# Patient Record
Sex: Female | Born: 1965
Health system: Southern US, Community
[De-identification: ages and names within clinical notes are randomized; demographics above are authoritative.]

## PROBLEM LIST (undated history)

## (undated) DIAGNOSIS — E042 Nontoxic multinodular goiter: Secondary | ICD-10-CM

## (undated) DIAGNOSIS — E049 Nontoxic goiter, unspecified: Secondary | ICD-10-CM

## (undated) DIAGNOSIS — G8929 Other chronic pain: Secondary | ICD-10-CM

## (undated) DIAGNOSIS — K625 Hemorrhage of anus and rectum: Secondary | ICD-10-CM

## (undated) DIAGNOSIS — T7840XA Allergy, unspecified, initial encounter: Secondary | ICD-10-CM

## (undated) DIAGNOSIS — T8859XA Other complications of anesthesia, initial encounter: Secondary | ICD-10-CM

## (undated) DIAGNOSIS — Z87898 Personal history of other specified conditions: Secondary | ICD-10-CM

## (undated) DIAGNOSIS — T4145XA Adverse effect of unspecified anesthetic, initial encounter: Secondary | ICD-10-CM

## (undated) DIAGNOSIS — K602 Anal fissure, unspecified: Secondary | ICD-10-CM

## (undated) DIAGNOSIS — M545 Low back pain: Secondary | ICD-10-CM

## (undated) DIAGNOSIS — Z973 Presence of spectacles and contact lenses: Secondary | ICD-10-CM

## (undated) HISTORY — PX: OTHER SURGICAL HISTORY: SHX169

## (undated) HISTORY — DX: Nontoxic multinodular goiter: E04.2

## (undated) HISTORY — DX: Nontoxic goiter, unspecified: E04.9

## (undated) HISTORY — DX: Low back pain: M54.5

## (undated) HISTORY — DX: Anal fissure, unspecified: K60.2

## (undated) HISTORY — DX: Allergy, unspecified, initial encounter: T78.40XA

---
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
## 2000-09-24 DIAGNOSIS — E049 Nontoxic goiter, unspecified: Secondary | ICD-10-CM

## 2000-09-24 HISTORY — DX: Nontoxic goiter, unspecified: E04.9

## 2003-05-07 ENCOUNTER — Encounter: Payer: Self-pay | Admitting: Family Medicine

## 2003-05-07 ENCOUNTER — Encounter: Admission: RE | Admit: 2003-05-07 | Discharge: 2003-05-07 | Payer: Self-pay | Admitting: Family Medicine

## 2004-06-01 ENCOUNTER — Other Ambulatory Visit: Admission: RE | Admit: 2004-06-01 | Discharge: 2004-06-01 | Payer: Self-pay | Admitting: Family Medicine

## 2004-06-07 ENCOUNTER — Encounter: Admission: RE | Admit: 2004-06-07 | Discharge: 2004-06-07 | Payer: Self-pay | Admitting: Family Medicine

## 2004-09-24 HISTORY — PX: TEAR DUCT PROBING: SHX793

## 2005-03-29 ENCOUNTER — Other Ambulatory Visit: Admission: RE | Admit: 2005-03-29 | Discharge: 2005-03-29 | Payer: Self-pay | Admitting: Family Medicine

## 2006-02-27 ENCOUNTER — Other Ambulatory Visit: Admission: RE | Admit: 2006-02-27 | Discharge: 2006-02-27 | Payer: Self-pay | Admitting: Family Medicine

## 2006-02-27 ENCOUNTER — Encounter: Admission: RE | Admit: 2006-02-27 | Discharge: 2006-02-27 | Payer: Self-pay | Admitting: Family Medicine

## 2006-02-27 IMAGING — CR DG SHOULDER 2+V*R*
3 series · 3 of 3 positions shown · non-contrast
Comparison: none

CLINICAL DATA: Right shoulder pain.
 THREE VIEWS, RIGHT SHOULDER:
 Mild AC joint degenerative change.  Glenohumeral alignment anatomic.  The glenohumeral articulation is unremarkable.

[view not recorded (1 of 3)]
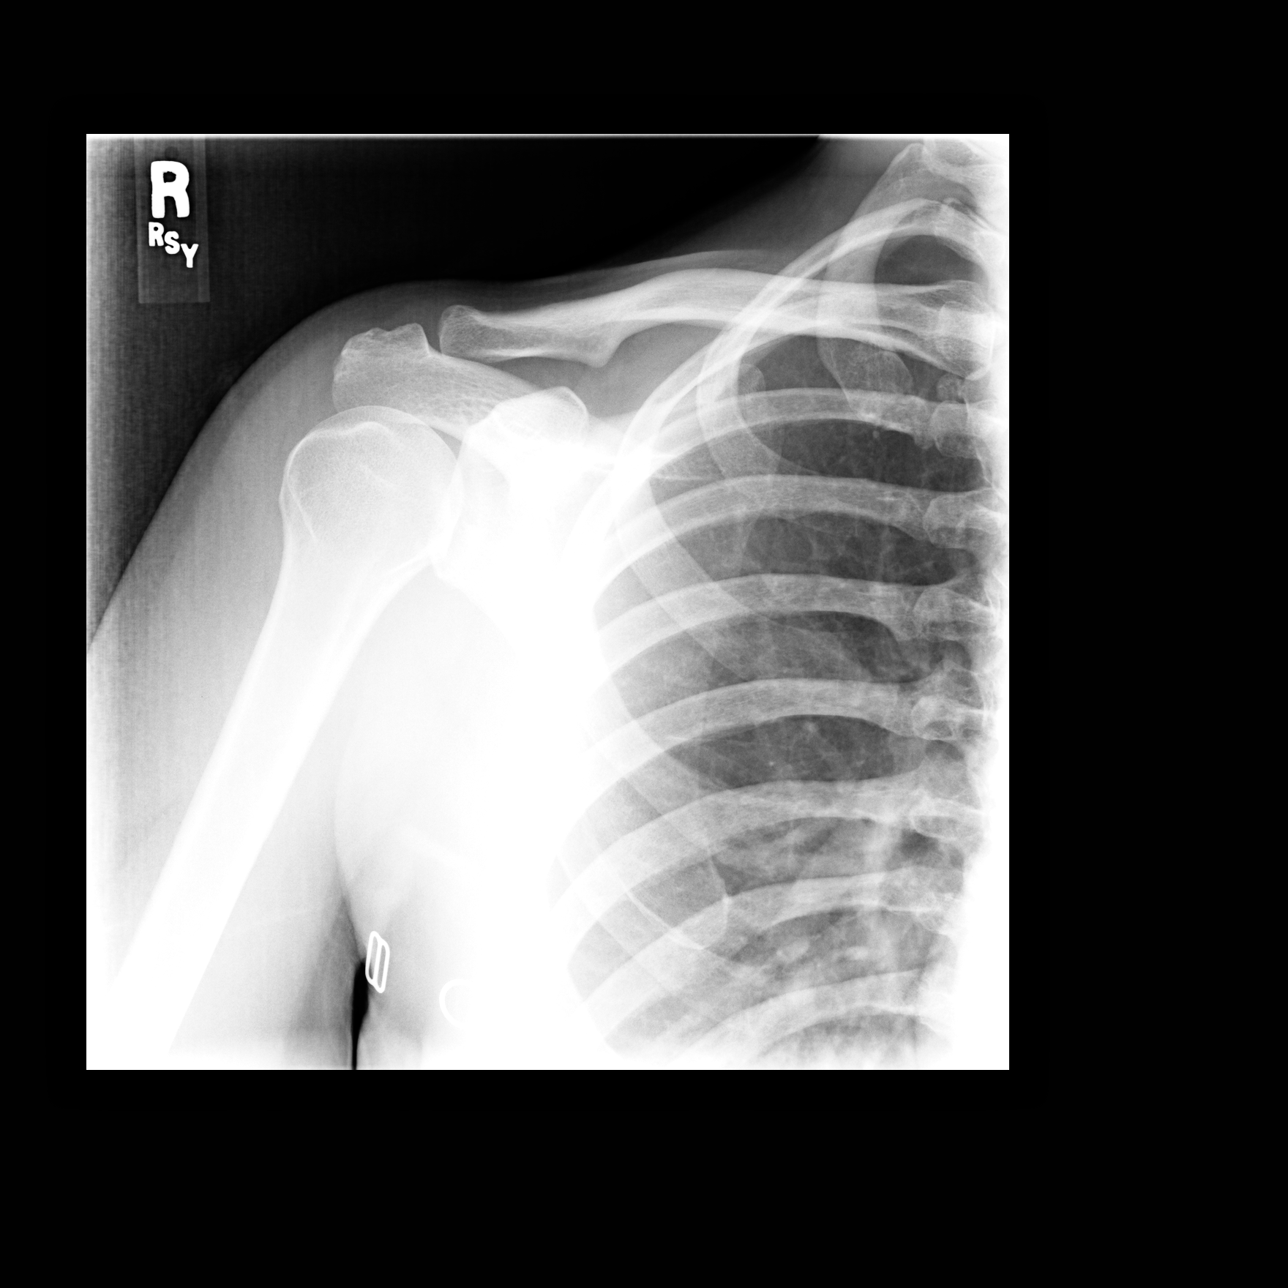

[view not recorded (2 of 3)]
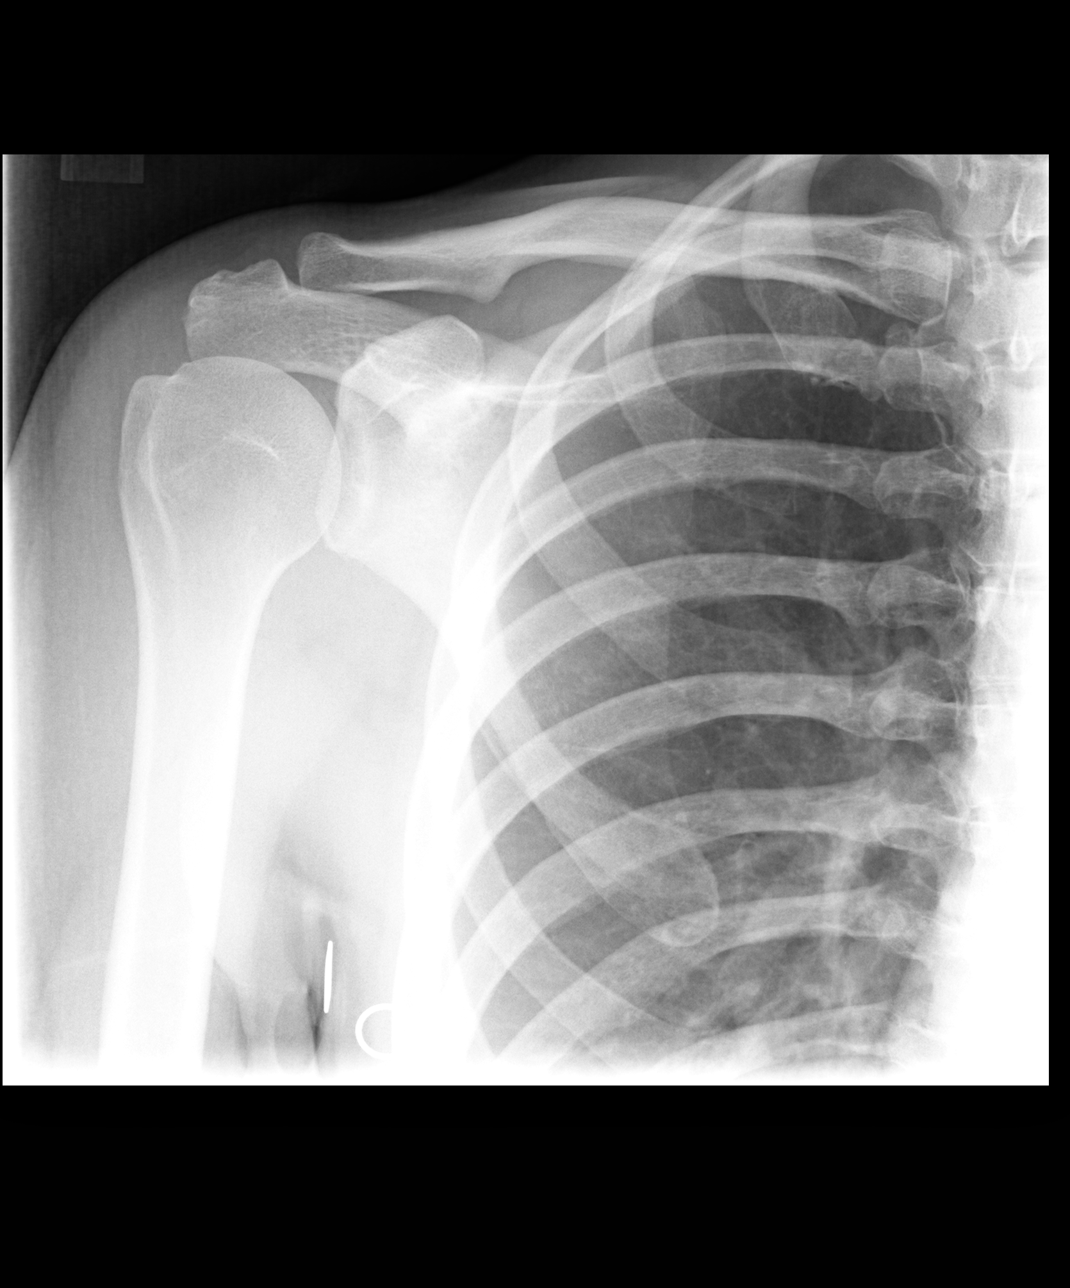

[view not recorded (3 of 3)]
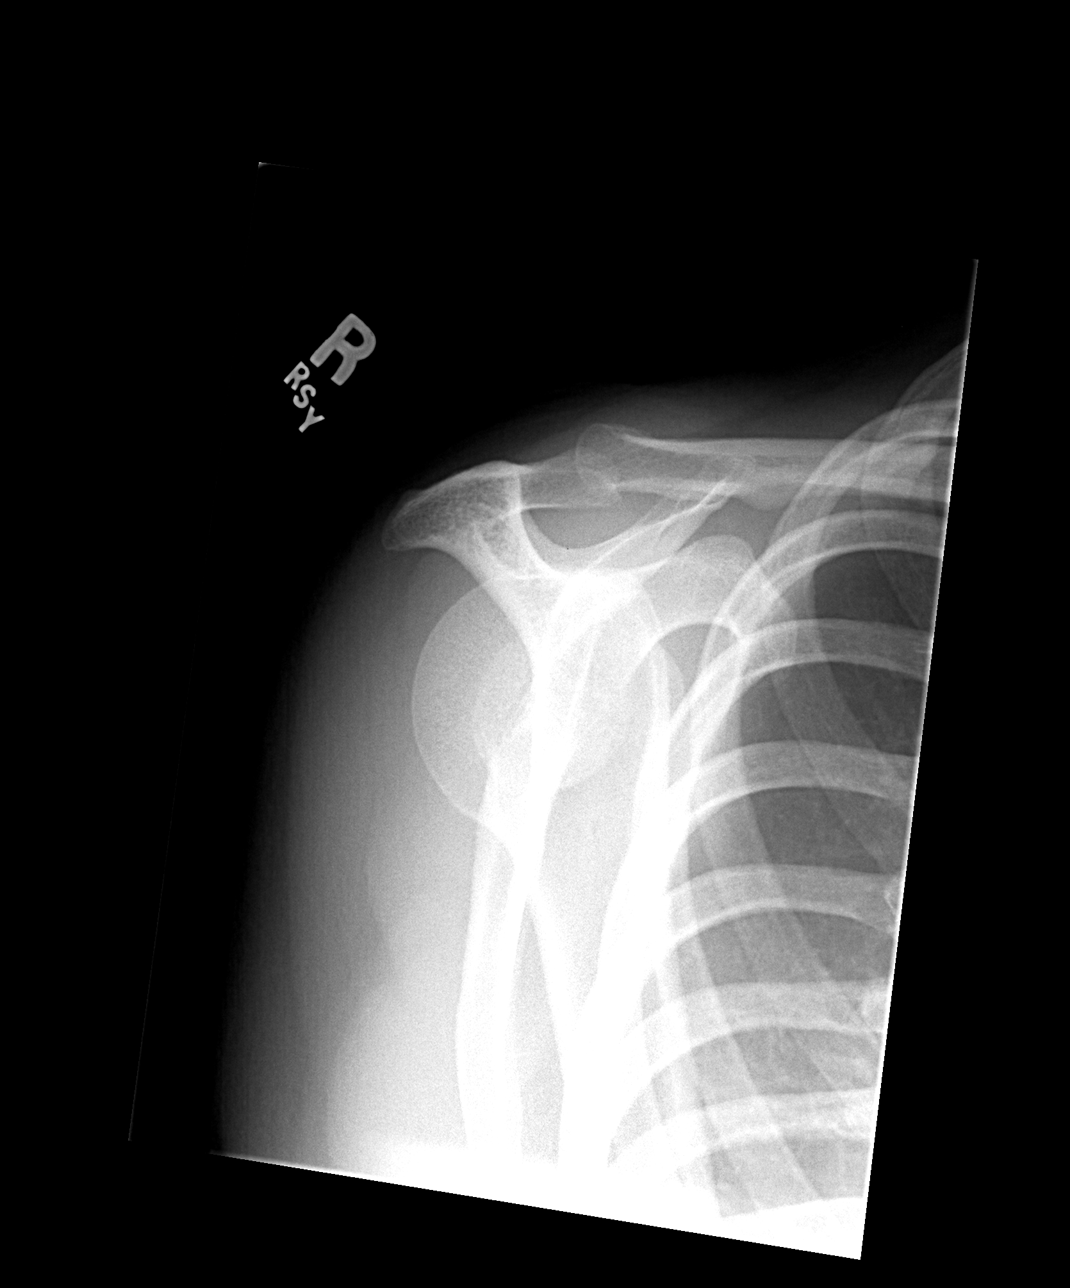

[3 of 3 positions shown; findings below may reference images not displayed]

IMPRESSION: AC joint degenerative change.

## 2006-03-06 ENCOUNTER — Encounter: Admission: RE | Admit: 2006-03-06 | Discharge: 2006-06-04 | Payer: Self-pay | Admitting: Family Medicine

## 2006-03-15 ENCOUNTER — Encounter: Admission: RE | Admit: 2006-03-15 | Discharge: 2006-03-15 | Payer: Self-pay | Admitting: Family Medicine

## 2007-02-25 ENCOUNTER — Other Ambulatory Visit: Admission: RE | Admit: 2007-02-25 | Discharge: 2007-02-25 | Payer: Self-pay | Admitting: Family Medicine

## 2007-03-17 ENCOUNTER — Encounter: Admission: RE | Admit: 2007-03-17 | Discharge: 2007-03-17 | Payer: Self-pay | Admitting: Family Medicine

## 2008-03-17 ENCOUNTER — Encounter: Admission: RE | Admit: 2008-03-17 | Discharge: 2008-03-17 | Payer: Self-pay | Admitting: Family Medicine

## 2008-03-17 IMAGING — US US SOFT TISSUE HEAD/NECK
1 series · 14 of 25 positions shown · non-contrast
Comparison: None

CLINICAL DATA: Evaluate for multinodular goiter

THYROID ULTRASOUND
TECHNIQUE: Ultrasound examination of the thyroid gland and all
adjacent soft tissues was performed.

[Series 1: us soft tissue head/neck · 0.08mm/px · 14 of 47 slices shown]
[im 1/47]
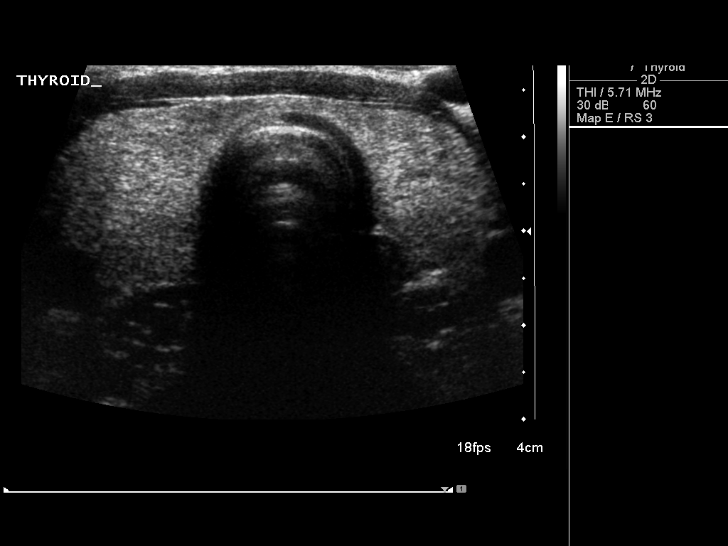
[im 4/47]
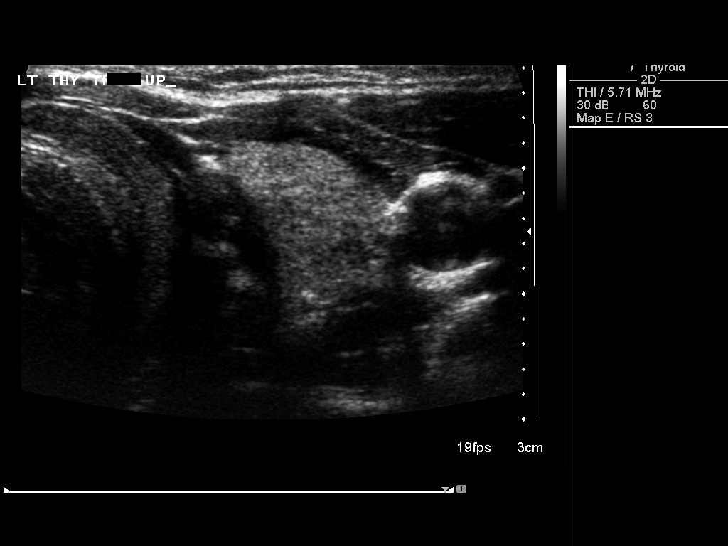
[im 8/47]
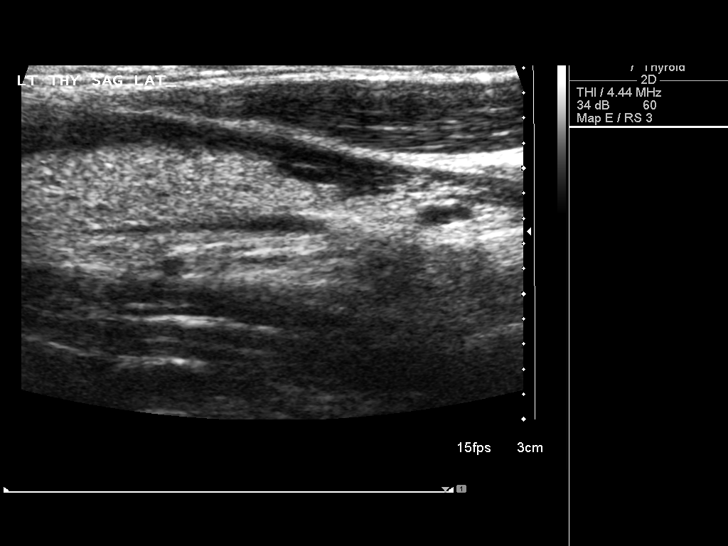
[im 12/47]
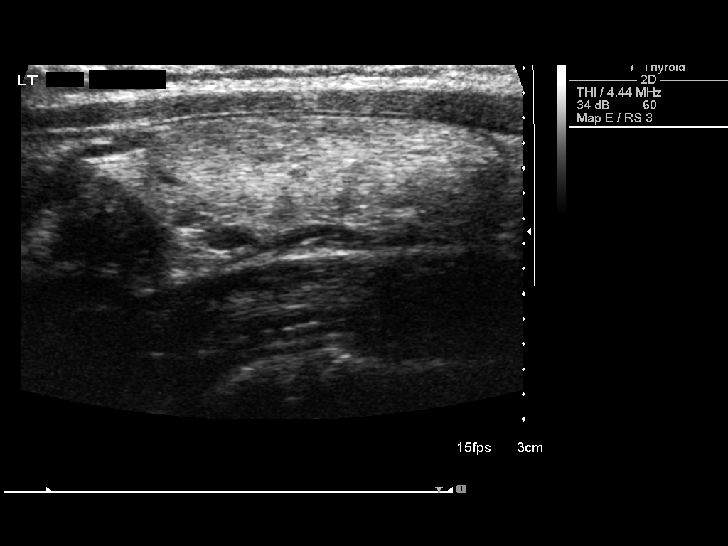
[im 16/47]
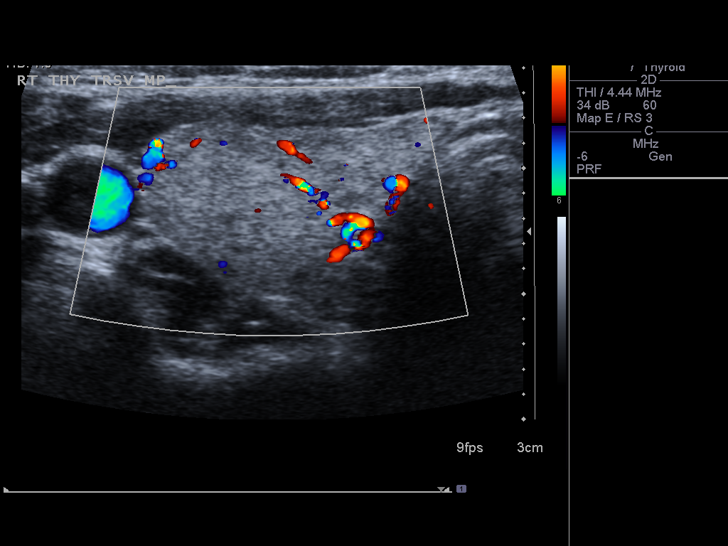
[im 18/47]
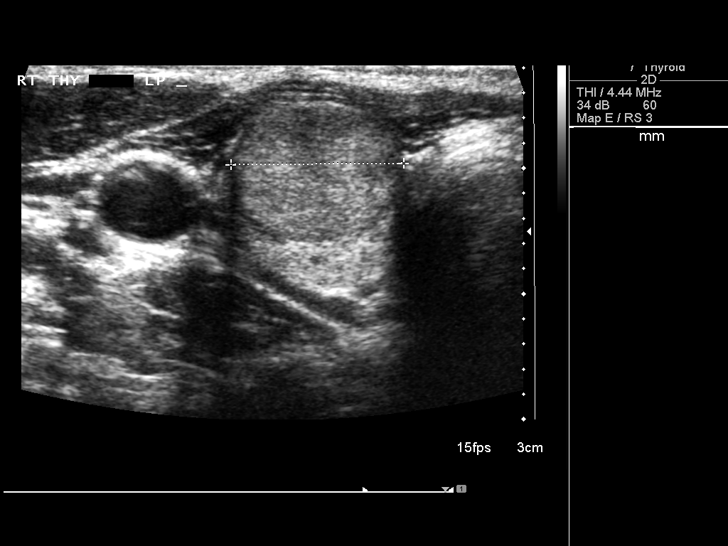
[im 22/47]
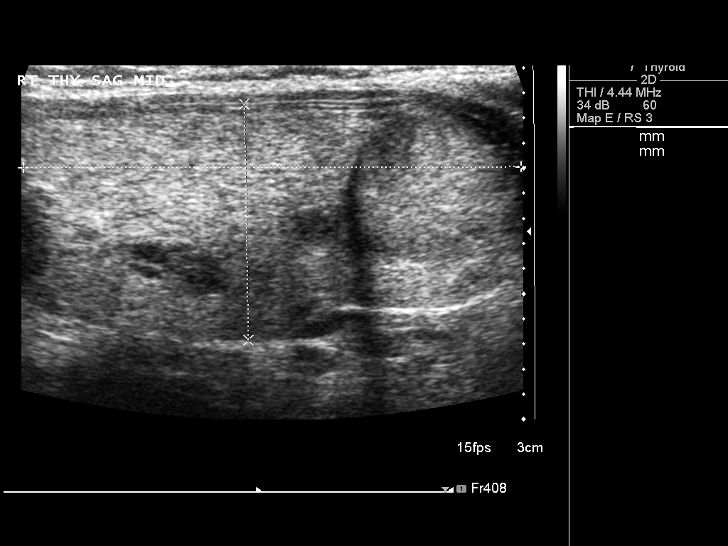
[im 25/47]
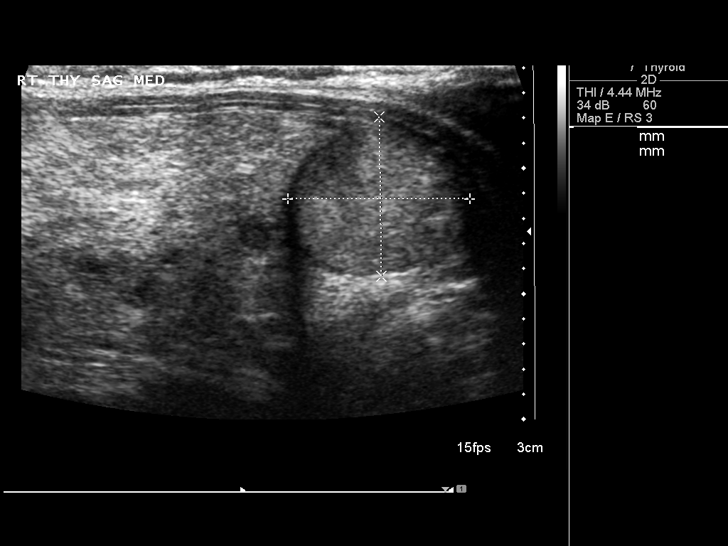
[im 29/47]
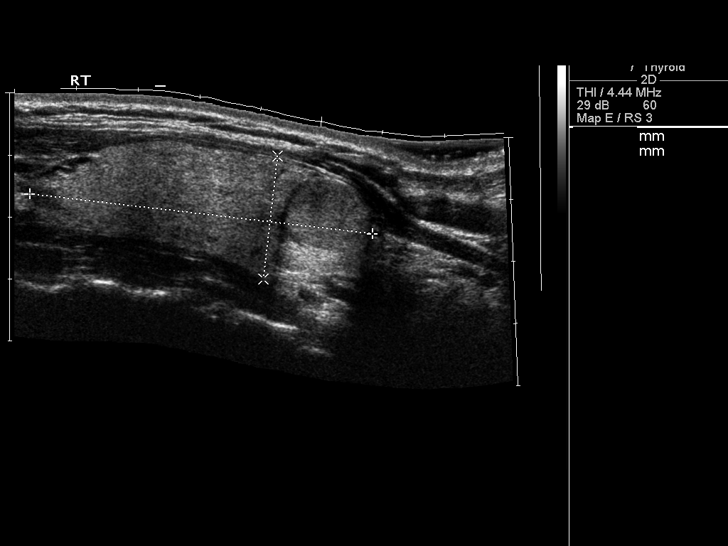
[im 31/47]
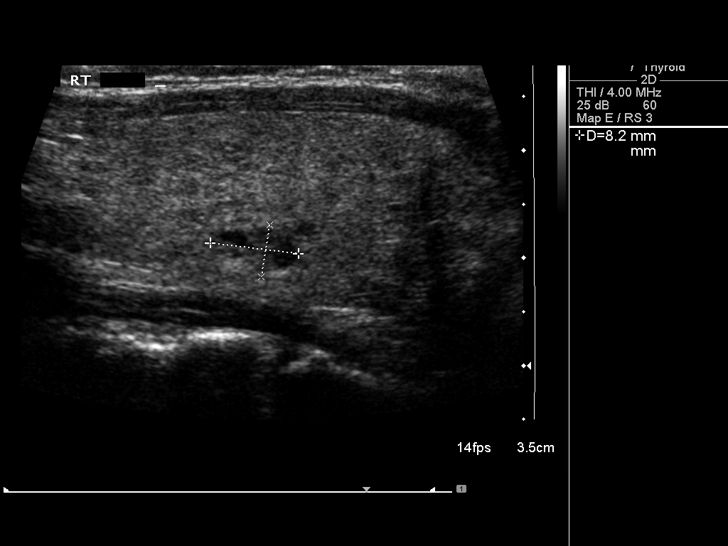
[im 35/47]
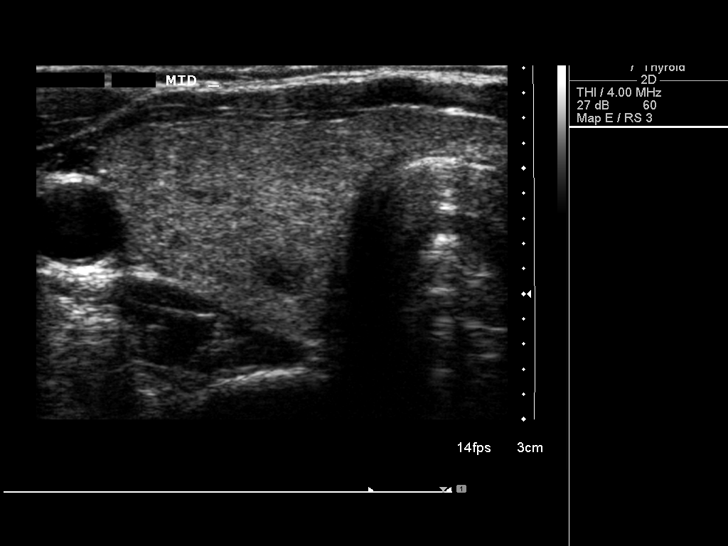
[im 39/47]
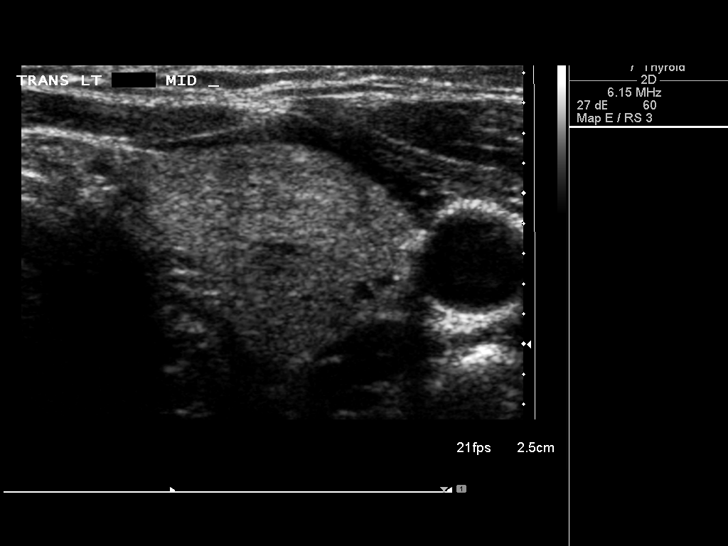
[im 43/47]
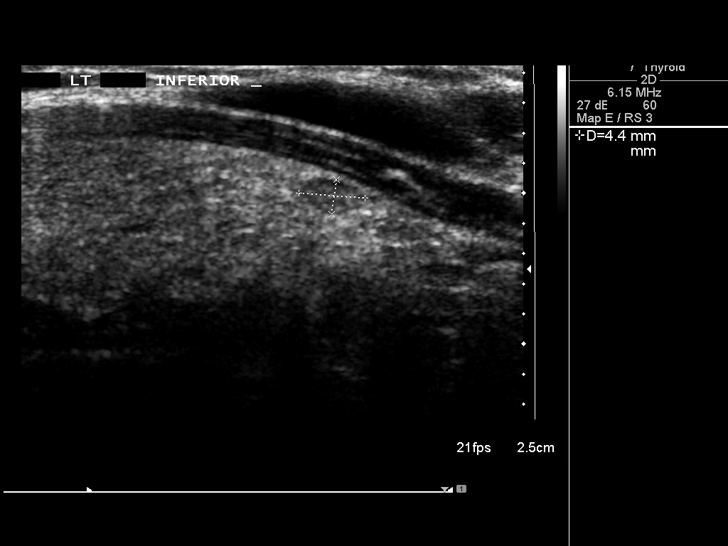
[im 47/47]
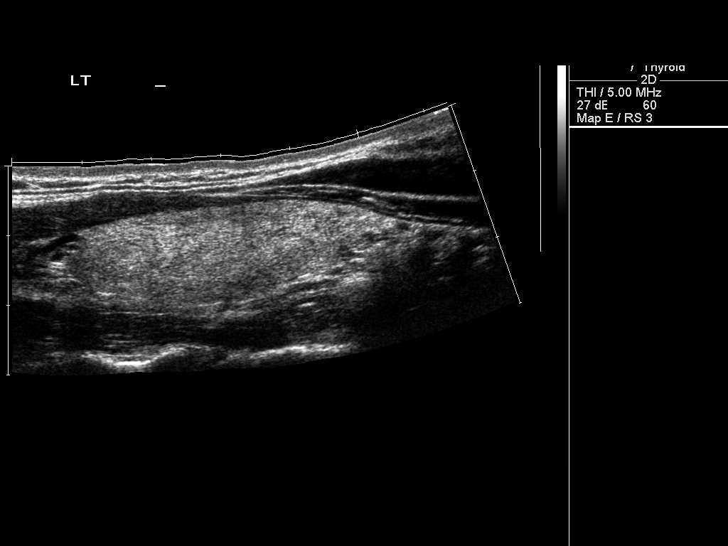

[14 of 25 positions shown; findings below may reference images not displayed]

FINDINGS: The right lobe of the thyroid gland measures 5.6 x 2.0 x
2.1 cm.

The left lobe measures 4.5 x 1.4 x 1.7 cm.

The isthmus measures 2.2 cm.

The thyroid echotexture is inhomogeneous with multifocal lesions.

Two small nodules in the left lobe measure up to 4 mm.

There are multiple small nodules within the interpolar region of
the right lobe.  Within the lower pole of the right lobe there is a
dominant, solid nodule which measures 1.5 x 1.3 x 1.4 cm.
IMPRESSION: 1.  Multinodular thyroid gland.
2.  Dominant solid nodule within the lower pole of the right lobe.
A percutaneous biopsy of  this nodule advised.

## 2008-04-14 ENCOUNTER — Other Ambulatory Visit: Admission: RE | Admit: 2008-04-14 | Discharge: 2008-04-14 | Payer: Self-pay | Admitting: Obstetrics and Gynecology

## 2008-09-24 HISTORY — PX: OTHER SURGICAL HISTORY: SHX169

## 2009-03-18 ENCOUNTER — Encounter: Admission: RE | Admit: 2009-03-18 | Discharge: 2009-03-18 | Payer: Self-pay | Admitting: Family Medicine

## 2009-06-15 ENCOUNTER — Encounter: Admission: RE | Admit: 2009-06-15 | Discharge: 2009-06-15 | Payer: Self-pay | Admitting: Internal Medicine

## 2009-06-15 IMAGING — US US SOFT TISSUE HEAD/NECK
1 series · 14 of 23 positions shown · non-contrast
Comparison: [DATE]

CLINICAL DATA: Follow-up thyroid goiter.

THYROID ULTRASOUND
TECHNIQUE: Ultrasound examination of the thyroid gland and
adjacent soft tissues was performed.

[Series 1: us soft tissue head/neck · 0.07mm/px · 14 of 23 slices shown]
[im 1/23]
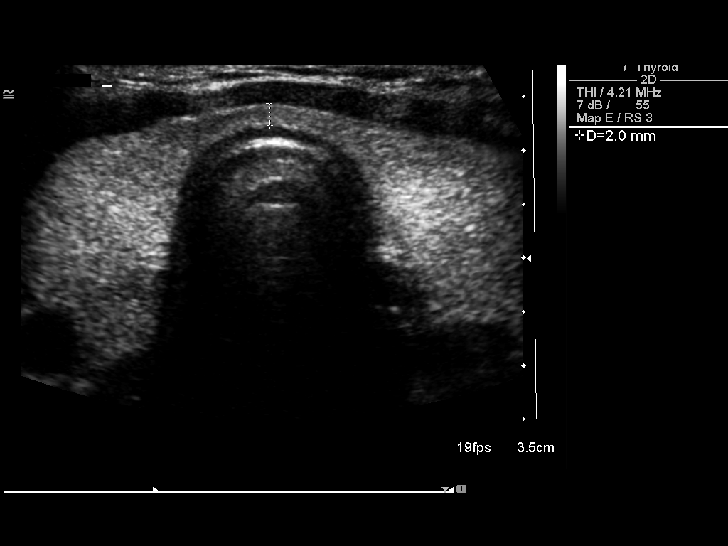
[im 3/23]
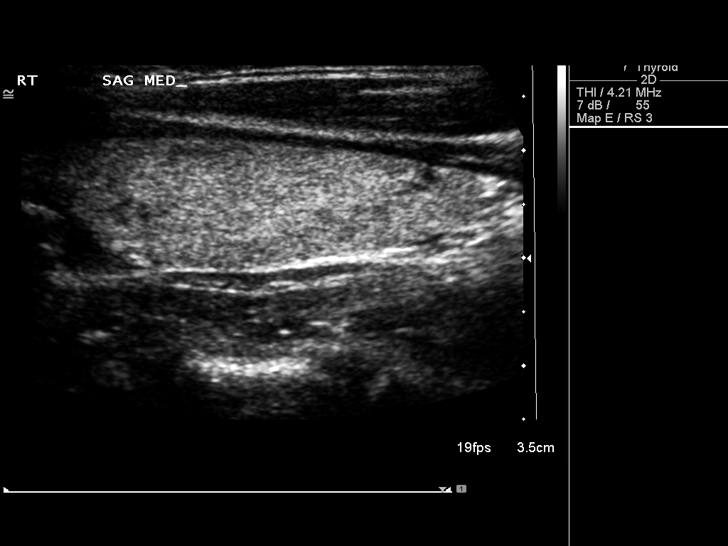
[im 5/23]
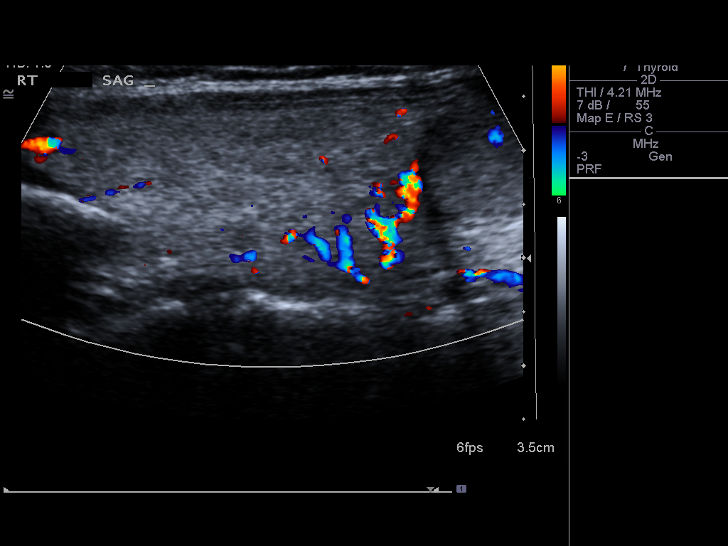
[im 6/23]
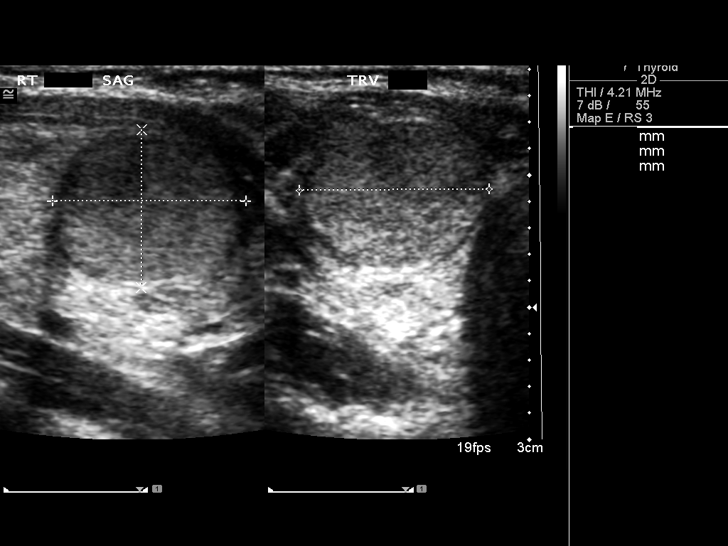
[im 8/23]
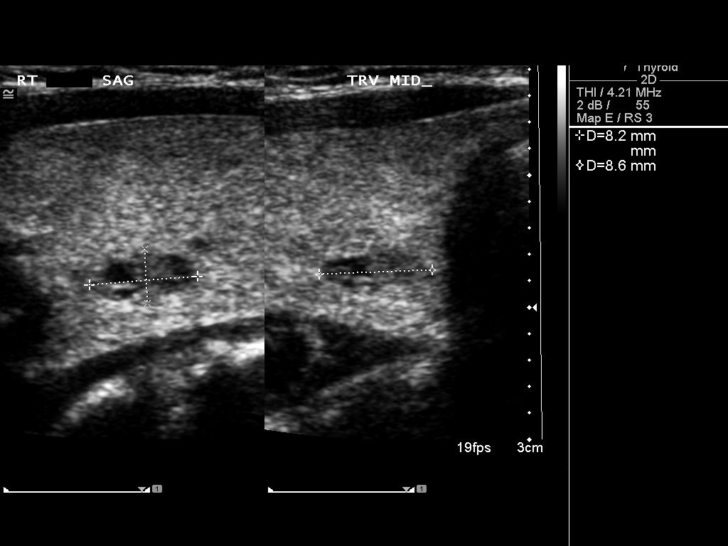
[im 10/23]
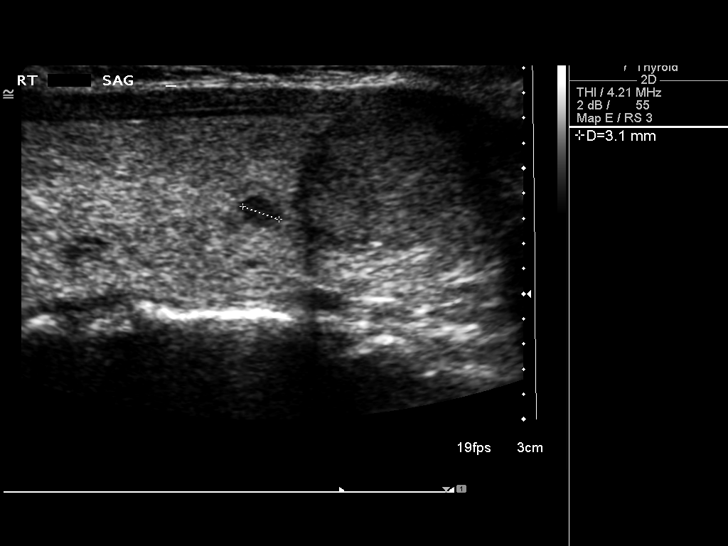
[im 11/23]
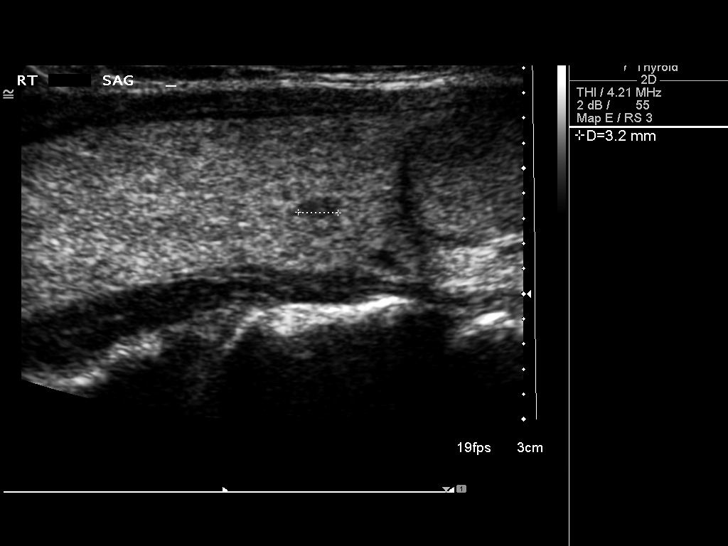
[im 13/23]
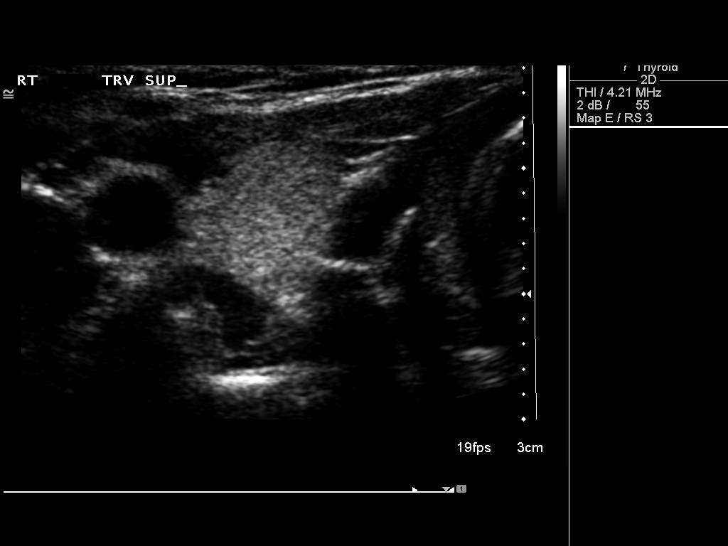
[im 14/23]
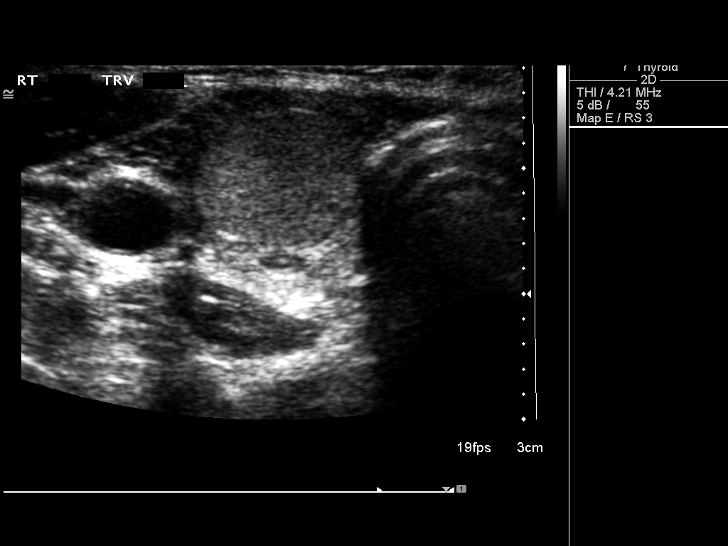
[im 16/23]
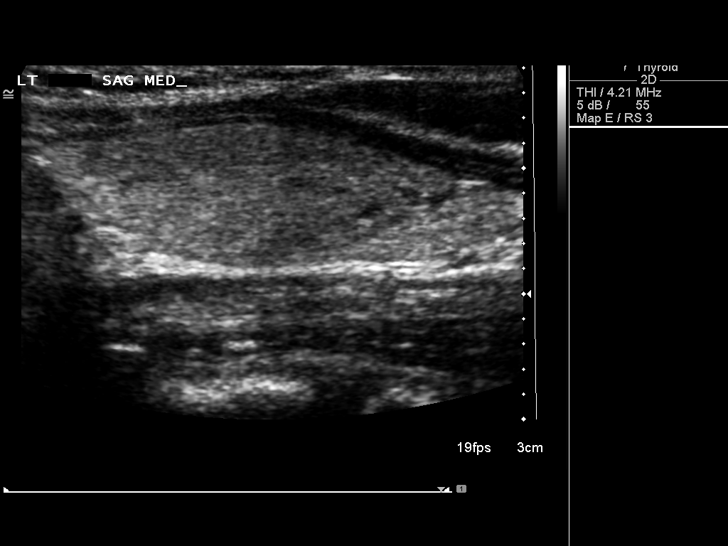
[im 18/23]
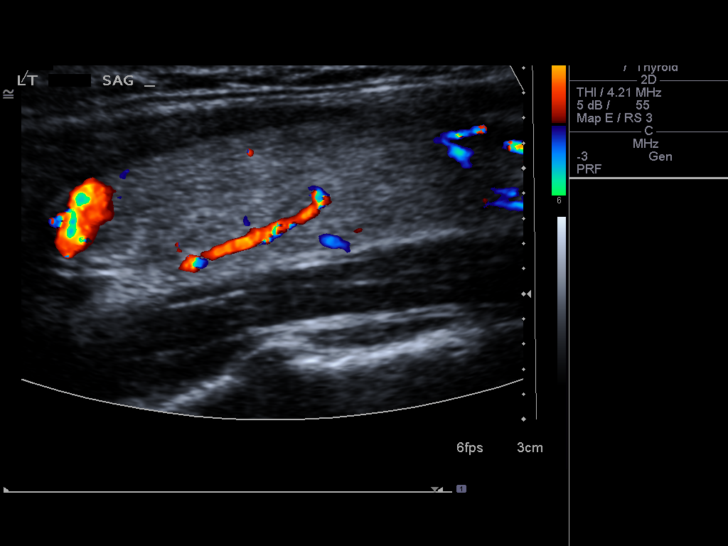
[im 19/23]
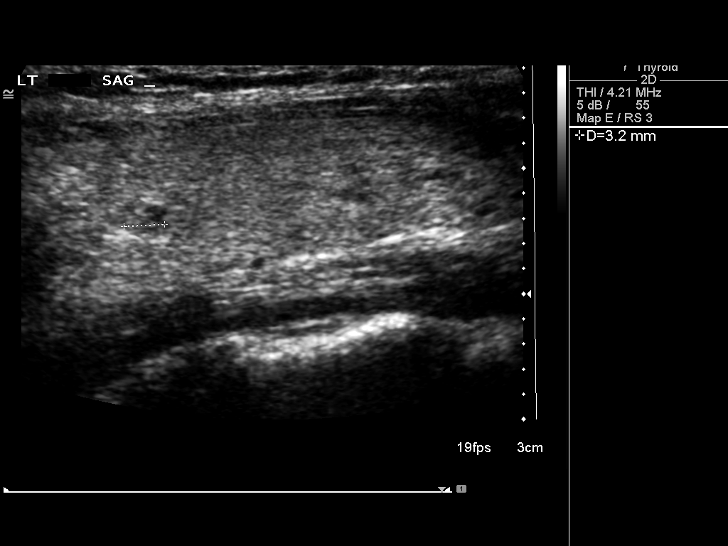
[im 21/23]
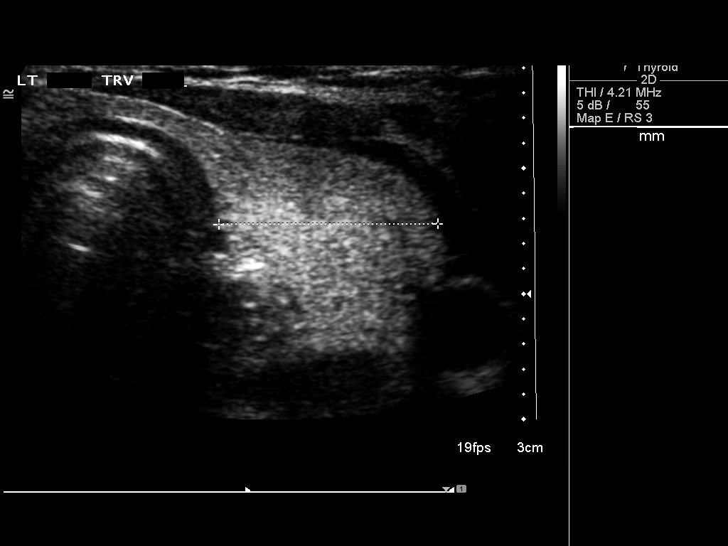
[im 23/23]
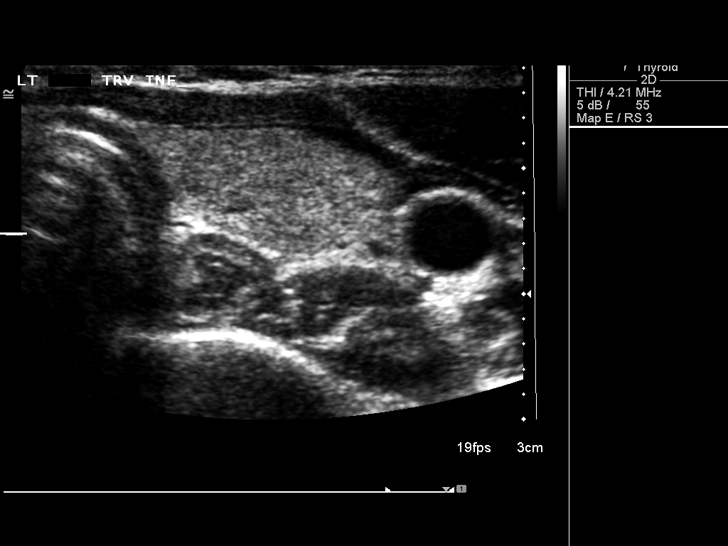

[14 of 23 positions shown; findings below may reference images not displayed]

FINDINGS: Right lobe of the thyroid measures 5.5 x 1.8 x 1.9 cm and
left lobe, 4.5 x 1.3 x 1.7 cm.  Isthmus measures 2 mm.  Thyroid
echotexture is heterogeneous.  A solid nodule in the lower pole of
the right thyroid measures 1.5 x 1.2 x 1.4 cm, stable.  A smaller
complex appearing nodule in the mid pole of the right thyroid
measures 0.8 x 0.4 x 0.9 cm, stable.  Additional nodules are seen
in the thyroid bilaterally, measuring 4 mm or less in size.
IMPRESSION: Nodular thyroid, stable.

## 2009-06-22 ENCOUNTER — Other Ambulatory Visit: Admission: RE | Admit: 2009-06-22 | Discharge: 2009-06-22 | Payer: Self-pay | Admitting: Family Medicine

## 2010-02-22 LAB — HM PAP SMEAR: HM Pap smear: NORMAL

## 2010-03-24 HISTORY — PX: COLONOSCOPY: SHX174

## 2010-03-31 ENCOUNTER — Encounter: Admission: RE | Admit: 2010-03-31 | Discharge: 2010-03-31 | Payer: Self-pay | Admitting: Family Medicine

## 2010-10-25 DIAGNOSIS — M545 Low back pain, unspecified: Secondary | ICD-10-CM

## 2010-10-25 HISTORY — DX: Low back pain, unspecified: M54.50

## 2011-03-20 ENCOUNTER — Other Ambulatory Visit: Payer: Self-pay | Admitting: Family Medicine

## 2011-03-20 DIAGNOSIS — Z1231 Encounter for screening mammogram for malignant neoplasm of breast: Secondary | ICD-10-CM

## 2011-04-02 ENCOUNTER — Ambulatory Visit: Payer: Self-pay

## 2011-04-13 ENCOUNTER — Ambulatory Visit: Payer: Self-pay

## 2011-04-13 ENCOUNTER — Ambulatory Visit
Admission: RE | Admit: 2011-04-13 | Discharge: 2011-04-13 | Disposition: A | Discharge status: Home / Self Care | Payer: Managed Care, Other (non HMO) | Source: Ambulatory Visit | Attending: Family Medicine | Admitting: Family Medicine

## 2011-04-13 DIAGNOSIS — Z1231 Encounter for screening mammogram for malignant neoplasm of breast: Secondary | ICD-10-CM

## 2011-04-19 LAB — HM MAMMOGRAPHY: HM Mammogram: NORMAL

## 2012-01-17 ENCOUNTER — Encounter: Payer: Managed Care, Other (non HMO) | Admitting: Family Medicine

## 2012-02-04 ENCOUNTER — Encounter: Payer: Self-pay | Admitting: Family Medicine

## 2012-02-04 ENCOUNTER — Other Ambulatory Visit (HOSPITAL_COMMUNITY)
Admission: RE | Admit: 2012-02-04 | Discharge: 2012-02-04 | Disposition: A | Discharge status: Home / Self Care | Payer: Managed Care, Other (non HMO) | Source: Ambulatory Visit | Attending: Family Medicine | Admitting: Family Medicine

## 2012-02-04 ENCOUNTER — Ambulatory Visit (INDEPENDENT_AMBULATORY_CARE_PROVIDER_SITE_OTHER): Payer: Managed Care, Other (non HMO) | Admitting: Family Medicine

## 2012-02-04 VITALS — BP 116/80 | HR 68 | Ht 68.0 in | Wt 160.0 lb

## 2012-02-04 DIAGNOSIS — N841 Polyp of cervix uteri: Secondary | ICD-10-CM

## 2012-02-04 DIAGNOSIS — Z79899 Other long term (current) drug therapy: Secondary | ICD-10-CM

## 2012-02-04 DIAGNOSIS — M545 Low back pain, unspecified: Secondary | ICD-10-CM

## 2012-02-04 DIAGNOSIS — Z01419 Encounter for gynecological examination (general) (routine) without abnormal findings: Secondary | ICD-10-CM | POA: Insufficient documentation

## 2012-02-04 DIAGNOSIS — Z0000 Encounter for general adult medical examination without abnormal findings: Secondary | ICD-10-CM

## 2012-02-04 LAB — GLUCOSE, RANDOM: Glucose, Bld: 86 mg/dL (ref 70–99)

## 2012-02-04 LAB — HEPATIC FUNCTION PANEL
ALT: 16 U/L (ref 0–35)
AST: 21 U/L (ref 0–37)
Albumin: 4.6 g/dL (ref 3.5–5.2)
Alkaline Phosphatase: 69 U/L (ref 39–117)
Total Protein: 7.3 g/dL (ref 6.0–8.3)

## 2012-02-04 LAB — POCT URINALYSIS DIPSTICK
Blood, UA: NEGATIVE
Ketones, UA: NEGATIVE
Protein, UA: NEGATIVE
Spec Grav, UA: <=1.005
Urobilinogen, UA: NEGATIVE
pH, UA: 6

## 2012-02-04 NOTE — Patient Instructions (Addendum)
HEALTH MAINTENANCE RECOMMENDATIONS:  It is recommended that you get at least 30 minutes of aerobic exercise at least 5 days/week (for weight loss, you may need as much as 60-90 minutes). This can be any activity that gets your heart rate up. This can be divided in 10-15 minute intervals if needed, but try and build up your endurance at least once a week.  Weight bearing exercise is also recommended twice weekly.  Eat a healthy diet with lots of vegetables, fruits and fiber.  "Colorful" foods have a lot of vitamins (ie green vegetables, tomatoes, red peppers, etc).  Limit sweet tea, regular sodas and alcoholic beverages, all of which has a lot of calories and sugar.  Up to 1 alcoholic drink daily may be beneficial for women (unless trying to lose weight, watch sugars).  Drink a lot of water.  Calcium recommendations are 1200-1500 mg daily (1500 mg for postmenopausal women or women without ovaries), and vitamin D 1000 IU daily.  This should be obtained from diet and/or supplements (vitamins), and calcium should not be taken all at once, but in divided doses.  Monthly self breast exams and yearly mammograms for women over the age of 60 is recommended.  Sunscreen of at least SPF 30 should be used on all sun-exposed parts of the skin when outside between the hours of 10 am and 4 pm (not just when at beach or pool, but even with exercise, golf, tennis, and yard work!)  Use a sunscreen that says "broad spectrum" so it covers both UVA and UVB rays, and make sure to reapply every 1-2 hours.  Remember to change the batteries in your smoke detectors when changing your clock times in the spring and fall.  Use your seat belt every time you are in a car, and please drive safely and not be distracted with cell phones and texting while driving.  Your have a large polyp at the cervical os, that bled when the pap was obtained.  We will contact you with the pap results in the next 1-2 weeks when the results come back,  and refer you to a gynecologist to have it removed.  Check with friends regarding whether or not you have a preferred GYN to be referred to.

## 2012-02-04 NOTE — Progress Notes (Signed)
Barbara Oconnor is a 46 y.o. female who presents for a complete physical.  She has the following concerns:  Back pain.  Began with lower abdominal cramps 10/2010.  Had pelvic u/s, labs, CT with no known cause.  RLQ pain resolved, but has had persistent back pain since then.  Tried massage, PT.  Saw ortho who did x-rays and sent her to chiropractor x 20 visits.  Has also seen accupuncturist.  Followed up with ortho, did MRI, who again recommended chiropractor.  MRI from [facility] (10/2011) showed shallow broad-based lateral foraminal and extraforaminal disc protrusions on the L at L3-4; annular rent and a small central disc protrusion at L4-5. Does pilates regularly (teaches).  Pain with sitting longterm, standing, grocery shopping.  Feels better to lie on floor, but not better lying in bed. Flipped mattress, which is firmer, and is somewhat better. Pain is lower back, sometimes SI joint, and in soft tissue areas, obliques.   Has tried NSAIDs in the past, which didn't help, and ibuprofen bothered her stomach. Didn't seem to help with pain, and caused nausea. 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  Stretching, heat and ice help some. Also recalls having tried muscle relaxants.  No previous records are available for review.  Patient brought in some records (MRI, last labs).  Sees dermatologist for her toes.  She is on a weekly medication for a year, that requires liver testing--would like it done today. She doesn't recall the name of the medication, but was told that her fungal culture was negative.  Health Maintenance: Immunization History  Administered Date(s) Administered  . Influenza Split 06/25/2011  She states tetanus is UTD, and reports having had TdaP Last Pap smear: 2 years ago Last mammogram: 03/2011 Last colonoscopy: 03/2010 Last DEXA: never Dentist: every 6 months Ophtho: 2 years ago Exercise:  Teaches pilates once a week, and yardwork  Labs: 03/2011  chol 168, TG 67, HDL 69, LDL 86  Past Medical History  Diagnosis Date  . Low back pain 10/2010  . Goiter 2002    u/s 2002, 2010 (Dr. Sharl Ma)  . Multiple thyroid nodules     Past Surgical History  Procedure Date  . Tonsillectomy 1986  . Tear duct probing 2006    left  . Hemorrhoid banding   . Colonoscopy 03/2010    Kathryne Sharper); benign polyp, told to repeat 10 years    History   Social History  . Marital Status: Married    Spouse Name: N/A    Number of Children: 2  . Years of Education: N/A   Occupational History  . substitute Runner, broadcasting/film/video; also teaches pilates [employer]   Social History Main Topics  . Smoking status: Never Smoker   . Smokeless tobacco: Never Used  . Alcohol Use: Yes     1 glass per evening.  . Drug Use: No  . Sexually Active: Yes -- Female partner(s)    Birth Control/ Protection: Surgical     husband had vasectomy   Other Topics Concern  . Not on file   Social History Narrative   Lives with husband and 1 son, 1 daughter. No pets    Family History  Problem Relation Age of Onset  . Cancer Mother 35    breast cancer  . Depression Mother   . Hyperlipidemia Father   . Hypertension Father   . Bipolar disorder Sister   . Cancer Maternal Aunt     thyroid cancer  . Cancer Paternal Uncle  lymphatic  . Diabetes Paternal Grandmother   . Heart disease Paternal Grandfather     Current outpatient prescriptions:Krill Oil 300 MG CAPS, Take 1 capsule by mouth daily., Disp: , Rfl: ;  loratadine (CLARITIN) 10 MG tablet, Take 10 mg by mouth daily., Disp: , Rfl: ;  Sennosides (SENNA LAX PO), Take 1 tablet by mouth once a week., Disp: , Rfl:   No Known Allergies  ROS:  The patient denies anorexia, fever, weight changes, headaches,  vision changes, decreased hearing, ear pain, sore throat, breast concerns, chest pain, palpitations, dizziness, syncope, dyspnea on exertion, cough, swelling, nausea, vomiting, diarrhea, constipation, abdominal pain,  melena, hematochezia, indigestion/heartburn, hematuria, incontinence, dysuria, vaginal discharge, odor or itch, genital lesions, joint pains, numbness, tingling, weakness, tremor, suspicious skin lesions, depression, anxiety, abnormal bleeding/bruising, or enlarged lymph nodes. Cycles are irregular, sometimes last longer when longer time between cycles.  Denies heavy clotting, just some prolonged, lighter bleeding at times.  Occasional spotting with intercourse, not consistent.  PHYSICAL EXAM:  BP 116/80  Pulse 68  Ht 5\' 8"  (1.727 m)  Wt 160 lb (72.576 kg)  BMI 24.33 kg/m2  LMP 01/04/2012  General Appearance:    Alert, cooperative, no distress, appears stated age.  Periodically shifting on exam table due to back discomfort  Head:    Normocephalic, without obvious abnormality, atraumatic  Eyes:    PERRL, conjunctiva/corneas clear, EOM's intact, fundi    benign  Ears:    Normal TM's and external ear canals  Nose:   Nares normal, mucosa normal, no drainage or sinus   tenderness  Throat:   Lips, mucosa, and tongue normal; teeth and gums normal  Neck:   Supple, no lymphadenopathy;  thyroid: Small thyroid nodule R lower lobe, nontender. no carotid   bruit or JVD  Back:    Spine nontender, no curvature, ROM normal, no CVA     Tenderness. SI nontender, but area of her pain is R SI joint.  Lungs:     Clear to auscultation bilaterally without wheezes, rales or     ronchi; respirations unlabored  Chest Wall:    No tenderness or deformity   Heart:    Regular rate and rhythm, S1 and S2 normal, no murmur, rub   or gallop  Breast Exam:    No tenderness, masses, or nipple discharge or inversion.      No axillary lymphadenopathy  Abdomen:     Soft, non-tender, nondistended, normoactive bowel sounds,    no masses, no hepatosplenomegaly  Genitalia:    Normal external genitalia without lesions.  BUS and vagina normal; cervix with large endocervical polyp, friable with pap smear.  No cervical motion  tenderness. No abnormal vaginal discharge.  Uterus and adnexa not enlarged, nontender, no masses.  Pap performed  Rectal:    Normal tone, no masses or tenderness; guaiac negative stool  Extremities:   No clubbing, cyanosis or edema  Pulses:   2+ and symmetric all extremities  Skin:   Skin color, texture, turgor normal, no rashes or lesions  Lymph nodes:   Cervical, supraclavicular, and axillary nodes normal  Neurologic:   CNII-XII intact, normal strength, sensation and gait; reflexes 2+ and symmetric throughout          Psych:   Normal mood, affect, hygiene and grooming.     ASSESSMENT/PLAN: 1. Routine general medical examination at a health care facility  POCT Urinalysis Dipstick, Glucose, random, Cytology - PAP  2. Encounter for long-term (current) use of other medications  Hepatic function panel  3. Endocervical polyp    4. Lumbago      Large, friable endocervical polyp. Pap obtained--plan to refer to GYN regardless of whether results or abnormal or not--await results Today check Glu (since labs nonfasting at work), and LFT's (for pt to send to her derm)  Thyroid nodule--small unchanged per pt.  Pt euthyroid by history, and last TSH completely normal.  LBP--discussed heat, stretches, rest, massage, NSAIDs, muscle relaxants, PT vs chiro.  Given that she has tried most of these treatment, no specific recommendations made today.  F/u with ortho if needed, and chiro as scheduled.  Pt hesitant to use any NSAIDs at this time  Discussed monthly self breast exams and yearly mammograms after the age of 53; at least 30 minutes of aerobic activity at least 5 days/week; proper sunscreen use reviewed; healthy diet, including goals of calcium and vitamin D intake and alcohol recommendations (less than or equal to 1 drink/day) reviewed; regular seatbelt use; changing batteries in smoke detectors.  Immunization recommendations discussed--UTD by history.  Colonoscopy recommendations reviewed

## 2012-02-05 ENCOUNTER — Encounter: Payer: Self-pay | Admitting: Family Medicine

## 2012-02-07 ENCOUNTER — Encounter: Payer: Self-pay | Admitting: *Deleted

## 2012-02-19 ENCOUNTER — Telehealth: Payer: Self-pay | Admitting: Internal Medicine

## 2012-02-19 NOTE — Telephone Encounter (Signed)
faxed

## 2012-04-09 ENCOUNTER — Other Ambulatory Visit: Payer: Self-pay | Admitting: Family Medicine

## 2012-04-09 DIAGNOSIS — Z1231 Encounter for screening mammogram for malignant neoplasm of breast: Secondary | ICD-10-CM

## 2012-04-28 ENCOUNTER — Ambulatory Visit
Admission: RE | Admit: 2012-04-28 | Discharge: 2012-04-28 | Disposition: A | Discharge status: Home / Self Care | Payer: Managed Care, Other (non HMO) | Source: Ambulatory Visit | Attending: Family Medicine | Admitting: Family Medicine

## 2012-04-28 DIAGNOSIS — Z1231 Encounter for screening mammogram for malignant neoplasm of breast: Secondary | ICD-10-CM

## 2013-05-01 ENCOUNTER — Other Ambulatory Visit: Payer: Self-pay | Admitting: Obstetrics and Gynecology

## 2013-05-01 LAB — HM PAP SMEAR: HM Pap smear: NEGATIVE

## 2013-06-04 ENCOUNTER — Encounter: Payer: Self-pay | Admitting: *Deleted

## 2013-07-23 ENCOUNTER — Encounter: Payer: Self-pay | Admitting: Internal Medicine

## 2013-07-23 ENCOUNTER — Encounter: Payer: Self-pay | Admitting: Family Medicine

## 2013-07-23 ENCOUNTER — Ambulatory Visit (INDEPENDENT_AMBULATORY_CARE_PROVIDER_SITE_OTHER): Payer: Managed Care, Other (non HMO) | Admitting: Family Medicine

## 2013-07-23 VITALS — BP 104/68 | HR 68 | Ht 68.0 in | Wt 165.0 lb

## 2013-07-23 DIAGNOSIS — Z0000 Encounter for general adult medical examination without abnormal findings: Secondary | ICD-10-CM

## 2013-07-23 LAB — CBC WITH DIFFERENTIAL/PLATELET
Basophils Relative: 0 % (ref 0–1)
Eosinophils Relative: 1 % (ref 0–5)
HCT: 40.5 % (ref 36.0–46.0)
Hemoglobin: 14.1 g/dL (ref 12.0–15.0)
MCHC: 34.8 g/dL (ref 30.0–36.0)
MCV: 93.8 fL (ref 78.0–100.0)
Monocytes Absolute: 0.3 10*3/uL (ref 0.1–1.0)
Monocytes Relative: 5 % (ref 3–12)
Neutro Abs: 4 10*3/uL (ref 1.7–7.7)

## 2013-07-23 LAB — COMPREHENSIVE METABOLIC PANEL
ALT: 10 U/L (ref 0–35)
AST: 15 U/L (ref 0–37)
Chloride: 106 mEq/L (ref 96–112)
Creat: 0.84 mg/dL (ref 0.50–1.10)
Total Bilirubin: 0.6 mg/dL (ref 0.3–1.2)

## 2013-07-23 LAB — LIPID PANEL
HDL: 66 mg/dL (ref >39)
LDL Cholesterol: 53 mg/dL (ref 0–99)
Triglycerides: 101 mg/dL (ref <150)
VLDL: 20 mg/dL (ref 0–40)

## 2013-07-23 LAB — POCT URINALYSIS DIPSTICK
Blood, UA: NEGATIVE
Ketones, UA: NEGATIVE
Protein, UA: NEGATIVE
Spec Grav, UA: <=1.005
pH, UA: 7

## 2013-07-23 LAB — TSH: TSH: 0.703 u[IU]/mL (ref 0.350–4.500)

## 2013-07-23 NOTE — Progress Notes (Signed)
Chief Complaint  Patient presents with  . Annual Exam    fasting annual exam no pap, sees Dr.Horvath and is UTD. No concerns, well visit only.    Barbara Oconnor is a 47 y.o. female who presents for a complete physical.  She has the following concerns:  She is currently trying the Nuva ring to help regulate menses, just started 2 weeks ago by her GYN.  She notes some weight gain, constipation, no other complaints or concerns. Her back pain persists, but much improved from last year.  She is less active, sitting at computer more.  Immunization History  Administered Date(s) Administered  . Influenza Split 08/01/2010, 06/15/2011, 07/01/2013  . Tdap 04/06/2011   Last Pap smear: 05/2013 Last mammogram: 05/2013 Last colonoscopy: 2011 Last DEXA: never Dentist: twice year Ophtho: every other year, last about a year ago Exercise:  Teaches pilates once weekly, mows yard  Past Medical History  Diagnosis Date  . Low back pain 10/2010  . Goiter 2002    u/s 2002, 2010 (Dr. Sharl Ma)  . Multiple thyroid nodules     Past Surgical History  Procedure Laterality Date  . Tonsillectomy  1986  . Tear duct probing  2006    left  . Hemorrhoid banding    . Colonoscopy  03/2010    Kathryne Sharper); benign polyp, told to repeat 10 years    History   Social History  . Marital Status: Married    Spouse Name: N/A    Number of Children: 2  . Years of Education: N/A   Occupational History  . substitute Runner, broadcasting/film/video; also teaches pilates [employer]   Social History Main Topics  . Smoking status: Never Smoker   . Smokeless tobacco: Never Used  . Alcohol Use: Yes     Comment: 1 glass of red wine per evening (4-6 oz)  . Drug Use: No  . Sexual Activity: Yes    Partners: Male    Birth Control/ Protection: Surgical     Comment: husband had vasectomy   Other Topics Concern  . Not on file   Social History Narrative   Lives with husband and 1 son, 1 daughter. No pets    Family History   Problem Relation Age of Onset  . Cancer Mother 79    breast cancer  . Depression Mother   . Hyperlipidemia Father   . Hypertension Father   . Bipolar disorder Sister   . Cancer Maternal Aunt     thyroid cancer  . Cancer Paternal Uncle     lymphatic  . Diabetes Paternal Grandmother   . Heart disease Paternal Grandfather     Current outpatient prescriptions:etonogestrel-ethinyl estradiol (NUVARING) 0.12-0.015 MG/24HR vaginal ring, Place 1 each vaginally every 28 (twenty-eight) days. Insert vaginally and leave in place for 3 consecutive weeks, then remove for 1 week., Disp: , Rfl: ;  Sennosides (SENNA LAX PO), Take 1 tablet by mouth once a week., Disp: , Rfl: ;  Krill Oil 300 MG CAPS, Take 1 capsule by mouth daily., Disp: , Rfl:  loratadine (CLARITIN) 10 MG tablet, Take 10 mg by mouth daily., Disp: , Rfl:   No Known Allergies  ROS: The patient denies anorexia, fever, headaches, vision changes, decreased hearing, ear pain, sore throat, breast concerns, chest pain, palpitations, dizziness, syncope, dyspnea on exertion, cough, swelling, nausea, vomiting, diarrhea, abdominal pain, melena, indigestion/heartburn, hematuria, incontinence, dysuria, vaginal discharge, odor or itch, genital lesions, joint pains, numbness, tingling, weakness, tremor, suspicious skin lesions, depression, anxiety, abnormal bleeding/bruising,  or enlarged lymph nodes.  Cycles are irregular, just started on Nuva Ring. Mild seasonal allergies, uses generic claritin prn. +constipation and occasional hemorrhoidal bleeding +ongoing low back pain, chronic.  Takes Aleve as needed if more severe, requiring medication during her cycles (increased pain), otherwise just every couple of weeks.  PHYSICAL EXAM: BP 104/68  Pulse 68  Ht 5\' 8"  (1.727 m)  Wt 165 lb (74.844 kg)  BMI 25.09 kg/m2  LMP 07/03/2013 104/68 on repeat by MD, RA (initial BP 130/84)  General Appearance:  Alert, cooperative, no distress, appears stated age.   Head:  Normocephalic, without obvious abnormality, atraumatic   Eyes:  PERRL, conjunctiva/corneas clear, EOM's intact, fundi  benign   Ears:  Normal TM's and external ear canals   Nose:  Nares normal, mucosa normal, no drainage or sinus tenderness   Throat:  Lips, mucosa, and tongue normal; teeth and gums normal   Neck:  Supple, no lymphadenopathy; thyroid: Small thyroid nodule R lower lobe, nontender. no carotid  bruit or JVD   Back:  Spine nontender, no curvature, ROM normal, no CVA tenderness.   Lungs:  Clear to auscultation bilaterally without wheezes, rales or ronchi; respirations unlabored   Chest Wall:  No tenderness or deformity   Heart:  Regular rate and rhythm, S1 and S2 normal, no murmur, rub  or gallop   Breast Exam:  Deferred to GYN  Abdomen:  Soft, non-tender, nondistended, normoactive bowel sounds,  no masses, no hepatosplenomegaly   Genitalia:  Deferred to GYN  Rectal:  Deferred   Extremities:  No clubbing, cyanosis or edema   Pulses:  2+ and symmetric all extremities   Skin:  Skin color, texture, turgor normal, no rashes or lesions   Lymph nodes:  Cervical, supraclavicular, and axillary nodes normal   Neurologic:  CNII-XII intact, normal strength, sensation and gait; reflexes 2+ and symmetric throughout          Psych: Normal mood, affect, hygiene and grooming.   ASSESSMENT/PLAN:  Routine general medical examination at a health care facility - Plan: POCT Urinalysis Dipstick, TSH, CBC with Differential, Lipid panel, Comprehensive metabolic panel, Vit D  25 hydroxy (rtn osteoporosis monitoring)  Constipation--high fiber diet, increase water intake.  Consider stool softener daily (has wide caliber, firm stool).  Increasing exercise will also help.  Discussed monthly self breast exams and yearly mammograms; at least 30 minutes of aerobic activity at least 5 days/week; proper sunscreen use reviewed; healthy diet, including goals of calcium and vitamin D intake and alcohol  recommendations (less than or equal to 1 drink/day) reviewed; regular seatbelt use; changing batteries in smoke detectors.  Immunization recommendations discussed--she sent in records with her last TdaP showing she was UTD, already received flu shot.  Colonoscopy recommendations reviewed, UTD

## 2013-07-23 NOTE — Patient Instructions (Signed)
HEALTH MAINTENANCE RECOMMENDATIONS:  It is recommended that you get at least 30 minutes of aerobic exercise at least 5 days/week (for weight loss, you may need as much as 60-90 minutes). This can be any activity that gets your heart rate up. This can be divided in 10-15 minute intervals if needed, but try and build up your endurance at least once a week.  Weight bearing exercise is also recommended twice weekly.  Eat a healthy diet with lots of vegetables, fruits and fiber.  "Colorful" foods have a lot of vitamins (ie green vegetables, tomatoes, red peppers, etc).  Limit sweet tea, regular sodas and alcoholic beverages, all of which has a lot of calories and sugar.  Up to 1 alcoholic drink daily may be beneficial for women (unless trying to lose weight, watch sugars).  Drink a lot of water.  Calcium recommendations are 1200-1500 mg daily (1500 mg for postmenopausal women or women without ovaries), and vitamin D 1000 IU daily.  This should be obtained from diet and/or supplements (vitamins), and calcium should not be taken all at once, but in divided doses.  Monthly self breast exams and yearly mammograms for women over the age of 78 is recommended.  Sunscreen of at least SPF 30 should be used on all sun-exposed parts of the skin when outside between the hours of 10 am and 4 pm (not just when at beach or pool, but even with exercise, golf, tennis, and yard work!)  Use a sunscreen that says "broad spectrum" so it covers both UVA and UVB rays, and make sure to reapply every 1-2 hours.  Remember to change the batteries in your smoke detectors when changing your clock times in the spring and fall.  Use your seat belt every time you are in a car, and please drive safely and not be distracted with cell phones and texting while driving.  We need to check your records for the date of your last tetanus shot.

## 2013-07-24 ENCOUNTER — Encounter: Payer: Self-pay | Admitting: Family Medicine

## 2014-02-24 ENCOUNTER — Ambulatory Visit: Payer: Managed Care, Other (non HMO) | Admitting: Family Medicine

## 2014-02-24 ENCOUNTER — Ambulatory Visit (INDEPENDENT_AMBULATORY_CARE_PROVIDER_SITE_OTHER): Payer: BC Managed Care – PPO | Admitting: Family Medicine

## 2014-02-24 ENCOUNTER — Encounter: Payer: Self-pay | Admitting: Family Medicine

## 2014-02-24 VITALS — BP 120/80 | HR 60 | Ht 68.0 in | Wt 175.0 lb

## 2014-02-24 DIAGNOSIS — F4323 Adjustment disorder with mixed anxiety and depressed mood: Secondary | ICD-10-CM

## 2014-02-24 DIAGNOSIS — G4700 Insomnia, unspecified: Secondary | ICD-10-CM

## 2014-02-24 MED ORDER — ZOLPIDEM TARTRATE 10 MG PO TABS: 5.0000 mg | ORAL_TABLET | Freq: Every evening | ORAL | Status: DC | PRN

## 2014-02-24 NOTE — Progress Notes (Signed)
Chief Complaint  Patient presents with  . Advice Only    this past winter was having hot flashes and they were waking her up at night. Hot flashes are getting better but she is still waking up 2-3 times per night. Thinks this is having an effect on her emotional well being. Cries a lot, some weeks daily. x the last several months.    She has been having trouble sleeping all winter and spring (for about 6 months). She falls asleep okay, but has trouble staying asleep.  Sometimes low back pain can interfere with sleep.  She usually goes to the bathroom twice a night, but that is only when she is already awake, not what is waking her up. Once she is awake, she has trouble getting back to sleep, sometimes her mind gets to thinking about her daughter.  Her daughter has been sick (virus--Feb, needed tonsils out--March, then diagnosed with chronic migraines, still working on getting treated).  Sometimes when she wakes up in the middle of the night, she starts thinking about her, and can't back to sleep.  Once she is awake, she tries to take deep breaths, not think about her daughter, sometimes turn on TV for white noise/distraction.  Lately she can at least stay in bed and rest, sometimes doze, but other times she is just wide awake.  She tried not drinking the wine for a week at a time, and it didn't make a difference.  The wine helps with her back pain.  She found that ibuprofen made things worse for her back. Today she took a naproxen for pain, and it took the edge off.  She has tried exercising more.  She has been taking melatonin for a month, maybe slight help.  Hasn't tried any other OTC meds--doesn't like how they make her feel, groggy in the morning (tried Z-quil in the distant past, and this made her groggy).  She is crying frequently, not remembering things as well.  She feels sleep-deprived, not necessarily depressed, but feels sad, emotional.  Still enjoying things.  Appetite is normal (some weight  gain related to being home and eating with her daughter)  She has been napping some this week after dinner  LMP 5/14, after a 10 week stretch between cycles, often goes 6 weeks.  Had some hot flashes, but these have gotten better.  They were interfering with sleep, but not any longer.   Past Medical History  Diagnosis Date  . Low back pain 10/2010  . Goiter 2002    u/s 2002, 2010 (Dr. Buddy Duty)  . Multiple thyroid nodules    Past Surgical History  Procedure Laterality Date  . Tonsillectomy  1986  . Tear duct probing  2006    left  . Hemorrhoid banding    . Colonoscopy  03/2010    [facility]); benign polyp, told to repeat 10 years   History   Social History  . Marital Status: Married    Spouse Name: N/A    Number of Children: 2  . Years of Education: N/A   Occupational History  . substitute Pharmacist, hospital; also teaches [place] History Main Topics  . Smoking status: Never Smoker   . Smokeless tobacco: Never Used  . Alcohol Use: Yes     Comment: 1 glass of red wine per evening (4-6 oz)  . Drug Use: No  . Sexual Activity: Yes    Partners: Male    Birth Control/ Protection: Surgical  Comment: husband had vasectomy   Other Topics Concern  . Not on file   Social History Narrative   Lives with husband and 1 son, 1 daughter. No pets   Current Outpatient Prescriptions on File Prior to Visit  Medication Sig Dispense Refill  . Krill Oil 300 MG CAPS Take 1 capsule by mouth daily.       No current facility-administered medications on file prior to visit.   No Known Allergies  ROS:  Denies fevers, chills, URI symptoms, headaches, dizziness, chest pain, shortness of breath, nausea, bowel changes, bleeding/bruising/rash.  +crying, emotional, not hopeless/helpless.  +tired/fatigued.  Occasional mild hot flash.  Irregular menses (see HPI)  PHYSICAL EXAM: BP 120/80  Pulse 60  Ht 5\' 8"  (1.727 m)  Wt 175 lb (79.379 kg)  BMI 26.61 kg/m2  LMP  02/04/2014 Well developed, pleasant female, appearing somewhat tired, and getting tearful in discussion today Psych: crying intermittently, but full range of affect. Normal speech, hygiene, grooming, eye contact  ASSESSMENT/PLAN:  Insomnia  Adjustment disorder with mixed anxiety and depressed mood - related to daughter's illnesses.  exacerbated by lack of sleep   Discussed Ambien, ambien CR, Lunesta including risks, side effects, FDA indications (short-term vs long-term). Opted to start with ambien 5-10 mg qHS prn.  Discussed using regularly for the next week (or two) to try and catch up on sleep and see if her emotions improve.  Briefly discussed trazadone, amitriptylene (daughter takes for migraines, has 25mg ), and side effects--she might feel groggy from these, especially the amitriptylene.  Also briefly discussed sonata (prn use in middle of night, but 4am would be too late, and this is often when she is awake)  If ongoing anxiety/stress contributing, then consider more longterm medication, such as Paxil.  Briefly reviewed side effects of Paxil.  Call within 2 weeks to let us know if sleep is better, and if so, are emotions better.  If sleeping well, but still crying and emotional, consider starting Paxil  Sleep hygiene reviewed in detail, and handout given.  Avoid  Napping.  Try tylenol arthritis vs Aleve at bedtime to help with back pain, and to prevent interference with sleep  25 min visit, more than 1/2 spent counseling

## 2014-02-24 NOTE — Patient Instructions (Signed)

## 2014-03-30 ENCOUNTER — Telehealth: Payer: Self-pay | Admitting: Internal Medicine

## 2014-03-30 NOTE — Telephone Encounter (Signed)
Pt states she wants to try the time release ambien. Call into [facility] [address]

## 2014-03-30 NOTE — Telephone Encounter (Signed)
Left message for pt to give me a call back.

## 2014-03-30 NOTE — Telephone Encounter (Signed)
Please verify with patient whether she has been taking full tablet or cutting in 1/2.  If she has been taking the full tablet, then please call in ambien CR 12.5, 1 po qHS prn insomnia #30 with 0 refills.  If for some reason she is only taking 1/2 (ie if full was too strong), then the dose would be the 6.25mg  of the ambien CR, with directions to take 1-2, since the CR tablet shouldn't be cut.

## 2014-04-01 NOTE — Telephone Encounter (Signed)
Left a message for pt to give me a call back

## 2014-04-02 MED ORDER — ZOLPIDEM TARTRATE ER 12.5 MG PO TBCR: 12.5000 mg | EXTENDED_RELEASE_TABLET | Freq: Every evening | ORAL | Status: DC | PRN

## 2014-04-02 NOTE — Telephone Encounter (Signed)
Called in med for pt. Pt has been taking a full tablet.

## 2014-07-26 ENCOUNTER — Encounter: Payer: Self-pay | Admitting: Family Medicine

## 2014-09-07 ENCOUNTER — Telehealth: Payer: Self-pay | Admitting: Family Medicine

## 2014-09-07 NOTE — Telephone Encounter (Signed)
Informed message about Dr. Johnsie Kindred response . She will check on getting records from Dr. Marcello Moores

## 2014-09-07 NOTE — Telephone Encounter (Signed)
Patient scheduled CPE for 11/24/14 in afternoon, can she come in prior to have labs done?  Also, pt  Now has IT consultant and needs referral to Dr. Ned Card, [facility], she has been seeing her for rectal bleeding and they are in the process of scheduling surgery for January

## 2014-09-07 NOTE — Telephone Encounter (Signed)
Chart was reviewed.  Based on her age and lack of medical problems, as well as her very normal labs last year, she may not need any labs done this year (possibly thyroid, if ongoing issues with goiter, if nobody else is checking--this is a nonfasting test that can be done at visit). I don't know if she is required to have labs done for insurance purposes (to save money), if so, I would need to see paperwork to know what to order.  Her lipids were excellent last year, normal thyroid, vitamin D, blood count and chem panel.  Also, I have no records from a Dr. Marcello Moores.  [facility] should be in Epic (is Dr. Marcello Moores with [facility]?), but there is nothing in chart about rectal bleeding.  I would need records/info for referral.  (CBC might be something she needs, if having ongoing issues, but we can decide that at her visit, also not fasting test)

## 2014-10-29 ENCOUNTER — Encounter (HOSPITAL_BASED_OUTPATIENT_CLINIC_OR_DEPARTMENT_OTHER): Payer: Self-pay | Admitting: *Deleted

## 2014-10-29 NOTE — Progress Notes (Signed)
Pt instructed npo p mn 2/10.  To [facility] 2/11 @ 0600.  Needs hgb on arrival

## 2014-11-04 ENCOUNTER — Encounter (HOSPITAL_BASED_OUTPATIENT_CLINIC_OR_DEPARTMENT_OTHER)
Admission: RE | Disposition: A | Discharge status: Home / Self Care | Payer: Self-pay | Source: Ambulatory Visit | Attending: General Surgery

## 2014-11-04 ENCOUNTER — Encounter (HOSPITAL_BASED_OUTPATIENT_CLINIC_OR_DEPARTMENT_OTHER): Payer: Self-pay | Admitting: *Deleted

## 2014-11-04 ENCOUNTER — Ambulatory Visit (HOSPITAL_BASED_OUTPATIENT_CLINIC_OR_DEPARTMENT_OTHER): Payer: Managed Care, Other (non HMO) | Admitting: Anesthesiology

## 2014-11-04 ENCOUNTER — Ambulatory Visit (HOSPITAL_BASED_OUTPATIENT_CLINIC_OR_DEPARTMENT_OTHER)
Admission: RE | Admit: 2014-11-04 | Discharge: 2014-11-04 | Disposition: A | Discharge status: Home / Self Care | Payer: Managed Care, Other (non HMO) | Source: Ambulatory Visit | Attending: General Surgery | Admitting: General Surgery

## 2014-11-04 DIAGNOSIS — K601 Chronic anal fissure: Secondary | ICD-10-CM | POA: Diagnosis not present

## 2014-11-04 DIAGNOSIS — K6289 Other specified diseases of anus and rectum: Secondary | ICD-10-CM | POA: Diagnosis present

## 2014-11-04 DIAGNOSIS — Z79899 Other long term (current) drug therapy: Secondary | ICD-10-CM | POA: Diagnosis not present

## 2014-11-04 DIAGNOSIS — Z79891 Long term (current) use of opiate analgesic: Secondary | ICD-10-CM | POA: Insufficient documentation

## 2014-11-04 HISTORY — PX: EXAMINATION UNDER ANESTHESIA: SHX1540

## 2014-11-04 HISTORY — DX: Other complications of anesthesia, initial encounter: T88.59XA

## 2014-11-04 HISTORY — DX: Hemorrhage of anus and rectum: K62.5

## 2014-11-04 HISTORY — DX: Personal history of other specified conditions: Z87.898

## 2014-11-04 HISTORY — DX: Adverse effect of unspecified anesthetic, initial encounter: T41.45XA

## 2014-11-04 HISTORY — PX: SPHINCTEROTOMY: SHX5279

## 2014-11-04 LAB — POCT HEMOGLOBIN-HEMACUE: Hemoglobin: 8.6 g/dL — ABNORMAL LOW (ref 12.0–15.0)

## 2014-11-04 SURGERY — SPHINCTEROTOMY, ANAL
Anesthesia: Monitor Anesthesia Care | Site: Rectum

## 2014-11-04 MED ORDER — SODIUM CHLORIDE 0.9 % IR SOLN
Status: DC | PRN
  Administered 2014-11-04: 500 mL

## 2014-11-04 MED ORDER — FENTANYL CITRATE 0.05 MG/ML IJ SOLN
INTRAMUSCULAR | Status: AC
  Filled 2014-11-04: qty 2

## 2014-11-04 MED ORDER — ACETAMINOPHEN 650 MG RE SUPP
650.0000 mg | RECTAL | Status: DC | PRN
  Filled 2014-11-04: qty 1

## 2014-11-04 MED ORDER — LACTATED RINGERS IV SOLN
INTRAVENOUS | Status: DC
  Administered 2014-11-04: 07:00:00 via INTRAVENOUS
  Filled 2014-11-04: qty 1000

## 2014-11-04 MED ORDER — SODIUM CHLORIDE 0.9 % IJ SOLN
INTRAMUSCULAR | Status: DC | PRN
  Administered 2014-11-04: 50 mL via INTRAVENOUS

## 2014-11-04 MED ORDER — KETOROLAC TROMETHAMINE 30 MG/ML IJ SOLN
30.0000 mg | Freq: Once | INTRAMUSCULAR | Status: DC | PRN
  Filled 2014-11-04: qty 1

## 2014-11-04 MED ORDER — OXYCODONE HCL 5 MG PO TABS
5.0000 mg | ORAL_TABLET | ORAL | Status: DC | PRN
Start: 2014-11-04 — End: 2014-11-04
  Filled 2014-11-04: qty 2

## 2014-11-04 MED ORDER — SODIUM CHLORIDE 0.9 % IJ SOLN
3.0000 mL | INTRAMUSCULAR | Status: DC | PRN
  Filled 2014-11-04: qty 3

## 2014-11-04 MED ORDER — PROPOFOL 10 MG/ML IV BOLUS
INTRAVENOUS | Status: DC | PRN
  Administered 2014-11-04: 40 mg via INTRAVENOUS

## 2014-11-04 MED ORDER — ONDANSETRON HCL 4 MG/2ML IJ SOLN
INTRAMUSCULAR | Status: DC | PRN
  Administered 2014-11-04: 4 mg via INTRAVENOUS

## 2014-11-04 MED ORDER — MIDAZOLAM HCL 2 MG/2ML IJ SOLN
INTRAMUSCULAR | Status: AC
  Filled 2014-11-04: qty 2

## 2014-11-04 MED ORDER — LIDOCAINE HCL (CARDIAC) 20 MG/ML IV SOLN
INTRAVENOUS | Status: DC | PRN
  Administered 2014-11-04: 50 mg via INTRAVENOUS

## 2014-11-04 MED ORDER — SODIUM CHLORIDE 0.9 % IJ SOLN
3.0000 mL | Freq: Two times a day (BID) | INTRAMUSCULAR | Status: DC
  Filled 2014-11-04: qty 3

## 2014-11-04 MED ORDER — PROMETHAZINE HCL 25 MG/ML IJ SOLN
6.2500 mg | INTRAMUSCULAR | Status: DC | PRN
  Filled 2014-11-04: qty 1

## 2014-11-04 MED ORDER — ACETAMINOPHEN 325 MG PO TABS
650.0000 mg | ORAL_TABLET | ORAL | Status: DC | PRN
  Filled 2014-11-04: qty 2

## 2014-11-04 MED ORDER — FENTANYL CITRATE 0.05 MG/ML IJ SOLN
25.0000 ug | INTRAMUSCULAR | Status: DC | PRN
  Filled 2014-11-04: qty 1

## 2014-11-04 MED ORDER — SODIUM CHLORIDE 0.9 % IV SOLN
250.0000 mL | INTRAVENOUS | Status: DC | PRN
  Filled 2014-11-04: qty 250

## 2014-11-04 MED ORDER — BUPIVACAINE-EPINEPHRINE 0.5% -1:200000 IJ SOLN
INTRAMUSCULAR | Status: DC | PRN
  Administered 2014-11-04: 20 mL

## 2014-11-04 MED ORDER — OXYCODONE HCL 5 MG PO TABS: 5.0000 mg | ORAL_TABLET | ORAL | Status: DC | PRN

## 2014-11-04 MED ORDER — ONABOTULINUMTOXINA 100 UNITS IJ SOLR
INTRAMUSCULAR | Status: DC | PRN
  Administered 2014-11-04: 100 [IU] via INTRAMUSCULAR

## 2014-11-04 MED ORDER — PROPOFOL 10 MG/ML IV EMUL
INTRAVENOUS | Status: DC | PRN
  Administered 2014-11-04: 200 ug/kg/min via INTRAVENOUS

## 2014-11-04 SURGICAL SUPPLY — 59 items
BENZOIN TINCTURE PRP APPL 2/3 (GAUZE/BANDAGES/DRESSINGS) ×2 IMPLANT
BLADE HEX COATED 2.75 (ELECTRODE) IMPLANT
BLADE SURG 10 STRL SS (BLADE) IMPLANT
BLADE SURG 15 STRL LF DISP TIS (BLADE) ×1 IMPLANT
BLADE SURG 15 STRL SS (BLADE) ×1
BRIEF STRETCH FOR OB PAD LRG (UNDERPADS AND DIAPERS) ×2 IMPLANT
CANISTER SUCTION 2500CC (MISCELLANEOUS) IMPLANT
CLOTH BEACON ORANGE TIMEOUT ST (SAFETY) ×2 IMPLANT
COVER MAYO STAND STRL (DRAPES) ×2 IMPLANT
COVER TABLE BACK 60X90 (DRAPES) ×2 IMPLANT
DECANTER SPIKE VIAL GLASS SM (MISCELLANEOUS) ×2 IMPLANT
DRAIN PENROSE 18X1/2 LTX STRL (DRAIN) IMPLANT
DRAPE LG THREE QUARTER DISP (DRAPES) IMPLANT
DRAPE PED LAPAROTOMY (DRAPES) ×2 IMPLANT
DRAPE UNDERBUTTOCKS STRL (DRAPE) IMPLANT
DRAPE UTILITY XL STRL (DRAPES) ×2 IMPLANT
DRSG PAD ABDOMINAL 8X10 ST (GAUZE/BANDAGES/DRESSINGS) ×2 IMPLANT
ELECT BLADE 6.5 .24CM SHAFT (ELECTRODE) IMPLANT
ELECT REM PT RETURN 9FT ADLT (ELECTROSURGICAL) ×2
ELECTRODE REM PT RTRN 9FT ADLT (ELECTROSURGICAL) ×1 IMPLANT
GAUZE SPONGE 4X4 16PLY XRAY LF (GAUZE/BANDAGES/DRESSINGS) ×2 IMPLANT
GLOVE BIO SURGEON STRL SZ 6.5 (GLOVE) ×2 IMPLANT
GLOVE BIOGEL PI IND STRL 7.0 (GLOVE) ×1 IMPLANT
GLOVE BIOGEL PI IND STRL 7.5 (GLOVE) ×2 IMPLANT
GLOVE BIOGEL PI INDICATOR 7.0 (GLOVE) ×1
GLOVE BIOGEL PI INDICATOR 7.5 (GLOVE) ×2
GLOVE INDICATOR 7.0 STRL GRN (GLOVE) ×2 IMPLANT
GLOVE SURG SS PI 7.5 STRL IVOR (GLOVE) ×2 IMPLANT
GOWN L4 XXLG W/PAP TWL (GOWN DISPOSABLE) ×2 IMPLANT
GOWN STRL REIN XL XLG (GOWN DISPOSABLE) ×4 IMPLANT
GOWN STRL REUS W/TWL 2XL LVL3 (GOWN DISPOSABLE) ×2 IMPLANT
GOWN STRL REUS W/TWL XL LVL3 (GOWN DISPOSABLE) ×2 IMPLANT
HEMOSTAT SURGICEL 2X14 (HEMOSTASIS) IMPLANT
LEGGING LITHOTOMY PAIR STRL (DRAPES) IMPLANT
LOOP VESSEL MAXI BLUE (MISCELLANEOUS) IMPLANT
NDL SAFETY ECLIPSE 18X1.5 (NEEDLE) ×1 IMPLANT
NEEDLE HYPO 18GX1.5 SHARP (NEEDLE) ×1
NEEDLE HYPO 22GX1.5 SAFETY (NEEDLE) ×2 IMPLANT
NEEDLE HYPO 25X1 1.5 SAFETY (NEEDLE) ×2 IMPLANT
NS IRRIG 500ML POUR BTL (IV SOLUTION) ×2 IMPLANT
PACK BASIN DAY SURGERY FS (CUSTOM PROCEDURE TRAY) ×2 IMPLANT
PAD ABD 8X10 STRL (GAUZE/BANDAGES/DRESSINGS) IMPLANT
PENCIL BUTTON HOLSTER BLD 10FT (ELECTRODE) IMPLANT
SPONGE GAUZE 4X4 12PLY (GAUZE/BANDAGES/DRESSINGS) IMPLANT
SPONGE GAUZE 4X4 12PLY STER LF (GAUZE/BANDAGES/DRESSINGS) ×2 IMPLANT
SPONGE SURGIFOAM ABS GEL 100 (HEMOSTASIS) IMPLANT
SPONGE SURGIFOAM ABS GEL 12-7 (HEMOSTASIS) IMPLANT
SUT CHROMIC 2 0 SH (SUTURE) IMPLANT
SUT CHROMIC 3 0 SH 27 (SUTURE) ×2 IMPLANT
SUT ETHIBOND 0 (SUTURE) IMPLANT
SUT VIC AB 2-0 SH 27 (SUTURE)
SUT VIC AB 2-0 SH 27XBRD (SUTURE) IMPLANT
SYR CONTROL 10ML LL (SYRINGE) ×2 IMPLANT
TOWEL OR 17X24 6PK STRL BLUE (TOWEL DISPOSABLE) ×4 IMPLANT
TRAY DSU PREP LF (CUSTOM PROCEDURE TRAY) ×2 IMPLANT
TUBE CONNECTING 12X1/4 (SUCTIONS) IMPLANT
UNDERPAD 30X30 INCONTINENT (UNDERPADS AND DIAPERS) ×2 IMPLANT
WATER STERILE IRR 500ML POUR (IV SOLUTION) ×2 IMPLANT
YANKAUER SUCT BULB TIP NO VENT (SUCTIONS) IMPLANT

## 2014-11-04 NOTE — Anesthesia Postprocedure Evaluation (Signed)
  Anesthesia Post-op Note  Patient: Barbara Oconnor  Procedure(s) Performed: Procedure(s) (LRB): CHEMICAL SPHINCTEROTOMY WITH BOTOX/ fisurectomy (N/A) EXAM UNDER ANESTHESIA (N/A)  Patient Location: PACU  Anesthesia Type: MAC  Level of Consciousness: awake and alert   Airway and Oxygen Therapy: Patient Spontanous Breathing  Post-op Pain: mild  Post-op Assessment: Post-op Vital signs reviewed, Patient's Cardiovascular Status Stable, Respiratory Function Stable, Patent Airway and No signs of Nausea or vomiting  Last Vitals:  Filed Vitals:   11/04/14 0820  BP: 126/61  Pulse:   Temp:   Resp:     Post-op Vital Signs: stable   Complications: No apparent anesthesia complications

## 2014-11-04 NOTE — Discharge Instructions (Addendum)
ANORECTAL SURGERY: POST OP INSTRUCTIONS °1. Take your usually prescribed home medications unless otherwise directed. °2. DIET: During the first few hours after surgery sip on some liquids until you are able to urinate.  It is normal to not urinate for several hours after this surgery.  If you feel uncomfortable, please contact the office for instructions.  After you are able to urinate,you may eat, if you feel like it.  Follow a light bland diet the first 24 hours after arrival home, such as soup, liquids, crackers, etc.  Be sure to include lots of fluids daily (6-8 glasses).  Avoid fast food or heavy meals, as your are more likely to get nauseated.  Eat a low fat diet the next few days after surgery.  Limit caffeine intake to 1-2 servings a day. °3. PAIN CONTROL: °a. Pain is best controlled by a usual combination of several different methods TOGETHER: °i. Muscle relaxation: Soak in a warm bath (or Sitz bath) three times a day and after bowel movements.  Continue to do this until all pain is resolved. °ii. Over the counter pain medication °iii. Prescription pain medication °b. Most patients will experience some swelling and discomfort in the anus/rectal area and incisions.  Heat such as warm towels, sitz baths, warm baths, etc to help relax tight/sore spots and speed recovery.  Some people prefer to use ice, especially in the first couple days after surgery, as it may decrease the pain and swelling, or alternate between ice & heat.  Experiment to what works for you.  Swelling and bruising can take several weeks to resolve.  Pain can take even longer to completely resolve. °c. It is helpful to take an over-the-counter pain medication regularly for the first few weeks.  Choose one of the following that works best for you: °i. Naproxen (Aleve, etc)  Two 220mg tabs twice a day °ii. Ibuprofen (Advil, etc) Three 200mg tabs four times a day (every meal & bedtime) °d. A  prescription for pain medication (such as percocet,  oxycodone, hydrocodone, etc) should be given to you upon discharge.  Take your pain medication as prescribed.  °i. If you are having problems/concerns with the prescription medicine (does not control pain, nausea, vomiting, rash, itching, etc), please call us [phone] to see if we need to switch you to a different pain medicine that will work better for you and/or control your side effect better. °ii. If you need a refill on your pain medication, please contact your pharmacy.  They will contact our office to request authorization. Prescriptions will not be filled after 5 pm or on week-ends. °4. KEEP YOUR BOWELS REGULAR and AVOID CONSTIPATION °a. The goal is one to two soft bowel movements a day.  You should at least have a bowel movement every other day. °b. Avoid getting constipated.  Between the surgery and the pain medications, it is common to experience some constipation. This can be very painful after rectal surgery.  Increasing fluid intake and taking a fiber supplement (such as Metamucil, Citrucel, FiberCon, etc) 1-2 times a day regularly will usually help prevent this problem from occurring.  A stool softener like colace is also recommended.  This can be purchased over the counter at your pharmacy.  You can take it up to 3 times a day.  If you do not have a bowel movement after 24 hrs since your surgery, take one does of milk of magnesia.  If you still haven't had a bowel movement 8-12 hours after   that dose, take another dose.  If you don't have a bowel movement 48 hrs after surgery, purchase a Fleets enema from the drug store and administer gently per package instructions.  If you still are having trouble with your bowel movements after that, please call the office for further instructions. °c. If you develop diarrhea or have many loose bowel movements, simplify your diet to bland foods & liquids for a few days.  Stop any stool softeners and decrease your fiber supplement.  Switching to mild  anti-diarrheal medications (Kayopectate, Pepto Bismol) can help.  If this worsens or does not improve, please call us. ° °5. Wound Care °a. Remove your bandages before your first bowel movement or 8 hours after surgery.     °b. Remove any wound packing material at this tim,e as well.  You do not need to repack the wound unless instructed otherwise.  Wear an absorbent pad or soft cotton gauze in your underwear to catch any drainage and help keep the area clean. You should change this every 2-3 hours while awake. °c. Keep the area clean and dry.  Bathe / shower every day, especially after bowel movements.  Keep the area clean by showering / bathing over the incision / wound.   It is okay to soak an open wound to help wash it.  Wet wipes or showers / gentle washing after bowel movements is often less traumatic than regular toilet paper. °d. You may have some styrofoam-like soft packing in the rectum which will come out with the first bowel movement.  °e. You will often notice bleeding with bowel movements.  This should slow down by the end of the first week of surgery °f. Expect some drainage.  This should slow down, too, by the end of the first week of surgery.  Wear an absorbent pad or soft cotton gauze in your underwear until the drainage stops. °g. Do Not sit on a rubber or pillow ring.  This can make you symptoms worse.  You may sit on a soft pillow if needed.  °6. ACTIVITIES as tolerated:   °a. You may resume regular (light) daily activities beginning the next day--such as daily self-care, walking, climbing stairs--gradually increasing activities as tolerated.  If you can walk 30 minutes without difficulty, it is safe to try more intense activity such as jogging, treadmill, bicycling, low-impact aerobics, swimming, etc. °b. Save the most intensive and strenuous activity for last such as sit-ups, heavy lifting, contact sports, etc  Refrain from any heavy lifting or straining until you are off narcotics for pain  control.   °c. You may drive when you are no longer taking prescription pain medication, you can comfortably sit for long periods of time, and you can safely maneuver your car and apply brakes. °d. You may have sexual intercourse when it is comfortable.  °7. FOLLOW UP in our office °a. Please call [facility] at [phone] to set up an appointment to see your surgeon in the office for a follow-up appointment approximately 3-4 weeks after your surgery. °b. Make sure that you call for this appointment the day you arrive home to insure a convenient appointment time. °10. IF YOU HAVE DISABILITY OR FAMILY LEAVE FORMS, BRING THEM TO THE OFFICE FOR PROCESSING.  DO NOT GIVE THEM TO YOUR DOCTOR. ° ° ° ° °WHEN TO CALL US [phone]: °1. Poor pain control °2. Reactions / problems with new medications (rash/itching, nausea, etc)  °3. Fever over 101.5 F (38.5 C) °4.   Inability to urinate °5. Nausea and/or vomiting °6. Worsening swelling or bruising °7. Continued bleeding from incision. °8. Increased pain, redness, or drainage from the incision ° °The clinic staff is available to answer your questions during regular business hours (8:30am-5pm).  Please don’t hesitate to call and ask to speak to one of our nurses for clinical concerns.   A surgeon from [facility] is always on call at the hospitals °  °If you have a medical emergency, go to the nearest emergency room or call 911. °  ° °[facility] °[address], [address], [place], [place]  [address] ? °MAIN: [phone] ? TOLL FREE: [phone] ? °FAX [phone] °[email or web address] ° ° ° ° ° °Post Anesthesia Home Care Instructions ° °Activity: °Get plenty of rest for the remainder of the day. A responsible adult should stay with you for 24 hours following the procedure.  °For the next 24 hours, DO NOT: °-Drive a car °-Operate machinery °-Drink alcoholic beverages °-Take any medication unless instructed by your  physician °-Make any legal decisions or sign important papers. ° °Meals: °Start with liquid foods such as gelatin or soup. Progress to regular foods as tolerated. Avoid greasy, spicy, heavy foods. If nausea and/or vomiting occur, drink only clear liquids until the nausea and/or vomiting subsides. Call your physician if vomiting continues. ° °Special Instructions/Symptoms: °Your throat may feel dry or sore from the anesthesia or the breathing tube placed in your throat during surgery. If this causes discomfort, gargle with warm salt water. The discomfort should disappear within 24 hours. ° °

## 2014-11-04 NOTE — Transfer of Care (Signed)
Immediate Anesthesia Transfer of Care Note  Patient: Barbara Oconnor  Procedure(s) Performed: Procedure(s): CHEMICAL SPHINCTEROTOMY WITH BOTOX/ fisurectomy (N/A) EXAM UNDER ANESTHESIA (N/A)  Patient Location: PACU  Anesthesia Type:MAC  Level of Consciousness: awake, alert  and patient cooperative  Airway & Oxygen Therapy: Patient Spontanous Breathing and Patient connected to nasal cannula oxygen  Post-op Assessment: Report given to RN and Post -op Vital signs reviewed and stable  Post vital signs: Reviewed and stable  Last Vitals:  Filed Vitals:   11/04/14 0626  BP: 113/71  Pulse: 67  Temp: 36.9 C  Resp: 14    Complications: No apparent anesthesia complications

## 2014-11-04 NOTE — Op Note (Signed)
11/04/2014  7:53 AM  PATIENT:  Barbara Oconnor  49 y.o. female  Patient Care Team: Rita Ohara, MD as PCP - General (Family Medicine)  PRE-OPERATIVE DIAGNOSIS:  anal fissure  POST-OPERATIVE DIAGNOSIS:  anal fissure  PROCEDURE: CHEMICAL SPHINCTEROTOMY WITH BOTOX/ fisurectomy EXAM UNDER ANESTHESIA  SURGEON:  Surgeon(s): Leighton Ruff, MD  ASSISTANT: none   ANESTHESIA:   local and MAC  SPECIMEN:  No Specimen  DISPOSITION OF SPECIMEN:  N/A  COUNTS:  YES  PLAN OF CARE: Discharge to home after PACU  PATIENT DISPOSITION:  PACU - hemodynamically stable.  INDICATION: 49 y.o. F with chronic anal fissure, who has failed medical therapy.   OR FINDINGS: L posterior, distal anal fissure  DESCRIPTION: the patient was identified in the preoperative holding area and taken to the OR where they were laid on the operating room table.  MAC anesthesia was induced without difficulty. The patient was then positioned in prone jackknife position with buttocks gently taped apart.  The patient was then prepped and draped in usual sterile fashion.  SCDs were noted to be in place prior to the initiation of anesthesia. A surgical timeout was performed indicating the correct patient, procedure, positioning and need for preoperative antibiotics.  A rectal block was performed with Marcaine with epinephrine.    I began with a digital rectal exam.  There were no masses palpated.  I then placed a Hill-Ferguson anoscope into the anal canal and evaluated this completely.  She had mildly elevated sphincter tone.  The fissure was located in the left posterior region and more distal than normal.  There was exposed muscles fibers.  There was an associated distal skin tag.  I decided to inject a total of 40 units of Botox into the intersphincteric groove.  I then excised the associated skin tag and sharply, as this appeared to be hindering healing. I excised the edges of the fissure as well.  I closed the excision with 3  interrupted, loose 3-0 Chromic sutures.  Hemostasis was good.  A dressing was applied.  The patient was awakened from anesthesia and sent to the PACU in stable condition.  All counts were correct per OR staff.

## 2014-11-04 NOTE — Anesthesia Preprocedure Evaluation (Addendum)
Anesthesia Evaluation  Patient identified by MRN, date of birth, ID band Patient awake    Airway Mallampati: I  TM Distance: >3 FB Neck ROM: Full    Dental  (+) Teeth Intact, Dental Advisory Given   Pulmonary neg pulmonary ROS,    Pulmonary exam normal       Cardiovascular Exercise Tolerance: Good negative cardio ROS  Rhythm:Regular Rate:Normal     Neuro/Psych negative neurological ROS  negative psych ROS   GI/Hepatic negative GI ROS, Neg liver ROS,   Endo/Other  negative endocrine ROS  Renal/GU negative Renal ROS  negative genitourinary   Musculoskeletal negative musculoskeletal ROS (+)   Abdominal   Peds  Hematology negative hematology ROS (+)   Anesthesia Other Findings   Reproductive/Obstetrics negative OB ROS                            Anesthesia Physical Anesthesia Plan  ASA: II  Anesthesia Plan: MAC   Post-op Pain Management:    Induction: Intravenous  Airway Management Planned: Simple Face Mask  Additional Equipment:   Intra-op Plan:   Post-operative Plan:   Informed Consent: I have reviewed the patients History and Physical, chart, labs and discussed the procedure including the risks, benefits and alternatives for the proposed anesthesia with the patient or authorized representative who has indicated his/her understanding and acceptance.   Dental advisory given  Plan Discussed with: CRNA and Surgeon  Anesthesia Plan Comments:         Anesthesia Quick Evaluation

## 2014-11-04 NOTE — H&P (Signed)
Barbara Oconnor is an 49 y.o. female.   Chief Complaint: anal pain HPI: Patient with anal fissure.  She has tried a course of topical diltiazem.  This did not resolve her symptoms.  She is here today for treatment.    Past Medical History  Diagnosis Date  . Low back pain 10/2010  . Goiter 2002    u/s 2002, 2010 (Dr. Buddy Duty)  . Multiple thyroid nodules   . Complication of anesthesia     slow awakening  . H/O motion sickness   . Rectal bleeding     Past Surgical History  Procedure Laterality Date  . Tonsillectomy  1986  . Tear duct probing  2006    left  . Hemorrhoid banding    . Colonoscopy  03/2010    [facility] Ser); benign polyp, told to repeat 10 years    Family History  Problem Relation Age of Onset  . Cancer Mother 94    breast cancer  . Depression Mother   . Hyperlipidemia Father   . Hypertension Father   . Bipolar disorder Sister   . Cancer Maternal Aunt     thyroid cancer  . Cancer Paternal Uncle     lymphatic  . Diabetes Paternal Grandmother   . Heart disease Paternal Grandfather    Social History:  reports that she has never smoked. She has never used smokeless tobacco. She reports that she drinks alcohol. She reports that she does not use illicit drugs.  Allergies: No Known Allergies  Medications Prior to Admission  Medication Sig Dispense Refill  . estrogens conjugated, synthetic B, (ENJUVIA) 0.3 MG tablet Take 0.3 mg by mouth daily.    Marland Kitchen gabapentin (NEURONTIN) 100 MG capsule Take 100 mg by mouth daily.    . polyethylene glycol (MIRALAX / GLYCOLAX) packet Take 17 g by mouth daily.    . traMADol (ULTRAM) 50 MG tablet Take 50 mg by mouth 2 (two) times daily.    Marland Kitchen zolpidem (AMBIEN CR) 12.5 MG CR tablet Take 1 tablet (12.5 mg total) by mouth at bedtime as needed for sleep. 30 tablet 0  . cetirizine (ZYRTEC) 10 MG tablet Take 10 mg by mouth daily.    Javier Docker Oil 300 MG CAPS Take 1 capsule by mouth daily.    . Melatonin 5 MG CAPS Take 1 capsule by mouth at  bedtime.      No results found for this or any previous visit (from the past 48 hour(s)). No results found.  Review of Systems  Constitutional: Negative for fever and chills.  Respiratory: Negative for cough and shortness of breath.   Cardiovascular: Negative for chest pain.  Gastrointestinal: Negative for nausea, vomiting and abdominal pain.  Genitourinary: Negative for dysuria, urgency and frequency.  Neurological: Negative for dizziness and headaches.    Blood pressure 113/71, pulse 67, temperature 98.4 F (36.9 C), temperature source Oral, resp. rate 14, height 5\' 8"  (1.727 m), weight 172 lb (78.019 kg), SpO2 96 %. Physical Exam  Constitutional: She is oriented to person, place, and time. She appears well-developed and well-nourished.  HENT:  Head: Normocephalic and atraumatic.  Eyes: Conjunctivae are normal. Pupils are equal, round, and reactive to light.  Neck: Normal range of motion. Neck supple.  Cardiovascular: Normal rate and regular rhythm.   Respiratory: Effort normal and breath sounds normal.  GI: Soft. Bowel sounds are normal. She exhibits no distension. There is no tenderness.  Musculoskeletal: Normal range of motion.  Neurological: She is alert and oriented to  person, place, and time.  Skin: Skin is warm and dry.     Assessment/Plan 49 y.o. F with anal fissure.  Will perform EUA and perform chemical sphincterotomy if necessary.  Risks include bleeding and pain, a small risk of temporary incontinence and recurrence of fissure.  I believe she understands these risks and has agreed to proceed with surgery.    Cortlyn Cannell C. [id number]/8937, 3:42 AM

## 2014-11-05 ENCOUNTER — Encounter (HOSPITAL_BASED_OUTPATIENT_CLINIC_OR_DEPARTMENT_OTHER): Payer: Self-pay | Admitting: General Surgery

## 2014-11-09 ENCOUNTER — Ambulatory Visit (INDEPENDENT_AMBULATORY_CARE_PROVIDER_SITE_OTHER): Payer: Managed Care, Other (non HMO) | Admitting: Family Medicine

## 2014-11-09 ENCOUNTER — Encounter: Payer: Self-pay | Admitting: Family Medicine

## 2014-11-09 VITALS — BP 130/86 | HR 85

## 2014-11-09 DIAGNOSIS — R208 Other disturbances of skin sensation: Secondary | ICD-10-CM

## 2014-11-09 NOTE — Progress Notes (Signed)
   Subjective:    Patient ID: Barbara Oconnor, female    DOB: [birth year], 49 y.o.   MRN: [id number]  HPI She has a 4 day history of a numb feeling in the left forearm as well as left leg. They tend to occur together. Prior to this she did have surgery for a anal fissure. There is no associated weakness, numbness, tingling, chest pain, shortness of breath, neck pain. She continues on medications listed in the chart and is having no difficulty with this. She is already on pain medication as well as meds for neuropathic pain.  Review of Systems     Objective:   Physical Exam Alert and in no distress. Normal motor sensory and DTRs of her arms and legs. Negative straight leg raising. Full motion of her neck.       Assessment & Plan:  Dysesthesia  I explained that I did not find anything pathologic to cause her symptoms. Recommend continuing on her present medication regimen and adding Tylenol for more pain relief. If her symptoms change, she will let me know.

## 2014-11-12 ENCOUNTER — Telehealth: Payer: Self-pay | Admitting: Internal Medicine

## 2014-11-12 ENCOUNTER — Telehealth: Payer: Self-pay | Admitting: Family Medicine

## 2014-11-12 MED ORDER — CYCLOBENZAPRINE HCL 10 MG PO TABS: 10.0000 mg | ORAL_TABLET | Freq: Three times a day (TID) | ORAL | Status: DC | PRN

## 2014-11-12 NOTE — Telephone Encounter (Signed)
Have her set up an appointment to see me next week

## 2014-11-12 NOTE — Telephone Encounter (Signed)
She wants to be called @ [phone]

## 2014-11-12 NOTE — Telephone Encounter (Signed)
Pt was told she needed an appt for next week to see you but pt states she is having trouble walking. Having trouble sleeping and wants to know what to do now

## 2014-11-12 NOTE — Telephone Encounter (Signed)
Pt says her leg is worse. Not improving.

## 2014-11-12 NOTE — Telephone Encounter (Signed)
i told pt she needed to come in to be evaluated next week but she wanted to know what she could do over the weekend. I asked Dr.Lalonde about what she could take or do. He said to call in flexeril 10mg  1- TID as needed #12. I told her she could also elevate, put heat on it, do stretches to help with the muscle. Pt asked me send in med but not sure if was going to take it or not but wanted it just in case

## 2014-11-12 NOTE — Telephone Encounter (Signed)
Left a detailed message for pt to call and schedule an appt with Dr. Redmond School

## 2014-11-12 NOTE — Telephone Encounter (Signed)
Have her come and see me next week. I can't really do much without further evaluation

## 2014-11-17 ENCOUNTER — Ambulatory Visit (INDEPENDENT_AMBULATORY_CARE_PROVIDER_SITE_OTHER): Payer: Managed Care, Other (non HMO) | Admitting: Family Medicine

## 2014-11-17 ENCOUNTER — Encounter: Payer: Self-pay | Admitting: Family Medicine

## 2014-11-17 VITALS — BP 128/82 | HR 72 | Ht 68.0 in | Wt 172.0 lb

## 2014-11-17 DIAGNOSIS — M79605 Pain in left leg: Secondary | ICD-10-CM

## 2014-11-17 MED ORDER — METHYLPREDNISOLONE (PAK) 4 MG PO TABS: ORAL_TABLET | ORAL | Status: DC

## 2014-11-17 NOTE — Patient Instructions (Signed)
  Start the steroid pack today. Stop the flexeril during the day.  If needed, you may still use it at bedtime (this might counteract the insomnia from the steroid). You may continue the tramadol twice daily as you have been taking.  You can also use tylenol along with it, if needed.  We discussed potentially increasing the gabapentin, but let's see if the steroid is effective.  You may or may not want to follow up with your back doctor if persistent/worsening leg pain.  It is possibly you may need EMG/nerve conduction studies to further evaluate your pain.

## 2014-11-17 NOTE — Progress Notes (Signed)
Chief Complaint  Patient presents with  . Leg Pain    left leg pain over 1 week. Saw Dr.Lalonde last Tuesday, by Friday was worse so he called her in flexeril, helps her sleep through the night but she is still having pain.    She had a pain start in her left calf several days after her surgery (chemical sphincterotomy for rectal fissure/bleeding).  She had some achiness and numbness in left arm and leg, surgeon said wasn't related to surgery.  She saw Dr. Redmond School last week, who reassured her it wasn't a DVT.  Pain has been crampy.  He ended up starting her on flexeril later in the week.  This has helped her sleep through the night.  Still has some cramping in the morning.  She is taking it 3 times daily, and it makes her just a little sleepy (has taken some naps).  She finds that she needs to get up and move around during the day in order to alleviate the crampy feeling in her leg. At night her pain is worse, wakes her up from sleep (not waking her when taking flexeril).  Pain is crampy, not as bad as a charlie horse.  Needs to move around to get it to feel better.  It was painful when her husband/daughter was massaging her legs.  Pain feels very deep,not tender with light touch/palpation.  She is on gabapentin for her back.  She was on a higher dose, but was able to decrease after having a cortisone shot.  She currently takes 100mg  at bedtime.  She uses tramadol for pain--she took 3/day post-operatively, but she is back to her routine of 2/day.  She can't take ibuprofen (makes her back feel inflamed).  Back pain has been controlled on this regimen.  She sees Dr. Rodell Perna for her back.  Less painful to have bowel movements, but still has some bleeding.  Surgery was 2 weeks ago; scheduled to f/u 2/29.  PMH, PSH, SH reviewed.  Outpatient Encounter Prescriptions as of 11/17/2014  Medication Sig Note  . cyclobenzaprine (FLEXERIL) 10 MG tablet Take 1 tablet (10 mg total) by mouth 3 (three) times daily  as needed for muscle spasms.   Marland Kitchen gabapentin (NEURONTIN) 100 MG capsule Take 100 mg by mouth daily.   Marland Kitchen lidocaine (XYLOCAINE) 5 % ointment Apply 1 application topically as needed.   . Multiple Vitamins-Minerals (MULTIVITAMIN WITH MINERALS) tablet Take 1 tablet by mouth daily.   . norethindrone-ethinyl estradiol (FEMHRT 1/5) 1-5 MG-MCG TABS Take 1 tablet by mouth daily.   . polyethylene glycol (MIRALAX / GLYCOLAX) packet Take 17 g by mouth daily.   . traMADol (ULTRAM) 50 MG tablet Take 50 mg by mouth 2 (two) times daily.   . [DISCONTINUED] Krill Oil 300 MG CAPS Take 1 capsule by mouth daily. 10/29/2014: Not taking  . methylPREDNIsolone (MEDROL DOSPACK) 4 MG tablet follow package directions   . oxyCODONE (OXY IR/ROXICODONE) 5 MG immediate release tablet Take 1 tablet (5 mg total) by mouth every 4 (four) hours as needed. (Patient not taking: Reported on 11/17/2014)   . zolpidem (AMBIEN CR) 12.5 MG CR tablet Take 1 tablet (12.5 mg total) by mouth at bedtime as needed for sleep. (Patient not taking: Reported on 11/17/2014) 11/17/2014: Hasn't been needing  . [DISCONTINUED] cetirizine (ZYRTEC) 10 MG tablet Take 10 mg by mouth daily. 10/29/2014: Not taking   . [DISCONTINUED] estrogens conjugated, synthetic B, (ENJUVIA) 0.3 MG tablet Take 0.3 mg by mouth daily.   . [  DISCONTINUED] Melatonin 5 MG CAPS Take 1 capsule by mouth at bedtime. 10/29/2014: Not taking   (medrol dosepack rx'd today, not prior to visit)  No Known Allergies  ROS:  No fevers, chills.  Denies neck pain.  Slight sniffles/URI symptoms last week, currently without URI complaints.  No cough, shortness of breath, nausea, vomiting. Taking stool softeners and bowels have been normal. No bruising, weakness, chest pain, palpitations, shortness of breath or other problems, other than noted in HPI  PHYSICAL EXAM: BP 128/82 mmHg  Pulse 72  Ht 5\' 8"  (1.727 m)  Wt 172 lb (78.019 kg)  BMI 26.16 kg/m2  Well developed, pleasant female.  She doesn't appear  to be in distress, but appears to be rubbing her left leg frequently throughout the visit, and getting up and moving around rather than sitting for prolonged periods. Back: no spine or CVA tenderness.  No muscle spasm.  Slight discomfort in lower left buttock.  Slight discomfort with pyriformis stretch, but no spasm.  Leg is nontender to palpation, no muscle spasm, no cords, negative Homan Neuro: alert and oriented.  Normal strength, sensation, DTR's and gait Psych: normal mood, affect, hygiene and grooming  ASSESSMENT/PLAN: Left leg pain - suspect radiculopathy - Plan: methylPREDNIsolone (MEDROL DOSPACK) 4 MG tablet  Discussed that her pain sounds radicular, possibly related to position change with surgery. Discussed increasing neurontin dose vs trial of short course of steroids.   Prefers steroids. Risks/side effects were reviewed in detail  Has wellness exam next week--will f/u then.   Start the steroid pack today. Stop the flexeril during the day.  If needed, you may still use it at bedtime (this might counteract the insomnia from the steroid). You may continue the tramadol twice daily as you have been taking.  You can also use tylenol along with it, if needed.  We discussed potentially increasing the gabapentin, but let's see if the steroid is effective.  You may or may not want to follow up with your back doctor if persistent/worsening leg pain.  It is possibly you may need EMG/nerve conduction studies to further evaluate your pain.

## 2014-11-24 ENCOUNTER — Encounter: Payer: Self-pay | Admitting: Family Medicine

## 2014-11-24 ENCOUNTER — Ambulatory Visit (INDEPENDENT_AMBULATORY_CARE_PROVIDER_SITE_OTHER): Payer: Managed Care, Other (non HMO) | Admitting: Family Medicine

## 2014-11-24 VITALS — BP 120/74 | HR 80 | Ht 68.0 in | Wt 167.0 lb

## 2014-11-24 DIAGNOSIS — G8929 Other chronic pain: Secondary | ICD-10-CM | POA: Diagnosis not present

## 2014-11-24 DIAGNOSIS — K625 Hemorrhage of anus and rectum: Secondary | ICD-10-CM | POA: Diagnosis not present

## 2014-11-24 DIAGNOSIS — R5383 Other fatigue: Secondary | ICD-10-CM | POA: Diagnosis not present

## 2014-11-24 DIAGNOSIS — E049 Nontoxic goiter, unspecified: Secondary | ICD-10-CM

## 2014-11-24 DIAGNOSIS — N959 Unspecified menopausal and perimenopausal disorder: Secondary | ICD-10-CM

## 2014-11-24 DIAGNOSIS — Z0000 Encounter for general adult medical examination without abnormal findings: Secondary | ICD-10-CM

## 2014-11-24 DIAGNOSIS — M549 Dorsalgia, unspecified: Secondary | ICD-10-CM

## 2014-11-24 LAB — CBC WITH DIFFERENTIAL/PLATELET
BASOS ABS: 0 10*3/uL (ref 0.0–0.1)
BASOS PCT: 0 % (ref 0–1)
EOS PCT: 1 % (ref 0–5)
Eosinophils Absolute: 0.1 10*3/uL (ref 0.0–0.7)
HCT: 44.6 % (ref 36.0–46.0)
Hemoglobin: 14.9 g/dL (ref 12.0–15.0)
Lymphocytes Relative: 25 % (ref 12–46)
Lymphs Abs: 2.2 10*3/uL (ref 0.7–4.0)
MCH: 31.3 pg (ref 26.0–34.0)
MCHC: 33.4 g/dL (ref 30.0–36.0)
MCV: 93.7 fL (ref 78.0–100.0)
MONO ABS: 0.5 10*3/uL (ref 0.1–1.0)
MPV: 9.4 fL (ref 8.6–12.4)
Monocytes Relative: 6 % (ref 3–12)
Neutro Abs: 5.8 10*3/uL (ref 1.7–7.7)
Neutrophils Relative %: 68 % (ref 43–77)
PLATELETS: 355 10*3/uL (ref 150–400)
RBC: 4.76 MIL/uL (ref 3.87–5.11)
RDW: 12.8 % (ref 11.5–15.5)
WBC: 8.6 10*3/uL (ref 4.0–10.5)

## 2014-11-24 LAB — POCT URINALYSIS DIPSTICK
BILIRUBIN UA: NEGATIVE
Glucose, UA: NEGATIVE
Ketones, UA: NEGATIVE
LEUKOCYTES UA: NEGATIVE
NITRITE UA: NEGATIVE
Protein, UA: NEGATIVE
RBC UA: NEGATIVE
Spec Grav, UA: 1.02
Urobilinogen, UA: NEGATIVE
pH, UA: 6

## 2014-11-24 MED ORDER — GABAPENTIN 300 MG PO CAPS: 300.0000 mg | ORAL_CAPSULE | Freq: Three times a day (TID) | ORAL | Status: DC

## 2014-11-24 NOTE — Patient Instructions (Signed)
  HEALTH MAINTENANCE RECOMMENDATIONS:  It is recommended that you get at least 30 minutes of aerobic exercise at least 5 days/week (for weight loss, you may need as much as 60-90 minutes). This can be any activity that gets your heart rate up. This can be divided in 10-15 minute intervals if needed, but try and build up your endurance at least once a week.  Weight bearing exercise is also recommended twice weekly.  Eat a healthy diet with lots of vegetables, fruits and fiber.  "Colorful" foods have a lot of vitamins (ie green vegetables, tomatoes, red peppers, etc).  Limit sweet tea, regular sodas and alcoholic beverages, all of which has a lot of calories and sugar.  Up to 1 alcoholic drink daily may be beneficial for women (unless trying to lose weight, watch sugars).  Drink a lot of water.  Calcium recommendations are 1200-1500 mg daily (1500 mg for postmenopausal women or women without ovaries), and vitamin D 1000 IU daily.  This should be obtained from diet and/or supplements (vitamins), and calcium should not be taken all at once, but in divided doses.  Monthly self breast exams and yearly mammograms for women over the age of 57 is recommended.  Sunscreen of at least SPF 30 should be used on all sun-exposed parts of the skin when outside between the hours of 10 am and 4 pm (not just when at beach or pool, but even with exercise, golf, tennis, and yard work!)  Use a sunscreen that says "broad spectrum" so it covers both UVA and UVB rays, and make sure to reapply every 1-2 hours.  Remember to change the batteries in your smoke detectors when changing your clock times in the spring and fall.  Use your seat belt every time you are in a car, and please drive safely and not be distracted with cell phones and texting while driving.   Restart meloxicam (if you don't have at home, call me for prescription--let us know if it was 7.5mg  or 15mg ).  Take it just once daily, with food.  You may use tylenol  with this, if needed for any additional pain, and you can take tramadol with it--I wouldn't take the tramadol regularly preventatively, just as needed.  Increase the gabapentin to 3 tablets at bedtime tonight.  If not too groggy in the morning, then take it 100mg  in the morning, 100mg  mid-day, and 300mg  at night.  Over the next week, increase it gradually to 300mg  three times daily.  After a month at this dose, we might need to further increase if your pain isn't well controlled with this regimen (anti-inflammatory once daily, neurontin 300mg  three times/day, and trying to use the tramadol just sparingly, not regularly daily.  If your pain isn't significantly better on this plan, the other options include Cymbalta, as we discussed, and also referring you to another physician for management of your back pain.  Dr. Nelva Bush is one that I recommend.  Consider seeing [facility] (Dr. Hardin Negus) for treatment (he does Active Release Technique, ART)

## 2014-11-24 NOTE — Progress Notes (Signed)
Chief Complaint  Patient presents with  . Annual Exam    fasting annual exam with pap. Requesting full review of health:back and nerve pain, menstual:hot flashes, weight, other. Medication in general.    Barbara Oconnor is a 49 y.o. female who presents for a complete physical.  She has the following concerns:  Leg pain significantly improved after taking medrol dosepak (see last visit).  She went to Dr. Philis Pique, last seen 7 months ago. She was told that her had blood test showing full menopause.  She hasn't had a period in the last year (with irregular spotting prior to that). She was given hormones to help with hot flashes.  They had improved, but she had some last week, and some last night--less intense.  Hot flashes more since she was on the steroid pack (since her last visit). She still sometimes gets irritable. She has also noticed hot flashes if she misses a pill of HRT.  Ongoing back pain x 4 years.  She had 2 cortisone injections that didn't help all that much, pain just changed some.  She doesn't have pain with exercise, but has pain afterwards, and limits what she does.  She is wondering if she "should just take tramadol 2-3 times/day" to get better control of her pain.  Has been seeing Dr. Lorin Mercy, but would like another opinion.  "not bad enough for surgery".  Last PT 08/2014 at [facility].  They did biofeedback, massage, exercises.  She doesn't recall getting electrical stimulation (has had in the past from chiropractor).  "I hate taking all this medication" but at the same time wants to be taking tramadol 3x/d--because it works.  Anemia:  Her Hg was 8.6 on day of surgical procedure (2/11). She has been having ongoing rectal bleeding related to anal fissures. Some ongoing bleeding since procedure.  Goiter:  Hasn't seen Dr. Buddy Duty in years. Denies any change in size, trouble swallowing. Denies hair/skin/bowel changes.  She started taking hair/nail vitamin because her nails  feel more brittle, soft/ripping.   Immunization History  Administered Date(s) Administered  . Influenza Split 08/01/2010, 06/15/2011, 07/01/2013, 06/24/2014  . Tdap 04/06/2011   Last Pap smear: 05/2013 (but had GYN exam about 7 months ago) Last mammogram: 05/2013 (in chart)--states she had one last year as well, through Dr. Philis Pique Last colonoscopy: 2011 Last DEXA: never Dentist: twice year Ophtho: every other year, last Fall 2015 Exercise: Teaches pilates once weekly, mows yard. Limited by her back pain Lab Results  Component Value Date   CHOL 139 07/23/2013   HDL 66 07/23/2013   LDLCALC 53 07/23/2013   TRIG 101 07/23/2013   CHOLHDL 2.1 07/23/2013   Past Medical History  Diagnosis Date  . Low back pain 10/2010  . Goiter 2002    u/s 2002, 2010 (Dr. Buddy Duty)  . Multiple thyroid nodules   . Complication of anesthesia     slow awakening  . H/O motion sickness   . Rectal bleeding     anal fissure    Past Surgical History  Procedure Laterality Date  . Tonsillectomy  1986  . Tear duct probing  2006    left  . Hemorrhoid banding    . Colonoscopy  03/2010    [facility]); benign polyp, told to repeat 10 years  . Sphincterotomy N/A 11/04/2014    Procedure: CHEMICAL SPHINCTEROTOMY WITH BOTOX/ fisurectomy;  Surgeon: Leighton Ruff, MD;  Location: [facility];  Service: General;  Laterality: N/A;  . Examination under anesthesia N/A 11/04/2014  Procedure: EXAM UNDER ANESTHESIA;  Surgeon: Leighton Ruff, MD;  Location: [facility];  Service: General;  Laterality: N/A;    History   Social History  . Marital Status: Married    Spouse Name: N/A  . Number of Children: 2  . Years of Education: N/A   Occupational History  . substitute Pharmacist, hospital; also teaches [place] History Main Topics  . Smoking status: Never Smoker   . Smokeless tobacco: Never Used  . Alcohol Use: Yes     Comment: occasional  . Drug Use: No  .  Sexual Activity:    Partners: Male    Birth Control/ Protection: Surgical     Comment: husband had vasectomy   Other Topics Concern  . Not on file   Social History Narrative   Lives with husband and 1 son, 1 daughter. No pets   Daughter has chronic migraines.  Takes classes at [employer] (and online courses)    Family History  Problem Relation Age of Onset  . Cancer Mother 88    breast cancer  . Depression Mother   . Hyperlipidemia Father   . Hypertension Father   . Bipolar disorder Sister   . Cancer Maternal Aunt     thyroid cancer  . Cancer Paternal Uncle     lymphatic  . Diabetes Paternal Grandmother   . Heart disease Paternal Grandfather     Outpatient Encounter Prescriptions as of 11/24/2014  Medication Sig Note  . gabapentin (NEURONTIN) 100 MG capsule Take 100 mg by mouth daily.   . Multiple Vitamins-Minerals (HAIR/SKIN/NAILS PO) Take 1 tablet by mouth daily.   . Multiple Vitamins-Minerals (MULTIVITAMIN WITH MINERALS) tablet Take 1 tablet by mouth daily.   . norethindrone-ethinyl estradiol (FEMHRT 1/5) 1-5 MG-MCG TABS Take 1 tablet by mouth daily.   . polyethylene glycol (MIRALAX / GLYCOLAX) packet Take 17 g by mouth daily.   . traMADol (ULTRAM) 50 MG tablet Take 50 mg by mouth 2 (two) times daily.   . cyclobenzaprine (FLEXERIL) 10 MG tablet Take 1 tablet (10 mg total) by mouth 3 (three) times daily as needed for muscle spasms. (Patient not taking: Reported on 11/24/2014)   . gabapentin (NEURONTIN) 300 MG capsule Take 1 capsule (300 mg total) by mouth 3 (three) times daily.   Marland Kitchen lidocaine (XYLOCAINE) 5 % ointment Apply 1 application topically as needed.   . zolpidem (AMBIEN CR) 12.5 MG CR tablet Take 1 tablet (12.5 mg total) by mouth at bedtime as needed for sleep. (Patient not taking: Reported on 11/17/2014) 11/17/2014: Hasn't been needing  . [DISCONTINUED] methylPREDNIsolone (MEDROL DOSPACK) 4 MG tablet follow package directions   . [DISCONTINUED] oxyCODONE (OXY IR/ROXICODONE) 5  MG immediate release tablet Take 1 tablet (5 mg total) by mouth every 4 (four) hours as needed. (Patient not taking: Reported on 11/17/2014)     No Known Allergies  ROS: The patient denies anorexia, fever, headaches, vision changes, decreased hearing, ear pain, sore throat, breast concerns, chest pain, palpitations, dizziness, syncope, dyspnea on exertion, cough, swelling, nausea, vomiting, diarrhea, abdominal pain, melena, indigestion/heartburn, hematuria, incontinence, dysuria, vaginal discharge, odor or itch, genital lesions, joint pains (just the back), numbness, tingling, weakness, tremor, suspicious skin lesions, depression, anxiety, abnormal bleeding/bruising, or enlarged lymph nodes.  Menopausal--no periods in a year (see HPI) Mild seasonal allergies, uses generic claritin prn (rare), Neti-pot prn. +constipation--controlled by Miralax. +BRBPR, still some ongoing since her recent anal surgery +ongoing low back pain, chronic. she "can't take ibuprofen"--inflames  back more.  Aleve constipates her. Taking tramadol daily.  PHYSICAL EXAM:  BP 120/74 mmHg  Pulse 80  Ht _0  (1.727 m)  Wt 167 lb (75.751 kg)  BMI 25.40 kg/m2  General Appearance:  Alert, cooperative, no distress, appears stated age.  Head:  Normocephalic, without obvious abnormality, atraumatic   Eyes:  PERRL, conjunctiva/corneas clear, EOM's intact, fundi  benign   Ears:  Normal TM's and external ear canals   Nose:  Nares normal, mucosa normal, no drainage or sinus tenderness   Throat:  Lips, mucosa, and tongue normal; teeth and gums normal   Neck:  Supple, no lymphadenopathy; thyroid: Small thyroid nodule R lower lobe,and some generalized enlargement of thyroid; nontender. no carotid bruit or JVD   Back:  Mildly diffusely tender over lower lumbar spine, at left SI joint and muscles. No CVA tenderness.   Lungs:  Clear to auscultation bilaterally without wheezes, rales or ronchi; respirations unlabored    Chest Wall:  No tenderness or deformity   Heart:  Regular rate and rhythm, S1 and S2 normal, no murmur, rub  or gallop   Breast Exam:  Deferred to GYN  Abdomen:  Soft, non-tender, nondistended, normoactive bowel sounds,  no masses, no hepatosplenomegaly   Genitalia:  Deferred to GYN  Rectal:  Deferred   Extremities:  No clubbing, cyanosis or edema. Right great toe--discolored and slightly thickened  Pulses:  2+ and symmetric all extremities   Skin:  Skin color, texture, turgor normal, no rashes or lesions   Lymph nodes:  Cervical, supraclavicular, and axillary nodes normal   Neurologic:  CNII-XII intact, normal strength, sensation and gait; reflexes 2+ and symmetric throughout    Psych:  Normal mood, affect, hygiene and grooming.         ASSESSMENT/PLAN:  Annual physical exam - Plan: Visual acuity screening, POCT Urinalysis Dipstick, Comprehensive metabolic panel, CBC with Differential/Platelet, TSH  Other fatigue - Plan: Comprehensive metabolic panel, CBC with Differential/Platelet, TSH  Rectal bleeding - Plan: CBC with Differential/Platelet  Goiter - Plan: TSH  Chronic back pain - counseled re: risks of tramadol. decrease to prn; re-try mobic. increase gabapentin to 315m TID. consider referral to Dr. RNelva Bush- Plan: gabapentin (NEURONTIN) 300 MG capsule  Postmenopausal symptoms - all questions answered. [facility] Discussed monthly self breast exams and yearly mammograms; at least 30 minutes of aerobic activity at least 5 days/week; proper sunscreen use reviewed; healthy diet, including goals of calcium and vitamin D intake and alcohol recommendations (less than or equal to 1 drink/day) reviewed; regular seatbelt use; changing batteries in smoke detectors. Immunization recommendations discussed--UTD. Colonoscopy recommendations reviewed, UTD   Restart meloxicam (if you don't have at home, call me for  prescription--let uKoreaknow if it was 7.551mor 1571m  Take it just once daily, with food.  You may use tylenol with this, if needed for any additional pain, and you can take tramadol with it--I wouldn't take the tramadol regularly preventatively, just as needed.  Increase the gabapentin to 3 tablets at bedtime tonight.  If not too groggy in the morning, then take it 100m40m the morning, 100mg64m-day, and 300mg 20might.  Over the next week, increase it gradually to 300mg t65m times daily.  After a month at this dose, we might need to further increase if your pain isn't well controlled with this regimen (anti-inflammatory once daily, neurontin 300mg th14mtimes/day, and trying to use the tramadol just sparingly, not regularly daily.  If  your pain isn't significantly better on this plan, the other options include Cymbalta, as we discussed, and also referring you to another physician for management of your back pain.  Dr. Nelva Bush is one that I recommend.   neurontin 372m  CBC, TSH, c-,met Declines HIV

## 2014-11-25 LAB — COMPREHENSIVE METABOLIC PANEL
ALT: 14 U/L (ref 0–35)
AST: 15 U/L (ref 0–37)
Albumin: 4.4 g/dL (ref 3.5–5.2)
Alkaline Phosphatase: 73 U/L (ref 39–117)
BUN: 11 mg/dL (ref 6–23)
CALCIUM: 9.2 mg/dL (ref 8.4–10.5)
CHLORIDE: 103 meq/L (ref 96–112)
CO2: 23 meq/L (ref 19–32)
CREATININE: 0.82 mg/dL (ref 0.50–1.10)
GLUCOSE: 87 mg/dL (ref 70–99)
Potassium: 4.4 mEq/L (ref 3.5–5.3)
Sodium: 138 mEq/L (ref 135–145)
Total Bilirubin: 0.5 mg/dL (ref 0.2–1.2)
Total Protein: 7.2 g/dL (ref 6.0–8.3)

## 2014-11-25 LAB — TSH: TSH: 0.522 u[IU]/mL (ref 0.350–4.500)

## 2014-11-26 ENCOUNTER — Encounter: Payer: Self-pay | Admitting: Family Medicine

## 2014-12-01 ENCOUNTER — Telehealth: Payer: Self-pay | Admitting: Family Medicine

## 2014-12-01 DIAGNOSIS — M545 Low back pain: Secondary | ICD-10-CM

## 2014-12-01 MED ORDER — MELOXICAM 15 MG PO TABS: 15.0000 mg | ORAL_TABLET | Freq: Every day | ORAL | Status: DC

## 2014-12-01 NOTE — Telephone Encounter (Signed)
Requesting script for Meloxicam. Pt says she was to call when she was ready for script to be sent to pharmacy

## 2014-12-01 NOTE — Telephone Encounter (Signed)
done

## 2015-03-11 ENCOUNTER — Ambulatory Visit: Payer: Managed Care, Other (non HMO) | Admitting: Medical

## 2015-04-09 ENCOUNTER — Encounter: Payer: Self-pay | Admitting: Family Medicine

## 2015-06-01 ENCOUNTER — Ambulatory Visit (INDEPENDENT_AMBULATORY_CARE_PROVIDER_SITE_OTHER): Payer: Managed Care, Other (non HMO) | Admitting: Family Medicine

## 2015-06-01 ENCOUNTER — Encounter: Payer: Self-pay | Admitting: Family Medicine

## 2015-06-01 VITALS — BP 130/72 | HR 80 | Ht 67.75 in | Wt 174.0 lb

## 2015-06-01 DIAGNOSIS — Z23 Encounter for immunization: Secondary | ICD-10-CM | POA: Diagnosis not present

## 2015-06-01 DIAGNOSIS — M549 Dorsalgia, unspecified: Secondary | ICD-10-CM | POA: Diagnosis not present

## 2015-06-01 DIAGNOSIS — G8929 Other chronic pain: Secondary | ICD-10-CM | POA: Diagnosis not present

## 2015-06-01 MED ORDER — GABAPENTIN 300 MG PO CAPS: 300.0000 mg | ORAL_CAPSULE | Freq: Three times a day (TID) | ORAL | Status: DC

## 2015-06-01 NOTE — Progress Notes (Signed)
Chief Complaint  Patient presents with  . Med check    for chronic back pain, takes neurontin.    Patient presents to follow up on back pain.  She has some pain every day, but not all day. Better with good posture, getting up frequently from prolonged sitting.  Since taking the neurontin, she can sleep easier at night, pain doesn't keep her up anymore.  Since taking 2 neurontin at night, she feels very relaxed at night, sleeps well (can only take 1 if going to bed late and needs to get up early). No longer having any radiating pain.  Her back pain is 3/10, tolerable. The meloxicam had made her back pain worse (see MyChart message 03/2015). 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  Hasn't been getting any regular exercise.  She has been considering joining a gym. She was doing gardening over the weekend, and pain flared some, needed a tramadol.  One other time she had pain flare requiring tramadol for a few days after heavy lifting.  She hasn't had any leg pain since taking the steroid pack prior to her last visit 6 months ago.  PMH, PSH, SH reviewed.  Outpatient Encounter Prescriptions as of 06/01/2015  Medication Sig Note  . gabapentin (NEURONTIN) 300 MG capsule Take 1 capsule (300 mg total) by mouth 3 (three) times daily.   . norethindrone-ethinyl estradiol (FEMHRT 1/5) 1-5 MG-MCG TABS Take 1 tablet by mouth daily.   . polyethylene glycol (MIRALAX / GLYCOLAX) packet Take 17 g by mouth daily.   . traMADol (ULTRAM) 50 MG tablet Take 50 mg by mouth 2 (two) times daily.   . [DISCONTINUED] gabapentin (NEURONTIN) 300 MG capsule Take 1 capsule (300 mg total) by mouth 3 (three) times daily.   . cyclobenzaprine (FLEXERIL) 10 MG tablet Take 1 tablet (10 mg total) by mouth 3 (three) times daily as needed for muscle spasms. (Patient not taking: Reported on 11/24/2014)   . [DISCONTINUED] gabapentin (NEURONTIN) 100 MG capsule Take 100 mg by mouth  daily.   . [DISCONTINUED] lidocaine (XYLOCAINE) 5 % ointment Apply 1 application topically as needed.   . [DISCONTINUED] meloxicam (MOBIC) 15 MG tablet Take 1 tablet (15 mg total) by mouth daily.   . [DISCONTINUED] Multiple Vitamins-Minerals (HAIR/SKIN/NAILS PO) Take 1 tablet by mouth daily.   . [DISCONTINUED] Multiple Vitamins-Minerals (MULTIVITAMIN WITH MINERALS) tablet Take 1 tablet by mouth daily.   . [DISCONTINUED] zolpidem (AMBIEN CR) 12.5 MG CR tablet Take 1 tablet (12.5 mg total) by mouth at bedtime as needed for sleep. (Patient not taking: Reported on 11/17/2014) 11/17/2014: Hasn't been needing   No facility-administered encounter medications on file as of 06/01/2015.   No Known Allergies  ROS:  No fever, chills, URI symptoms, headaches, dizziness, GI complaints, urinary complaints, numbness/tingling/weakness, bleeding/bruising/rash. Moods are good. See HPI.  PHYSICAL EXAM: BP 130/72 mmHg  Pulse 80  Ht 5' 7.75" (1.721 m)  Wt 174 lb (78.926 kg)  BMI 26.65 kg/m2  Well developed, well nourished, pleasant female in no distress HEENT: PERRL, EOMI, conjunctiva clear Neck: borderline thyroid size, no mass or lymphadenopathy Heart: regular rate and rhythm Lungs: clear bilaterally Back: no spinal tenderness or muscle spasm Neuro: alert and oriented. Cranial nerves intact. Normal strength, sensation, gait, DTR's 2+ bilaterally Skin: no rashes/bruising noted  ASSESSMENT/PLAN:  Chronic back pain - controlled with neurontin - Plan: gabapentin (NEURONTIN) 300 MG capsule  Need for prophylactic vaccination and inoculation against influenza -  Plan: Flu Vaccine QUAD 36+ mos PF IM (Fluarix & Fluzone Quad PF)   Encouraged to continue regular core strengthening (pilates/yoga). Encouraged to get regular cardio (discussed swimming, recumbent bike, and other options).  Continue the neurontin, and call if/when tramadol refill is needed (previously rx'd by ortho)   F/u at CPE, sooner prn (and  if continues to do well, just needs yearly visits)

## 2015-06-01 NOTE — Patient Instructions (Signed)
Continue your current medications. Call when you need a tramadol refill. Continue core strengthening, and try and get 150 minutes/week of aerobic exercise (cardio), as well as weight-bearing exercise at least 2x/week.

## 2015-11-08 ENCOUNTER — Telehealth: Payer: Self-pay | Admitting: Family Medicine

## 2015-11-08 NOTE — Telephone Encounter (Signed)
Left message for pt to call. Pt has two cpe's on the books, 11/28/2015 and 11/30/2015. Need to know which one she would like.

## 2015-11-28 ENCOUNTER — Encounter: Payer: Self-pay | Admitting: Family Medicine

## 2015-11-30 ENCOUNTER — Ambulatory Visit (INDEPENDENT_AMBULATORY_CARE_PROVIDER_SITE_OTHER): Payer: Managed Care, Other (non HMO) | Admitting: Family Medicine

## 2015-11-30 ENCOUNTER — Encounter: Payer: Self-pay | Admitting: Family Medicine

## 2015-11-30 VITALS — BP 110/68 | HR 72 | Ht 67.75 in | Wt 163.8 lb

## 2015-11-30 DIAGNOSIS — K625 Hemorrhage of anus and rectum: Secondary | ICD-10-CM | POA: Diagnosis not present

## 2015-11-30 DIAGNOSIS — G8929 Other chronic pain: Secondary | ICD-10-CM

## 2015-11-30 DIAGNOSIS — Z0000 Encounter for general adult medical examination without abnormal findings: Secondary | ICD-10-CM

## 2015-11-30 DIAGNOSIS — M549 Dorsalgia, unspecified: Secondary | ICD-10-CM

## 2015-11-30 DIAGNOSIS — E049 Nontoxic goiter, unspecified: Secondary | ICD-10-CM | POA: Diagnosis not present

## 2015-11-30 DIAGNOSIS — R5383 Other fatigue: Secondary | ICD-10-CM

## 2015-11-30 LAB — CBC WITH DIFFERENTIAL/PLATELET
BASOS ABS: 0 10*3/uL (ref 0.0–0.1)
Basophils Relative: 0 % (ref 0–1)
EOS ABS: 0 10*3/uL (ref 0.0–0.7)
EOS PCT: 1 % (ref 0–5)
HEMATOCRIT: 41.4 % (ref 36.0–46.0)
Hemoglobin: 14 g/dL (ref 12.0–15.0)
LYMPHS ABS: 0.9 10*3/uL (ref 0.7–4.0)
LYMPHS PCT: 23 % (ref 12–46)
MCH: 31.7 pg (ref 26.0–34.0)
MCHC: 33.8 g/dL (ref 30.0–36.0)
MCV: 93.9 fL (ref 78.0–100.0)
MPV: 10.1 fL (ref 8.6–12.4)
Monocytes Absolute: 0.5 10*3/uL (ref 0.1–1.0)
Monocytes Relative: 13 % — ABNORMAL HIGH (ref 3–12)
NEUTROS PCT: 63 % (ref 43–77)
Neutro Abs: 2.3 10*3/uL (ref 1.7–7.7)
Platelets: 232 10*3/uL (ref 150–400)
RBC: 4.41 MIL/uL (ref 3.87–5.11)
RDW: 13.3 % (ref 11.5–15.5)
WBC: 3.7 10*3/uL — AB (ref 4.0–10.5)

## 2015-11-30 LAB — LIPID PANEL
CHOL/HDL RATIO: 2.7 ratio (ref <=5.0)
CHOLESTEROL: 138 mg/dL (ref 125–200)
HDL: 52 mg/dL (ref >=46)
LDL Cholesterol: 76 mg/dL (ref <130)
TRIGLYCERIDES: 50 mg/dL (ref <150)
VLDL: 10 mg/dL (ref <30)

## 2015-11-30 LAB — COMPREHENSIVE METABOLIC PANEL
ALT: 13 U/L (ref 6–29)
AST: 20 U/L (ref 10–35)
Albumin: 4.4 g/dL (ref 3.6–5.1)
Alkaline Phosphatase: 73 U/L (ref 33–115)
BUN: 11 mg/dL (ref 7–25)
CALCIUM: 9 mg/dL (ref 8.6–10.2)
CHLORIDE: 102 mmol/L (ref 98–110)
CO2: 26 mmol/L (ref 20–31)
CREATININE: 0.91 mg/dL (ref 0.50–1.10)
Glucose, Bld: 81 mg/dL (ref 65–99)
Potassium: 4.1 mmol/L (ref 3.5–5.3)
Sodium: 138 mmol/L (ref 135–146)
TOTAL PROTEIN: 7.1 g/dL (ref 6.1–8.1)
Total Bilirubin: 0.4 mg/dL (ref 0.2–1.2)

## 2015-11-30 LAB — POCT URINALYSIS DIPSTICK
Bilirubin, UA: NEGATIVE
Blood, UA: NEGATIVE
Glucose, UA: NEGATIVE
Ketones, UA: NEGATIVE
LEUKOCYTES UA: NEGATIVE
NITRITE UA: NEGATIVE
PH UA: 5.5
PROTEIN UA: NEGATIVE
Spec Grav, UA: 1.015
Urobilinogen, UA: NEGATIVE

## 2015-11-30 LAB — TSH: TSH: 0.63 mIU/L

## 2015-11-30 NOTE — Patient Instructions (Signed)
  HEALTH MAINTENANCE RECOMMENDATIONS:  It is recommended that you get at least 30 minutes of aerobic exercise at least 5 days/week (for weight loss, you may need as much as 60-90 minutes). This can be any activity that gets your heart rate up. This can be divided in 10-15 minute intervals if needed, but try and build up your endurance at least once a week.  Weight bearing exercise is also recommended twice weekly.  Eat a healthy diet with lots of vegetables, fruits and fiber.  "Colorful" foods have a lot of vitamins (ie green vegetables, tomatoes, red peppers, etc).  Limit sweet tea, regular sodas and alcoholic beverages, all of which has a lot of calories and sugar.  Up to 1 alcoholic drink daily may be beneficial for women (unless trying to lose weight, watch sugars).  Drink a lot of water.  Calcium recommendations are 1200-1500 mg daily (1500 mg for postmenopausal women or women without ovaries), and vitamin D 1000 IU daily.  This should be obtained from diet and/or supplements (vitamins), and calcium should not be taken all at once, but in divided doses.  Monthly self breast exams and yearly mammograms for women over the age of 28 is recommended.  Sunscreen of at least SPF 30 should be used on all sun-exposed parts of the skin when outside between the hours of 10 am and 4 pm (not just when at beach or pool, but even with exercise, golf, tennis, and yard work!)  Use a sunscreen that says "broad spectrum" so it covers both UVA and UVB rays, and make sure to reapply every 1-2 hours.  Remember to change the batteries in your smoke detectors when changing your clock times in the spring and fall.  Use your seat belt every time you are in a car, and please drive safely and not be distracted with cell phones and texting while driving.   I did not do you breast or pelvic exam, as your last exam was with Dr. Philis Pique in September 2016.  You should follow up with her in September (and get your mammogram as  well).

## 2015-11-30 NOTE — Progress Notes (Signed)
Chief Complaint  Patient presents with  . Annual Exam    fasting annual exam with pap. Curious about her back and medicine-not getting better. Still has rectal bleeding, thinks it's fissures.     Barbara Oconnor is a 50 y.o. female who presents for a complete physical.  She has the following concerns:  Ongoing back pain:  No longer getting the radiating pain into the legs. She hasn't taken any tramadol in a week. Prior to that she had been using it up to twice daily, sometimes for up to 10 days at a time, probably three out of the last 6 weeks recently. Pain level currently is 2/10.   Since off tramadol, she has been taking hot showers, using heating pad, which helps.   Topical medications made it worse.  Hasn't used Thermacare in a while. She has some pain every day, but not all day. Better with good posture, getting up frequently from prolonged sitting.   Since taking the neurontin, she can sleep easier at night, pain doesn't keep her up anymore. Since taking 2 neurontin at night, she feels very relaxed at night, sleeps well.  Taking 2 at bedtime, and 1 in the morning. She forgot to take the medication 3 times, and will wake up at 3 in the morning (and then takes it).  It bothers her that she cannot lay comfortably on her back.  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 She has modified her pilates to avoid aggravating her back. Ongoing back pain x 5 years. She had 2 cortisone injections in the past, reportedly didn't help all that much, pain just changed some.  She gets tramadol from Dr. Lorin Mercy. New insurance will cover accupuncture, considering re-trying. Has had in past, as well as PT, chiro and injections.  She "can't take ibuprofen"--inflames back more, as does meloxicam. Aleve constipates her, and she seems to have built up a tolerance and no longer helps. Tylenol sometimes will help, if she hasn't had it in a while.  She sees Dr. Philis Pique for GYN care.  She  has reported that in 2015 she was told that her had blood test showed full menopause. She had some bleeding in the past year,  contacted Dr. Philis Pique.  She had 2 episodes of spotting, once lasting 5d, and the other lasted 7 days.  She hasn't had any bleeding in the last 4-5 months.   Last note from Dr. Philis Pique reviewed--05/2015 had annual exam. Small cervical polyp was noted. No pap done.  Refilled hormones for a year. No hot flashes since on HRT. Recurrent symptoms with missed doses.  H/o anemia: Her last Hg was 14.9.  She has some rectal bleeding related to anal fissures.  She was recently on antibiotics for tooth extraction.  She had some loose stools, which irritated the area, and had some recurrent bleeding.  She had Botox to treat fissures, but doesn't really feel she got much benefit.  She changed her wiping habits (now uses moist wipes).  She has some bleeding at least once a month, but only one time in the last 6 months where it was heavy.  Just very light spotting currently from diarrhea from antibiotics.  Goiter: Hasn't seen Dr. Buddy Duty in years. Denies any change in size, trouble swallowing. Denies hair/skin/bowel changes. She started taking hair/nail vitamin because her nails soft/ripping. Hasn't noticed much of a change.  Immunization History  Administered Date(s) Administered  . Influenza Split 08/01/2010, 06/15/2011, 07/01/2013, 06/24/2014  . Influenza,inj,Quad  PF,36+ Mos 06/01/2015  . Tdap 04/06/2011   Last Pap smear: 05/2013 (but had GYN exam about 7 months ago) Last mammogram: 05/2015 through Dr. Philis Pique Last colonoscopy: 2011; she was told to f/u in 10 years Last DEXA: never Dentist: twice yearly Ophtho: every other year, last May 2016 Exercise: Teaches pilates once weekly. Recumbent bike at the gym 2x/week, sometimes walks on treadmill.  Past Medical History  Diagnosis Date  . Low back pain 10/2010  . Goiter 2002    u/s 2002, 2010 (Dr. Buddy Duty)  . Multiple thyroid nodules    . Complication of anesthesia     slow awakening  . H/O motion sickness   . Rectal bleeding     anal fissure    Past Surgical History  Procedure Laterality Date  . Tonsillectomy  1986  . Tear duct probing  2006    left  . Hemorrhoid banding    . Colonoscopy  03/2010    Jule Ser); benign polyp, told to repeat 10 years  . Sphincterotomy N/A 11/04/2014    Procedure: CHEMICAL SPHINCTEROTOMY WITH BOTOX/ fisurectomy;  Surgeon: Leighton Ruff, MD;  Location: [facility];  Service: General;  Laterality: N/A;  . Examination under anesthesia N/A 11/04/2014    Procedure: Jasmine December UNDER ANESTHESIA;  Surgeon: Leighton Ruff, MD;  Location: [facility];  Service: General;  Laterality: N/A;    Social History   Social History  . Marital Status: Married    Spouse Name: N/A  . Number of Children: 2  . Years of Education: N/A   Occupational History  . 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 Smoking status: Never Smoker   . Smokeless tobacco: Never Used  . Alcohol Use: Yes     Comment: occasional  . Drug Use: No  . Sexual Activity:    Partners: Male    Birth Control/ Protection: Surgical     Comment: husband had vasectomy   Other Topics Concern  . Not on file   Social History Narrative   Lives with husband and 1 son, 1 daughter (in college). No pets   Daughter has chronic migraines.   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 Cancer Mother 33    breast cancer  . Hyperlipidemia Father   . Hypertension Father   . Bipolar disorder Sister   . Cancer Maternal Aunt     thyroid cancer  . Cancer Paternal Uncle     lymphatic  . Diabetes Paternal Grandmother   . Heart disease Paternal Grandfather   . Migraines Daughter     Outpatient Encounter Prescriptions as of 11/30/2015  Medication Sig Note  . gabapentin (NEURONTIN) 300 MG capsule Take 1 capsule (300 mg total)  by mouth 3 (three) times daily. 11/30/2015: Take 1 in am, and 2 qHS  . norethindrone-ethinyl estradiol (FEMHRT 1/5) 1-5 MG-MCG TABS Take 1 tablet by mouth daily.   . polyethylene glycol (MIRALAX / GLYCOLAX) packet Take 6 g by mouth daily.    . traMADol (ULTRAM) 50 MG tablet Take 50 mg by mouth 2 (two) times daily. 11/30/2015: Uses prn;  Prescribed by Dr. Lorin Mercy  . [DISCONTINUED] cyclobenzaprine (FLEXERIL) 10 MG tablet Take 1 tablet (10 mg total) by mouth 3 (three) times daily as needed for muscle spasms. (Patient not taking: Reported on 11/24/2014)    No facility-administered encounter medications on file as of  11/30/2015.    No Known Allergies   ROS: The patient denies anorexia, fever, headaches, vision changes, decreased hearing, ear pain, sore throat, breast concerns, chest pain, palpitations, dizziness, syncope, dyspnea on exertion, cough, swelling, nausea, vomiting, diarrhea, abdominal pain, melena, indigestion/heartburn, hematuria, incontinence, dysuria, vaginal discharge, odor or itch, genital lesions, joint pains (just the back), numbness, tingling, weakness, tremor, suspicious skin lesions, depression, anxiety, abnormal bleeding/bruising, or enlarged lymph nodes.  Menopausal--on HRT without any hot flashes +constipation--controlled by Miralax. +BRBPR as per HPI +ongoing low back pain, chronic.  Some sneezing recently Doesn't seem to be getting motion sick/dizzy as easily (since doing detox diet?) Some diarrhea recently from antibiotics    PHYSICAL EXAM:  BP 138/86 mmHg  Pulse 72  Ht 5' 7.75" (1.721 m)  Wt 163 lb 12.8 oz (74.299 kg)  BMI 25.09 kg/m2 110/68 on repeat by MD   General Appearance:  Alert, cooperative, no distress, appears stated age.  Head:  Normocephalic, without obvious abnormality, atraumatic   Eyes:  PERRL, conjunctiva/corneas clear, EOM's intact, fundi  benign   Ears:  Normal TM's and external ear canals   Nose:  Nares normal, mucosa normal,  no drainage or sinus tenderness   Throat:  Lips, mucosa, and tongue normal; teeth and gums normal   Neck:  Supple, no lymphadenopathy; thyroid: Small thyroid nodule R lower lobe,and some generalized enlargement of thyroid; nontender. no carotid bruit or JVD   Back:  Mildly tender at left SI joint; spine nontender, no muscle spasm. No CVA tenderness.   Lungs:  Clear to auscultation bilaterally without wheezes, rales or ronchi; respirations unlabored   Chest Wall:  No tenderness or deformity   Heart:  Regular rate and rhythm, S1 and S2 normal, no murmur, rub  or gallop   Breast Exam:  Deferred to GYN  Abdomen:  Soft, non-tender, nondistended, normoactive bowel sounds,  no masses, no hepatosplenomegaly   Genitalia:  Deferred to GYN  Rectal:  External exam performed--no active bleeding, no inflammation.  Some mild hemorrhoids noted anteriorly, not inflamed.  Extremities:  No clubbing, cyanosis or edema.   Pulses:  2+ and symmetric all extremities   Skin:  Skin color, texture, turgor normal, no rashes or lesions   Lymph nodes:  Cervical, supraclavicular, and axillary nodes normal   Neurologic:  CNII-XII intact, normal strength, sensation and gait; reflexes 2+ and symmetric throughout    Psych: Normal mood, affect, hygiene and grooming             Can't do breast/pelvic as requested by pt--just had done 05/2015 by GYN   ASSESSMENT/PLAN:  Annual physical exam - Plan: POCT Urinalysis Dipstick, Visual acuity screening, Lipid panel, Comprehensive metabolic panel, CBC with Differential/Platelet, VITAMIN D 25 Hydroxy (Vit-D Deficiency, Fractures), TSH  Chronic back pain - continue supportive measures, heat, posture, tylenol, tramadol prn severe pain  Goiter - stable, nodule unchanged - Plan: TSH  Rectal bleeding - recent flare related to diarrhea (suspect more related to hemorrhoids than fissure, given this  history, and exam) - Plan: CBC with Differential/Platelet  Other fatigue - Plan: Comprehensive metabolic panel, CBC with Differential/Platelet, VITAMIN D 25 Hydroxy (Vit-D Deficiency, Fractures), TSH   Discussed monthly self breast exams and yearly mammograms; at least 30 minutes of aerobic activity at least 5 days/week, weight-bearing exercise at least 2-3x/wk; proper sunscreen use reviewed; healthy diet, including goals of calcium and vitamin D intake and alcohol recommendations (less than or equal to 1 drink/day) reviewed; regular seatbelt use; changing batteries in smoke  detectors. Immunization recommendations discussed--UTD. Colonoscopy recommendations reviewed, UTD   Labs (full panel) done today including lipids so she can be nonfasting next year (if it is normal). Recheck vitamin D--no longer getting as much dairy at time of last check in 2014  CBC, c-met, TSH, lipid, D   Discussed pain meds--use tylenol first, tramadol for more severe pain.   F/u 05/2017 if wants to keep all care here (can be late in returning here, to get breast/GYN exam done at her CPE here) If still seeing GYN, can return in 11/2016 (1 year)

## 2015-12-01 ENCOUNTER — Telehealth: Payer: Self-pay | Admitting: Family Medicine

## 2015-12-01 LAB — VITAMIN D 25 HYDROXY (VIT D DEFICIENCY, FRACTURES): Vit D, 25-Hydroxy: 54 ng/mL (ref 30–100)

## 2015-12-01 NOTE — Telephone Encounter (Signed)
ALERT PT HAS A NEW PHARMACY/ pt was seen yest and failed to mention that she had a new pharmacy. Pt does not need refills now but will in April. PT'S [facility]Y IS [facility] [address]. Pt also states that she will need Gabapentin filled 90 days at a time.

## 2015-12-02 NOTE — Telephone Encounter (Signed)
Pharmacy updated in her chart

## 2016-01-11 ENCOUNTER — Other Ambulatory Visit: Payer: Self-pay | Admitting: Family Medicine

## 2016-03-08 ENCOUNTER — Other Ambulatory Visit: Payer: Self-pay | Admitting: Family Medicine

## 2016-03-08 NOTE — Telephone Encounter (Signed)
Is this okay to refill? And for how long. Sent for #90 TID with 1 refill 01/12/16 so she should need more.

## 2016-03-19 ENCOUNTER — Telehealth: Payer: Self-pay | Admitting: Family Medicine

## 2016-03-19 MED ORDER — TRAMADOL HCL 50 MG PO TABS: 50.0000 mg | ORAL_TABLET | Freq: Four times a day (QID) | ORAL | Status: DC | PRN

## 2016-03-19 NOTE — Telephone Encounter (Signed)
Pt called and needs a refill on her  RX tramadol  And [id number] [place] [facility] [place] and pt can be reached at [phone] (W)

## 2016-03-19 NOTE — Telephone Encounter (Signed)
Spoke with patient and she said that at her last visit you had discussed taking over writing this rx for her as she is no longer under the care of Dr.Yates, and has not seen him for some years. Her rx last fill date was 09/27/15 of tramadol 50mg  #40, take one q 6-8hrs as needed for pain.

## 2016-03-19 NOTE — Telephone Encounter (Signed)
[place] for #40, no refill

## 2016-03-19 NOTE — Telephone Encounter (Signed)
We have never prescribed tramadol for her--she has gotten from Dr. Lorin Mercy.  Is she still seeing him for back pain? I would need to know quantity and date of last fill before prescribing since we don't have records from Dr. Lorin Mercy.  If she is still under his care, she should contact his office for refill.

## 2016-06-07 DIAGNOSIS — M531 Cervicobrachial syndrome: Secondary | ICD-10-CM | POA: Diagnosis not present

## 2016-06-07 DIAGNOSIS — M9903 Segmental and somatic dysfunction of lumbar region: Secondary | ICD-10-CM | POA: Diagnosis not present

## 2016-06-07 DIAGNOSIS — M9902 Segmental and somatic dysfunction of thoracic region: Secondary | ICD-10-CM | POA: Diagnosis not present

## 2016-06-07 DIAGNOSIS — M9901 Segmental and somatic dysfunction of cervical region: Secondary | ICD-10-CM | POA: Diagnosis not present

## 2016-06-11 DIAGNOSIS — M9901 Segmental and somatic dysfunction of cervical region: Secondary | ICD-10-CM | POA: Diagnosis not present

## 2016-06-11 DIAGNOSIS — M531 Cervicobrachial syndrome: Secondary | ICD-10-CM | POA: Diagnosis not present

## 2016-06-11 DIAGNOSIS — M9902 Segmental and somatic dysfunction of thoracic region: Secondary | ICD-10-CM | POA: Diagnosis not present

## 2016-06-11 DIAGNOSIS — M9903 Segmental and somatic dysfunction of lumbar region: Secondary | ICD-10-CM | POA: Diagnosis not present

## 2016-06-29 DIAGNOSIS — H04123 Dry eye syndrome of bilateral lacrimal glands: Secondary | ICD-10-CM | POA: Diagnosis not present

## 2016-07-04 DIAGNOSIS — Z6824 Body mass index (BMI) 24.0-24.9, adult: Secondary | ICD-10-CM | POA: Diagnosis not present

## 2016-07-04 DIAGNOSIS — Z1231 Encounter for screening mammogram for malignant neoplasm of breast: Secondary | ICD-10-CM | POA: Diagnosis not present

## 2016-07-04 DIAGNOSIS — Z01419 Encounter for gynecological examination (general) (routine) without abnormal findings: Secondary | ICD-10-CM | POA: Diagnosis not present

## 2016-08-01 DIAGNOSIS — Z6825 Body mass index (BMI) 25.0-25.9, adult: Secondary | ICD-10-CM | POA: Diagnosis not present

## 2016-08-01 DIAGNOSIS — N951 Menopausal and female climacteric states: Secondary | ICD-10-CM | POA: Diagnosis not present

## 2016-08-01 DIAGNOSIS — N950 Postmenopausal bleeding: Secondary | ICD-10-CM | POA: Diagnosis not present

## 2016-08-09 ENCOUNTER — Telehealth: Payer: Self-pay | Admitting: Family Medicine

## 2016-08-10 NOTE — Telephone Encounter (Signed)
Is this okay to refill? Last visit march 2017

## 2016-08-10 NOTE — Telephone Encounter (Signed)
Please advise her of refill policy (amount of notice needed for refills). I sent in gabapentin.  Okay to refill tramadol #40, please call in

## 2016-08-10 NOTE — Telephone Encounter (Signed)
Called in med to pharmacy. And left message for pt to call me back so I can tell her policy rules.  NOTE TO PATIENT- can not wait to the last minute needing refill and if provider is not here, you may have to wait til they return for refill

## 2016-08-10 NOTE — Telephone Encounter (Signed)
Pt states she needs refills on Tramadol & Gabapentin & she will run out of her Gabapentin today, she states Dr. Tomi Bamberger only asks her to come in her yearly for appointments and would like her Gabapentin for 90 days if possible to [facility]

## 2016-08-13 NOTE — Telephone Encounter (Signed)
Her reason for waiting until last minute is noted. Will rx for a year when she comes for her physical

## 2016-08-13 NOTE — Telephone Encounter (Signed)
Spoke to patient and she wants to know why her gabapentin wasn't refilled for a year when she was here for cpe, pt states she was told she didn't have to be seen again until a year and she thought her med would be refill for a year and wasn't aware that she didn't have a refill on it until she called in the other day for

## 2016-10-24 ENCOUNTER — Ambulatory Visit (INDEPENDENT_AMBULATORY_CARE_PROVIDER_SITE_OTHER): Payer: BLUE CROSS/BLUE SHIELD | Admitting: Physician Assistant

## 2016-10-24 ENCOUNTER — Ambulatory Visit (INDEPENDENT_AMBULATORY_CARE_PROVIDER_SITE_OTHER): Payer: BLUE CROSS/BLUE SHIELD

## 2016-10-24 DIAGNOSIS — M25521 Pain in right elbow: Secondary | ICD-10-CM

## 2016-10-24 DIAGNOSIS — M7711 Lateral epicondylitis, right elbow: Secondary | ICD-10-CM | POA: Diagnosis not present

## 2016-10-24 MED ORDER — DICLOFENAC SODIUM 2 % TD SOLN: 1.0000 | Freq: Four times a day (QID) | TRANSDERMAL | 3 refills | Status: DC

## 2016-10-24 NOTE — Progress Notes (Signed)
Office Visit Note   Patient: Barbara Oconnor           Date of Birth: April 11, 1966           MRN: [id number] Visit Date: 10/24/2016              Requested by: Barbara Ohara, MD [address] [place], [place] [address] PCP: Barbara Ports, MD   Assessment & Plan: Visit Diagnoses:  1. Pain in right elbow   2. Lateral epicondylitis, right elbow     Plan: I have her use NSAID to the elbow as she does not tolerate oral NSAIDs well.. Center physical therapy for stretching exercises elbow and also modalities. She did ask about possible back exercises that she's been seen by Dr. Lorin Oconnor past for low back and had epidural steroid injections was having therapy can help with some of her chronic back pain. Therefore will send her for stretching and core strengthening and a home exercise program for her back also.  Follow-Up Instructions: Return in about 4 weeks (around 11/21/2016).   Orders:  Orders Placed This Encounter  Procedures  . XR Elbow 2 Views Right   Meds ordered this encounter  Medications  . Diclofenac Sodium (PENNSAID) 2 % SOLN    Sig: Place 1 Tube onto the skin 4 (four) times daily.    Dispense:  100 g    Refill:  3      Procedures: No procedures performed   Clinical Data: No additional findings.   Subjective: Chief Complaint  Patient presents with  . Right Elbow - Pain    HPI Barbara Oconnor comes in today with elbow pain for the past 2 months no particular injury she was on a 14 hour flight July to [place]  and had her elbow resting on a seat. She has pain over the lateral aspect of her elbow down to her hand. No real numbness tingling. Has pain when picking up a coffee cup milk jug. States that she cannot tolerate NSAIDs orally. She also relates that she had a reaction to a cortisone injection in her elbow and has some years ago.. She has chronic low back pain is been seen by Dr. Lorin Oconnor in the past reports from therapy exercises for him back if possible. Review of  Systems   Objective: Vital Signs: There were no vitals taken for this visit.  Physical Exam  Constitutional: She is oriented to person, place, and time. She appears well-developed and well-nourished. No distress.  Pulmonary/Chest: Effort normal.  Neurological: She is alert and oriented to person, place, and time.  Psychiatric: She has a normal mood and affect.    Ortho Exam Right elbow good range of motion without pain. Volar flexion of the wrist against resistance causes pain lateral elbow. She has tenderness over the right lateral elbow. No rashes skin lesions ulcerations erythema about the elbow. She has full supination pronation hand. Radial pulse is 2+. Sensation grossly intact throughout the hand to light touch. Specialty Comments:  No specialty comments available.  Imaging: Xr Elbow 2 Views Right  Result Date: 10/24/2016 AP and lateral views of the right elbow shows the elbow to be well located. No acute fractures no bony abnormalities.  Ebow joint is well maintained.    PMFS History: There are no active problems to display for this patient.  Past Medical History:  Diagnosis Date  . Complication of anesthesia    slow awakening  . Goiter 2002   u/s 2002, 2010 (Dr. Buddy Oconnor)  .  H/O motion sickness   . Low back pain 10/2010  . Multiple thyroid nodules   . Rectal bleeding    anal fissure    Family History  Problem Relation Age of Onset  . Cancer Mother 6    breast cancer  . Hyperlipidemia Father   . Hypertension Father   . Bipolar disorder Sister   . Cancer Maternal Aunt     thyroid cancer  . Cancer Paternal Uncle     lymphatic  . Diabetes Paternal Grandmother   . Heart disease Paternal Grandfather   . Migraines Daughter     Past Surgical History:  Procedure Laterality Date  . COLONOSCOPY  03/2010   Jule Ser); benign polyp, told to repeat 10 years  . EXAMINATION UNDER ANESTHESIA N/A 11/04/2014   Procedure: EXAM UNDER ANESTHESIA;  Surgeon: Barbara Ruff,  MD;  Location: [facility];  Service: General;  Laterality: N/A;  . hemorrhoid banding    . SPHINCTEROTOMY N/A 11/04/2014   Procedure: CHEMICAL SPHINCTEROTOMY WITH BOTOX/ fisurectomy;  Surgeon: Barbara Ruff, MD;  Location: [facility];  Service: General;  Laterality: N/A;  . TEAR DUCT PROBING  2006   left  . TONSILLECTOMY  1986   Social History   Occupational History  . 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 Smoking status: Never Smoker  . Smokeless tobacco: Never Used  . Alcohol use Yes     Comment: occasional  . Drug use: No  . Sexual activity: Yes    Partners: Male    Birth control/ protection: Surgical     Comment: husband had vasectomy

## 2016-10-25 ENCOUNTER — Ambulatory Visit (INDEPENDENT_AMBULATORY_CARE_PROVIDER_SITE_OTHER): Payer: Managed Care, Other (non HMO) | Admitting: Orthopaedic Surgery

## 2016-10-29 ENCOUNTER — Telehealth (INDEPENDENT_AMBULATORY_CARE_PROVIDER_SITE_OTHER): Payer: Self-pay | Admitting: Physician Assistant

## 2016-10-29 NOTE — Telephone Encounter (Signed)
Patient called requesting her Diclofenac gel to be refilled and also she wanted to know if she could have her PT changed from the [place] location to the [place] location.  [phone].  Thank you.

## 2016-10-29 NOTE — Telephone Encounter (Signed)
Ok to refill voltaren gel and change PT

## 2016-10-29 NOTE — Telephone Encounter (Signed)
Please see message concerning a Rx refill for Diclofenac gel and change of location for Physical Therapy.  Please Advise.  Thank you.

## 2016-10-30 ENCOUNTER — Encounter (INDEPENDENT_AMBULATORY_CARE_PROVIDER_SITE_OTHER): Payer: Self-pay | Admitting: Physician Assistant

## 2016-10-30 ENCOUNTER — Other Ambulatory Visit (INDEPENDENT_AMBULATORY_CARE_PROVIDER_SITE_OTHER): Payer: Self-pay

## 2016-10-30 MED ORDER — DICLOFENAC SODIUM 2 % TD SOLN: 1.0000 | Freq: Four times a day (QID) | TRANSDERMAL | 3 refills | Status: DC

## 2016-10-30 NOTE — Telephone Encounter (Signed)
Called and left voicemail advising patient that Rx refill for Voltaren gel was sent to her pharmacy and she can change her location for PT.

## 2016-10-31 ENCOUNTER — Ambulatory Visit: Payer: BLUE CROSS/BLUE SHIELD | Attending: Physician Assistant

## 2016-10-31 DIAGNOSIS — G8929 Other chronic pain: Secondary | ICD-10-CM | POA: Insufficient documentation

## 2016-10-31 DIAGNOSIS — M25521 Pain in right elbow: Secondary | ICD-10-CM | POA: Diagnosis not present

## 2016-10-31 DIAGNOSIS — M545 Low back pain: Secondary | ICD-10-CM | POA: Insufficient documentation

## 2016-10-31 NOTE — Patient Instructions (Addendum)
Perform all exercises below:  Hold _20___ seconds. Repeat _3___ times.  Do __3__ sessions per day. CAUTION: Movement should be gentle, steady and slow.  Knee to Chest  Lying supine, bend involved knee to chest. Perform with each leg.  Copyright  VHI. All rights reserved.  Double Knee to Chest (Flexion)   Gently pull both knees toward chest. Feel stretch in lower back or buttock area. Breathing deeply, Lumbar Rotation: Caudal - Bilateral (Supine)  Feet and knees together, arms outstretched, rotate knees left, turning head in opposite direction, until stretch is felt.      HIP: Hamstrings - Short Sitting   Rest leg on raised surface. Keep knee straight. Lift chest.   AROM: Elbow Flexion / Extension   With left hand palm up, gently bend elbow as far as possible. Then straighten arm as far as possible. Repeat _10___ times per set. Do ____ sets per session. Do __3__ sessions per day.  Copyright  VHI. All rights reserved.  Wrist Flexor Stretch   Keeping elbow straight, grasp left hand and slowly bend wrist back until stretch is felt. Hold __10-20__ seconds. Relax. Repeat _3___ times per set. Do ____ sets per session. Do __3__ sessions per day.  Copyright  VHI. All rights reserved.  Wrist Extensor Stretch   Keeping elbow straight, grasp left hand and slowly bend wrist forward until stretch is felt. Hold __10-20__ seconds. Relax. Repeat __3__ times per set. Do ____ sets per session. Do __3__ sessions per day.  Copyright  VHI. All rights reserved.  [facility] [address] [place], [place] [address] Phone # [phone] Fax [phone]

## 2016-10-31 NOTE — Therapy (Signed)
[facility] [address], [place], [place], [address] Phone: [phone]   Fax:  [phone]  Physical Therapy Evaluation  Patient Details  Name: Barbara Oconnor MRN: [id number] Date of Birth: 15-Oct-1965 Referring Provider: Erskine Emery, [facility]  Encounter Date: 10/31/2016      PT End of Session - 10/31/16 1541    Visit Number 1   Number of Visits 10   Date for PT Re-Evaluation 12/26/16   PT Start Time [id number]   PT Stop Time 1532   PT Time Calculation (min) 47 min   Activity Tolerance Patient tolerated treatment well   Behavior During Therapy [facility] for tasks assessed/performed      Past Medical History:  Diagnosis Date  . Complication of anesthesia    slow awakening  . Goiter 2002   u/s 2002, 2010 (Dr. Buddy Duty)  . H/O motion sickness   . Low back pain 10/2010  . Multiple thyroid nodules   . Rectal bleeding    anal fissure    Past Surgical History:  Procedure Laterality Date  . COLONOSCOPY  03/2010   Jule Ser); benign polyp, told to repeat 10 years  . EXAMINATION UNDER ANESTHESIA N/A 11/04/2014   Procedure: EXAM UNDER ANESTHESIA;  Surgeon: Leighton Ruff, MD;  Location: [facility];  Service: General;  Laterality: N/A;  . hemorrhoid banding    . SPHINCTEROTOMY N/A 11/04/2014   Procedure: CHEMICAL SPHINCTEROTOMY WITH BOTOX/ fisurectomy;  Surgeon: Leighton Ruff, MD;  Location: [place];  Service: General;  Laterality: N/A;  . TEAR DUCT PROBING  2006   left  . TONSILLECTOMY  1986    There were no vitals filed for this visit.       Subjective Assessment - 10/31/16 1455    Subjective Pt presents to PT with complaints of 6-7 month history of Rt elbow pain and chronic LBP.  Pt reports that elbow started hurting after a long plane ride when she sat for a long period with elbows bent.     Pertinent History Pt is a Scientist, product/process development   Limitations Sitting;Standing;Walking   How  long can you sit comfortably? pain with sitting > 1 hour    How long can you stand comfortably? too long is painful 10 minutes    How long can you walk comfortably? sometimes painful   Diagnostic tests x-ray: Rt elbow was negaitve, MRI of low back: 3 years ago.  L4-5 Lt disc protrusion.   Patient Stated Goals reduce Rt elbow pain, reduce LBP, know what exercises she can do   Currently in Pain? Yes   Pain Score 2    Pain Location Back   Pain Orientation Right;Left;Lower   Pain Descriptors / Indicators Burning;Sore;Radiating   Pain Type Chronic pain   Pain Radiating Towards Lt LE    Pain Onset More than a month ago   Pain Frequency Intermittent   Aggravating Factors  sitting too long, standing, certain foods   Pain Relieving Factors change of position, medication, hot showers   Multiple Pain Sites Yes   Pain Score 7   Pain Location Elbow   Pain Orientation Right   Pain Descriptors / Indicators Stabbing;Burning   Pain Type Chronic pain   Pain Onset More than a month ago   Pain Frequency Intermittent   Aggravating Factors  use of Rt UE   Pain Relieving Factors not moving Rt UE            [facility] PT Assessment -  10/31/16 0001      Assessment   Medical Diagnosis Rt elbow lateral epicondylitis, chronic low back pain   Referring Provider Erskine Emery, PAC   Onset Date/Surgical Date 03/30/16  03/2016 elbow, back chronic   Hand Dominance Right     Precautions   Precautions None     Restrictions   Weight Bearing Restrictions No     Balance Screen   Has the patient fallen in the past 6 months No   Has the patient had a decrease in activity level because of a fear of falling?  No   Is the patient reluctant to leave their home because of a fear of falling?  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  Rt elbow and wrist A/ROM is full..  Pt reported Rt lateral epicondyle pain at end range elbow flexion.       PROM   Overall PROM  Deficits   Overall PROM Comments hip flexibility limited by 25% bil.      Strength   Overall Strength Within functional limits for tasks performed   Overall Strength Comments 4+/5 bil. LE strength,  Rt elbow and wrist strength 5/5 with pain reported with resisted elbow flexion and extension.       Palpation   Spinal mobility reduce PA spinal mobility L1-5 with pain reported.     Palpation comment tension/trigger points in bil lumbar paraspinals, bil. gluteals.  Marked palpable tenderness over Rt lateral epicondyle and proximal wrist extensor tendons.       Special Tests    Special Tests Lumbar   Lumbar Tests Slump Test     Slump test   Findings Negative   Side Left     Transfers   Transfers Independent with all Transfers                           PT Education - 10/31/16 1523    Education provided Yes   Education Details elbow flexiblity, lumbar flexibility   Person(s) Educated Patient   Methods Explanation;Demonstration;Handout   Comprehension Verbalized understanding;Returned demonstration          PT Short Term Goals - 10/31/16 1551      PT SHORT TERM GOAL #1   Title be independent in initial HEP   Time 4   Period Weeks   Status New     PT SHORT TERM GOAL #2   Title report < or = to 5/10 Rt  elbow pain with use   Time 4   Period Weeks   Status New     PT SHORT TERM GOAL #3   Title report a 30% reduction in the  frequency and intensity of Rt elbow pain with ADLs and work tasks   Time 4   Period Weeks   Status New     PT SHORT TERM GOAL #4   Title stand/walk for 20 minutes before LBP increases   Time 4   Period Weeks   Status New           PT Long Term Goals - 10/31/16 1447      PT LONG TERM GOAL #1   Title be independent in advanced HEP   Time 8   Period Weeks   Status New     PT LONG TERM GOAL #2   Title reduce FOTO to < or = to 36% limitation   Time 8   Period Weeks   Status New     PT LONG TERM GOAL #3   Title report a 70% reduction in Rt elbow pain with home and work tasks   Time 8   Period Weeks   Status New     PT LONG TERM GOAL #4   Title report normal use of Rt UE with daily tasks without limitation due to pain   Time 8   Period Weeks   Status New     PT LONG TERM GOAL #5   Title demonstrate and verbalize correct body mechanics to protect lumbar spine with ADLs and self-care   Time 8   Period Weeks   Status New     Additional Long Term Goals   Additional Long Term Goals Yes     PT LONG TERM GOAL #6   Title stand/walk for > or = to 30 minutes without increase in LBP   Time 8   Period Weeks   Status New               Plan - 10/31/16 1542    Clinical Impression Statement Pt presents to PT with complaints of chronic LBP and 6-7 month history of Rt elbow pain.  Pt reports 7/10 Rt elbow pain and 2-7/10 LBP.  Pt with significant LBP with standing > 10 minutes and has been significantly limiting Rt elbow use due to pain.  Pt demonstrates painful Rt elbow A/ROM and marked palpable tenderness at lateral epicondyle and proximal wrist extensor tendons.  Pt with trigger points in lumbar paraspinals and gluteals.  Pt will benefit from skilled PT for dry needling to Rt elbow and lumbar spine, core strength, manual and modalities for pain.      Rehab Potential Good   PT Frequency 2x / week   PT Duration 8 weeks   PT Treatment/Interventions ADLs/Self Care Home Management;Electrical Stimulation;Iontophoresis 4mg /ml Dexamethasone;Functional mobility training;Ultrasound;Moist Heat;Therapeutic activities;Therapeutic exercise;Neuromuscular re-education;Patient/family education;Passive range of motion;Manual techniques;Dry needling;Taping   PT Next Visit Plan Dry needling to Rt wrist extensor tendons and lumbar multifidi/gluteals, ionto to Rt elbow if order signed and pt agrees, manual to Rt forearm, stretching.    Consulted and Agree with Plan of Care Patient      Patient will benefit from skilled therapeutic intervention in order to improve the following deficits and impairments:  Pain, Postural dysfunction, Impaired flexibility, Increased muscle spasms, Decreased activity tolerance, Difficulty walking  Visit Diagnosis: Pain in right elbow - Plan: PT plan of care cert/re-cert  Chronic bilateral low back pain without sciatica - Plan: PT plan of care cert/re-cert     Problem List There are no active problems to display for this patient.   Sigurd Sos, PT 10/31/16 3:58 PM  [facility] [address], [place], [place], [address] Phone: [phone]   Fax:  [phone]  Name: Barbara Oconnor MRN: [id number] Date of Birth: April 21, 1966

## 2016-11-01 ENCOUNTER — Other Ambulatory Visit (INDEPENDENT_AMBULATORY_CARE_PROVIDER_SITE_OTHER): Payer: Self-pay

## 2016-11-01 ENCOUNTER — Telehealth (INDEPENDENT_AMBULATORY_CARE_PROVIDER_SITE_OTHER): Payer: Self-pay | Admitting: Orthopaedic Surgery

## 2016-11-01 MED ORDER — DICLOFENAC SODIUM 2 % TD SOLN: 1.0000 | Freq: Four times a day (QID) | TRANSDERMAL | 3 refills | Status: DC

## 2016-11-01 NOTE — Telephone Encounter (Signed)
Christina from [facility] called and stated that she had to refuse the prescription for Pennsaid for patient Barbara Oconnor because it was in the quantity of 100g which it needs to be 112g.  She requested a new prescription sent with that quantity to [id number].  Cb#[phone].  Thank you.

## 2016-11-02 ENCOUNTER — Ambulatory Visit: Payer: BLUE CROSS/BLUE SHIELD | Admitting: Physical Therapy

## 2016-11-02 DIAGNOSIS — M545 Low back pain, unspecified: Secondary | ICD-10-CM

## 2016-11-02 DIAGNOSIS — M25521 Pain in right elbow: Secondary | ICD-10-CM | POA: Diagnosis not present

## 2016-11-02 DIAGNOSIS — G8929 Other chronic pain: Secondary | ICD-10-CM | POA: Diagnosis not present

## 2016-11-02 NOTE — Telephone Encounter (Signed)
Changed Rx to BID

## 2016-11-02 NOTE — Patient Instructions (Signed)
     Trigger Point Dry Needling  . What is Trigger Point Dry Needling (DN)? o DN is a physical therapy technique used to treat muscle pain and dysfunction. Specifically, DN helps deactivate muscle trigger points (muscle knots).  o A thin filiform needle is used to penetrate the skin and stimulate the underlying trigger point. The goal is for a local twitch response (LTR) to occur and for the trigger point to relax. No medication of any kind is injected during the procedure.   . What Does Trigger Point Dry Needling Feel Like?  o The procedure feels different for each individual patient. Some patients report that they do not actually feel the needle enter the skin and overall the process is not painful. Very mild bleeding may occur. However, many patients feel a deep cramping in the muscle in which the needle was inserted. This is the local twitch response.   . How Will I feel after the treatment? o Soreness is normal, and the onset of soreness may not occur for a few hours. Typically this soreness does not last longer than two days.  o Bruising is uncommon, however; ice can be used to decrease any possible bruising.  o In rare cases feeling tired or nauseous after the treatment is normal. In addition, your symptoms may get worse before they get better, this period will typically not last longer than 24 hours.   . What Can I do After My Treatment? o Increase your hydration by drinking more water for the next 24 hours. o You may place ice or heat on the areas treated that have become sore, however, do not use heat on inflamed or bruised areas. Heat often brings more relief post needling. o You can continue your regular activities, but vigorous activity is not recommended initially after the treatment for 24 hours. o DN is best combined with other physical therapy such as strengthening, stretching, and other therapies.    Infantof Villagomez PT [facility] [address], [address] [place], [place] [address] Phone # [phone] Fax [phone] 

## 2016-11-02 NOTE — Therapy (Signed)
[facility] [address], [place], [place], [address] Phone: [phone]   Fax:  [phone]  Physical Therapy Treatment  Patient Details  Name: Barbara Oconnor MRN: [id number] Date of Birth: [birth year] Referring Provider: Erskine Emery, [facility]  Encounter Date: 11/02/2016      PT End of Session - 11/02/16 1009    Visit Number 2   Number of Visits 10   Date for PT Re-Evaluation 12/26/16   PT Start Time 0927   PT Stop Time 1014   PT Time Calculation (min) 47 min   Activity Tolerance Patient tolerated treatment well      Past Medical History:  Diagnosis Date  . Complication of anesthesia    slow awakening  . Goiter 2002   u/s 2002, 2010 (Dr. Buddy Duty)  . H/O motion sickness   . Low back pain 10/2010  . Multiple thyroid nodules   . Rectal bleeding    anal fissure    Past Surgical History:  Procedure Laterality Date  . COLONOSCOPY  03/2010   Jule Ser); benign polyp, told to repeat 10 years  . EXAMINATION UNDER ANESTHESIA N/A 11/04/2014   Procedure: EXAM UNDER ANESTHESIA;  Surgeon: Leighton Ruff, MD;  Location: [facility];  Service: General;  Laterality: N/A;  . hemorrhoid banding    . SPHINCTEROTOMY N/A 11/04/2014   Procedure: CHEMICAL SPHINCTEROTOMY WITH BOTOX/ fisurectomy;  Surgeon: Leighton Ruff, MD;  Location: [place];  Service: General;  Laterality: N/A;  . TEAR DUCT PROBING  2006   left  . TONSILLECTOMY  1986    There were no vitals filed for this visit.      Subjective Assessment - 11/02/16 0925    Subjective The elbow pain is more recent.  LBP is chronic.  Wearng tennis elbow brace but patient    Currently in Pain? Yes   Pain Score 4    Pain Location Elbow   Pain Orientation Right   Multiple Pain Sites Yes   Pain Score 3   Pain Location Back   Pain Orientation Right;Left   Pain Type Chronic pain                         [facility] Adult PT  Treatment/Exercise - 11/02/16 0001      Elbow Exercises   Other elbow exercises review of elbow stretches from eval   Other elbow exercises wrist extension with bent and straight elbow with mobilization 2x5;  gripping towel with mobilization 5x     Lumbar Exercises: Stretches   Active Hamstring Stretch 3 reps;20 seconds   Active Hamstring Stretch Limitations seated with heel on floor     Moist Heat Therapy   Number Minutes Moist Heat 10 Minutes   Moist Heat Location Elbow     Manual Therapy   Manual Therapy Joint mobilization;Soft tissue mobilization   Joint Mobilization lateral glide elbow mobilization with movement    Soft tissue mobilization right forearm extensor wad           Trigger Point Dry Needling - 11/02/16 1225    Consent Given? Yes   Education Handout Provided Yes   Muscles Treated Upper Body --  right extensor carpi radialis brevis and supinator              PT Education - 11/02/16 1008    Education provided Yes   Education Details dry needling after care;  self care for home including appropriate  placement and wear time of elbow brace;  compliance with HEP;  use of modalities   Person(s) Educated Patient   Methods Explanation;Handout   Comprehension Verbalized understanding          PT Short Term Goals - 11/02/16 1231      PT SHORT TERM GOAL #1   Title be independent in initial HEP   Time 4   Period Weeks   Status On-going     PT SHORT TERM GOAL #2   Title report < or = to 5/10 Rt elbow pain with use   Time 4   Period Weeks   Status On-going     PT SHORT TERM GOAL #3   Title report a 30% reduction in the frequency and intensity of Rt elbow pain with ADLs and work tasks   Time 4   Period Weeks   Status On-going     PT SHORT TERM GOAL #4   Title stand/walk for 20 minutes before LBP increases   Time 4   Period Weeks   Status On-going           PT Long Term Goals - 11/02/16 1231      PT LONG TERM GOAL #1   Title be  independent in advanced HEP   Time 8   Period Weeks   Status On-going     PT LONG TERM GOAL #2   Title reduce FOTO to < or = to 36% limitation   Time 8   Period Weeks   Status On-going     PT LONG TERM GOAL #3   Title report a 70% reduction in Rt elbow pain with home and work tasks   Time 8   Period Weeks   Status On-going     PT LONG TERM GOAL #4   Title report normal use of Rt UE with daily tasks without limitation due to pain   Time 8   Period Weeks   Status On-going     PT LONG TERM GOAL #5   Title demonstrate and verbalize correct body mechanics to protect lumbar spine with ADLs and self-care   Time 8   Period Weeks   Status On-going     PT LONG TERM GOAL #6   Title stand/walk for > or = to 30 minutes without increase in LBP   Time 8   Period Weeks   Status On-going               Plan - 11/02/16 1226    Clinical Impression Statement The patient reports compliance with wearing elbow brace and is wearing in proper place.   She is able to demonstrate her previously given HEP with 1-2 cues needed for seated HS stretch.  Her elbow pain is worse than back pain today and therefore treatment focused on this area.  Tender points identified in extensor carpi radialis brevis, longus and supinator.   Point tender over lateral epicondyle.  Following treatement session improved soft tissue mobility noted.  Therapist closely monitoring response with all treatment interventions.     PT Next Visit Plan  assess response to Dry needling to Rt wrist extensor tendons #1 and add lumbar multifidi/gluteals, ionto to Rt elbow if order signed and pt agrees, manual to Rt forearm, stretching.       Patient will benefit from skilled therapeutic intervention in order to improve the following deficits and impairments:     Visit Diagnosis: Pain in right elbow  Chronic bilateral low back  pain without sciatica     Problem List There are no active problems to display for this  patient.  [name], PT 11/02/16 12:34 PM Phone: [phone] Fax: [phone]  [name] 11/02/2016, 12:34 PM   [facility] [address], [place], [place], [address] Phone: [phone]   Fax:  [phone]  Name: Barbara Oconnor MRN: [id number] Date of Birth: 04-28-1966

## 2016-11-02 NOTE — Telephone Encounter (Signed)
[facility] called back needing clarification on RX [phone] [id number]

## 2016-11-02 NOTE — Telephone Encounter (Signed)
Faxed

## 2016-11-07 ENCOUNTER — Ambulatory Visit: Payer: BLUE CROSS/BLUE SHIELD | Admitting: Physical Therapy

## 2016-11-07 ENCOUNTER — Encounter: Payer: Self-pay | Admitting: Physical Therapy

## 2016-11-07 DIAGNOSIS — M25521 Pain in right elbow: Secondary | ICD-10-CM

## 2016-11-07 DIAGNOSIS — M545 Low back pain, unspecified: Secondary | ICD-10-CM

## 2016-11-07 DIAGNOSIS — G8929 Other chronic pain: Secondary | ICD-10-CM | POA: Diagnosis not present

## 2016-11-07 NOTE — Therapy (Signed)
[facility] [address], [place], [place], [address] Phone: [phone]   Fax:  [phone]  Physical Therapy Treatment  Patient Details  Name: Barbara Oconnor MRN: [id number] Date of Birth: 06-16-1966 Referring Provider: Erskine Emery, [facility]  Encounter Date: 11/07/2016      PT End of Session - 11/07/16 1359    Visit Number 3   Number of Visits 10   Date for PT Re-Evaluation 12/26/16   PT Start Time [id number]   PT Stop Time 1445   PT Time Calculation (min) 46 min   Activity Tolerance Patient tolerated treatment well   Behavior During Therapy [facility] for tasks assessed/performed      Past Medical History:  Diagnosis Date  . Complication of anesthesia    slow awakening  . Goiter 2002   u/s 2002, 2010 (Dr. Buddy Duty)  . H/O motion sickness   . Low back pain 10/2010  . Multiple thyroid nodules   . Rectal bleeding    anal fissure    Past Surgical History:  Procedure Laterality Date  . COLONOSCOPY  03/2010   Jule Ser); benign polyp, told to repeat 10 years  . EXAMINATION UNDER ANESTHESIA N/A 11/04/2014   Procedure: EXAM UNDER ANESTHESIA;  Surgeon: Leighton Ruff, MD;  Location: [facility];  Service: General;  Laterality: N/A;  . hemorrhoid banding    . SPHINCTEROTOMY N/A 11/04/2014   Procedure: CHEMICAL SPHINCTEROTOMY WITH BOTOX/ fisurectomy;  Surgeon: Leighton Ruff, MD;  Location: [place];  Service: General;  Laterality: N/A;  . TEAR DUCT PROBING  2006   left  . TONSILLECTOMY  1986    There were no vitals filed for this visit.      Subjective Assessment - 11/07/16 1404    Subjective Back pain is "typical" has been sitting for a couple hours and sitting makes it worse, moving makes it better.  Denies elbow pain at the moment.   Currently in Pain? Yes   Pain Score 2    Pain Location Back   Aggravating Factors  sitting too long   Pain Relieving Factors moving   Effect of Pain  on Daily Activities doing pilates (work)   Multiple Pain Sites No                      Pelvic Floor Special Questions - 11/07/16 0001    Urinary urgency Yes           [facility] Adult PT Treatment/Exercise - 11/07/16 0001      Self-Care   Self-Care Other Self-Care Comments   Other Self-Care Comments  discussed urge to void and techniques to decrease urge to void during manual treatment     Neuro Re-ed    Neuro Re-ed Details  hip adduction without glute activation - reviewed ball squeezes and hip adduction due to adductor weakness and patient overusing glutes during glute activation     Exercises   Exercises --  tennis ball self massage to glutes     Elbow Exercises   Other elbow exercises review of elbow stretches from eval   Other elbow exercises wrist extension with bent and straight elbow with mobilization 2x5;  gripping towel with mobilization 5x     Manual Therapy   Manual Therapy Soft tissue mobilization;Muscle Energy Technique   Manual therapy comments prone with pillows to support hips   Soft tissue mobilization bilateral glutes and lumbar paraspinals; Rt glutes>Lt glutes   Muscle Energy Technique  correcting Lt anterior pelvic rotation - hip extension against PT resistance, abduction, adduction 5x5 sec isometric holds                  PT Short Term Goals - 11/07/16 1556      PT SHORT TERM GOAL #1   Title be independent in initial HEP   Time 4   Period Weeks   Status On-going     PT SHORT TERM GOAL #2   Title report < or = to 5/10 Rt elbow pain with use   Time 4   Period Weeks   Status On-going           PT Long Term Goals - 11/07/16 1555      PT LONG TERM GOAL #2   Title reduce FOTO to < or = to 36% limitation   Time 8   Period Weeks   Status On-going     PT LONG TERM GOAL #5   Title demonstrate and verbalize correct body mechanics to protect lumbar spine with ADLs and self-care   Time 8   Period Weeks   Status On-going                Plan - 11/07/16 1548    Clinical Impression Statement Patient performing all stretches on her own.  Expresses wanting to do something in PT that she cannot do at home.  Pt had muscle spasm and trigger points in glute attachments along iliac crest, Lt pelvis anterior rotation, hip adductor weakness, and difficulty doing hip adduction without contracting glutes.  Pt appears to be overcompensating with glutes due to weakness in pelvic floor and hip adductors.  Skilled PT needed to work on [employer] and coordination.   Rehab Potential Good   PT Frequency 2x / week   PT Duration 8 weeks   PT Treatment/Interventions ADLs/Self Care Home Management;Electrical Stimulation;Iontophoresis 4mg /ml Dexamethasone;Functional mobility training;Ultrasound;Moist Heat;Therapeutic activities;Therapeutic exercise;Neuromuscular re-education;Patient/family education;Passive range of motion;Manual techniques;Dry needling;Taping   PT Next Visit Plan f/u on decreased urge to void with quick flicks and relaxation techniques given, assess for dry needling to wrist extensors, multifidi/gluteals, ionto to elbow if order signed, manual as needed   Consulted and Agree with Plan of Care Patient      Patient will benefit from skilled therapeutic intervention in order to improve the following deficits and impairments:  Pain, Postural dysfunction, Impaired flexibility, Increased muscle spasms, Decreased activity tolerance, Difficulty walking  Visit Diagnosis: Pain in right elbow  Chronic bilateral low back pain without sciatica     Problem List There are no active problems to display for this patient.   Zannie Cove, PT 11/07/2016, 4:01 PM  [facility] [address], [place], [place], [address] Phone: [phone]   Fax:  [phone]  Name: Barbara Oconnor MRN: [id number] Date of Birth: September 18, 1966

## 2016-11-09 ENCOUNTER — Ambulatory Visit: Payer: BLUE CROSS/BLUE SHIELD | Admitting: Physical Therapy

## 2016-11-09 DIAGNOSIS — G8929 Other chronic pain: Secondary | ICD-10-CM | POA: Diagnosis not present

## 2016-11-09 DIAGNOSIS — M545 Low back pain, unspecified: Secondary | ICD-10-CM

## 2016-11-09 DIAGNOSIS — M25521 Pain in right elbow: Secondary | ICD-10-CM

## 2016-11-09 NOTE — Therapy (Signed)
[facility] [address], [place], [place], [address] Phone: [phone]   Fax:  [phone]  Physical Therapy Treatment  Patient Details  Name: Barbara Oconnor MRN: [id number] Date of Birth: [birth year] Referring Provider: Erskine Emery, [facility]  Encounter Date: 11/09/2016      PT End of Session - 11/09/16 1013    Visit Number 4   Number of Visits 10   Date for PT Re-Evaluation 12/26/16   PT Start Time 0930   PT Stop Time [id number]   PT Time Calculation (min) 45 min   Activity Tolerance Patient tolerated treatment well      Past Medical History:  Diagnosis Date  . Complication of anesthesia    slow awakening  . Goiter 2002   u/s 2002, 2010 (Dr. Buddy Duty)  . H/O motion sickness   . Low back pain 10/2010  . Multiple thyroid nodules   . Rectal bleeding    anal fissure    Past Surgical History:  Procedure Laterality Date  . COLONOSCOPY  03/2010   Jule Ser); benign polyp, told to repeat 10 years  . EXAMINATION UNDER ANESTHESIA N/A 11/04/2014   Procedure: EXAM UNDER ANESTHESIA;  Surgeon: Leighton Ruff, MD;  Location: [facility];  Service: General;  Laterality: N/A;  . hemorrhoid banding    . SPHINCTEROTOMY N/A 11/04/2014   Procedure: CHEMICAL SPHINCTEROTOMY WITH BOTOX/ fisurectomy;  Surgeon: Leighton Ruff, MD;  Location: [place];  Service: General;  Laterality: N/A;  . TEAR DUCT PROBING  2006   left  . TONSILLECTOMY  1986    There were no vitals filed for this visit.      Subjective Assessment - 11/09/16 0933    Subjective Denies excessive soreness following dry needling previously performed.  A few mornings after the DN I woke up not sore.     Currently in Pain? Yes   Pain Score 3    Pain Location Elbow   Pain Orientation Right   Pain Type Chronic pain   Pain Score 3   Pain Location Back   Pain Orientation Right;Left   Pain Type Chronic pain                          [facility] Adult PT Treatment/Exercise - 11/09/16 0001      Therapeutic Activites    Therapeutic Activities ADL's   ADL's gluteal, piriformis ex for standing /walking     Moist Heat Therapy   Number Minutes Moist Heat 10 Minutes   Moist Heat Location Elbow;Lumbar Spine     Manual Therapy   Manual Therapy Myofascial release   Soft tissue mobilization right forearm extensor wad    Muscle Energy Technique bilateral lumbar paraspinals, gluteals and piriformis          Trigger Point Dry Needling - 11/09/16 1011    Consent Given? Yes   Muscles Treated Upper Body --  right wrist extensor wad, ECRB, supinator   Muscles Treated Lower Body Gluteus minimus;Gluteus maximus;Piriformis  bilateral lumbar multifidi   Gluteus Maximus Response Twitch response elicited;Palpable increased muscle length   Gluteus Minimus Response Twitch response elicited;Palpable increased muscle length   Piriformis Response Twitch response elicited;Palpable increased muscle length      Bilaterally.          PT Short Term Goals - 11/09/16 1221      PT SHORT TERM GOAL #1   Title be independent in initial HEP  Time 4   Period Weeks   Status On-going     PT SHORT TERM GOAL #2   Title report < or = to 5/10 Rt elbow pain with use   Time 4   Period Weeks   Status On-going     PT SHORT TERM GOAL #3   Title report a 30% reduction in the frequency and intensity of Rt elbow pain with ADLs and work tasks   Time 4   Status On-going     PT SHORT TERM GOAL #4   Title stand/walk for 20 minutes before LBP increases   Time 4   Period Weeks   Status On-going           PT Long Term Goals - 11/09/16 1222      PT LONG TERM GOAL #1   Title be independent in advanced HEP   Time 8   Period Weeks   Status On-going     PT LONG TERM GOAL #2   Title reduce FOTO to < or = to 36% limitation   Time 8   Period Weeks   Status On-going     PT LONG TERM GOAL #3   Title  report a 70% reduction in Rt elbow pain with home and work tasks   Time 8   Period Weeks   Status On-going     PT LONG TERM GOAL #4   Title report normal use of Rt UE with daily tasks without limitation due to pain   Time 8   Period Weeks   Status On-going     PT LONG TERM GOAL #5   Title demonstrate and verbalize correct body mechanics to protect lumbar spine with ADLs and self-care   Time 8   Period Weeks   Status On-going     PT LONG TERM GOAL #6   Title stand/walk for > or = to 30 minutes without increase in LBP   Time 8   Period Weeks   Status On-going               Plan - 11/09/16 1216    Clinical Impression Statement Decreased overall tender points in left forearm extensors.  Numerous tender points in bilateral gluteals and piriformis muscles.  Improved muscle length following dry needling and manual therapy.  Discussed lumbar and hip self stretches to complement treatment interventions.  Patient will think about ionto addition to treatment.     PT Next Visit Plan assess response to DN #1 to lumbar/gluteals, add QL?;  DN to right forearm;  patient considering ionto      Patient will benefit from skilled therapeutic intervention in order to improve the following deficits and impairments:     Visit Diagnosis: Pain in right elbow  Chronic bilateral low back pain without sciatica     Problem List There are no active problems to display for this patient.  [name], PT 11/09/16 12:24 PM Phone: [phone] Fax: [phone]  [name] 11/09/2016, 12:23 PM  [facility] [address] [place], [place], [address] Phone: [phone]   Fax:  [phone]  Name: KASEE MASUDA MRN: [id number] Date of Birth: 18-Jul-1966

## 2016-11-13 ENCOUNTER — Ambulatory Visit: Payer: BLUE CROSS/BLUE SHIELD

## 2016-11-13 DIAGNOSIS — G8929 Other chronic pain: Secondary | ICD-10-CM | POA: Diagnosis not present

## 2016-11-13 DIAGNOSIS — M25521 Pain in right elbow: Secondary | ICD-10-CM | POA: Diagnosis not present

## 2016-11-13 DIAGNOSIS — M545 Low back pain: Secondary | ICD-10-CM | POA: Diagnosis not present

## 2016-11-13 NOTE — Therapy (Signed)
[facility] [address] [place], [place], [address] Phone: [phone]   Fax:  [phone]  Physical Therapy Treatment  Patient Details  Name: Barbara Oconnor MRN: [id number] Date of Birth: [birth year] Referring Provider: Erskine Emery, [facility]  Encounter Date: 11/13/2016      PT End of Session - 11/13/16 1100    Visit Number 5   Date for PT Re-Evaluation 12/26/16   PT Start Time [id number]   PT Stop Time 1110   PT Time Calculation (min) 55 min   Activity Tolerance Patient tolerated treatment well   Behavior During Therapy [facility] for tasks assessed/performed      Past Medical History:  Diagnosis Date  . Complication of anesthesia    slow awakening  . Goiter 2002   u/s 2002, 2010 (Dr. Buddy Duty)  . H/O motion sickness   . Low back pain 10/2010  . Multiple thyroid nodules   . Rectal bleeding    anal fissure    Past Surgical History:  Procedure Laterality Date  . COLONOSCOPY  03/2010   Jule Ser); benign polyp, told to repeat 10 years  . EXAMINATION UNDER ANESTHESIA N/A 11/04/2014   Procedure: EXAM UNDER ANESTHESIA;  Surgeon: Leighton Ruff, MD;  Location: [facility];  Service: General;  Laterality: N/A;  . hemorrhoid banding    . SPHINCTEROTOMY N/A 11/04/2014   Procedure: CHEMICAL SPHINCTEROTOMY WITH BOTOX/ fisurectomy;  Surgeon: Leighton Ruff, MD;  Location: [place];  Service: General;  Laterality: N/A;  . TEAR DUCT PROBING  2006   left  . TONSILLECTOMY  1986    There were no vitals filed for this visit.      Subjective Assessment - 11/13/16 1018    Subjective Rt arm is feeling better after needling.  Not able to determine any change in low back.     Patient Stated Goals reduce Rt elbow pain, reduce LBP, know what exercises she can do   Currently in Pain? Yes   Pain Score 2    Pain Location Elbow   Pain Orientation Right   Pain Descriptors / Indicators Sore   Pain Type  Chronic pain   Pain Onset More than a month ago   Pain Frequency Intermittent   Aggravating Factors  use of arm, gripping with hands/fingers   Pain Relieving Factors rest, wearing   Pain Score 3   Pain Location Back   Pain Orientation Right;Left   Pain Descriptors / Indicators Stabbing;Burning   Pain Type Chronic pain   Pain Onset More than a month ago   Pain Frequency Intermittent   Aggravating Factors  sleep at night, sitting, standing   Pain Relieving Factors rest, stretching                         [facility] Adult PT Treatment/Exercise - 11/13/16 0001      Moist Heat Therapy   Number Minutes Moist Heat 12 Minutes   Moist Heat Location Elbow;Lumbar Spine     Manual Therapy   Manual Therapy Myofascial release   Soft tissue mobilization right forearm extensor wad    Muscle Energy Technique bilateral lumbar paraspinals, gluteals and piriformis          Trigger Point Dry Needling - 11/13/16 1022    Consent Given? Yes   Muscles Treated Upper Body --  wrist extensors, ECRB   Muscles Treated Lower Body Gluteus maximus;Gluteus minimus;Piriformis   Gluteus Maximus Response Twitch response  elicited;Palpable increased muscle length   Gluteus Minimus Response Palpable increased muscle length;Twitch response elicited   Piriformis Response Twitch response elicited;Palpable increased muscle length                PT Short Term Goals - 11/13/16 1021      PT SHORT TERM GOAL #1   Title be independent in initial HEP   Status Achieved     PT SHORT TERM GOAL #2   Title report < or = to 5/10 Rt elbow pain with use   Status Achieved     PT SHORT TERM GOAL #3   Title report a 30% reduction in the frequency and intensity of Rt elbow pain with ADLs and work tasks   Time 4   Period Weeks   Status On-going     PT SHORT TERM GOAL #4   Title stand/walk for 20 minutes before LBP increases   Time 4   Period Weeks   Status On-going           PT Long Term  Goals - 11/09/16 1222      PT LONG TERM GOAL #1   Title be independent in advanced HEP   Time 8   Period Weeks   Status On-going     PT LONG TERM GOAL #2   Title reduce FOTO to < or = to 36% limitation   Time 8   Period Weeks   Status On-going     PT LONG TERM GOAL #3   Title report a 70% reduction in Rt elbow pain with home and work tasks   Time 8   Period Weeks   Status On-going     PT LONG TERM GOAL #4   Title report normal use of Rt UE with daily tasks without limitation due to pain   Time 8   Period Weeks   Status On-going     PT LONG TERM GOAL #5   Title demonstrate and verbalize correct body mechanics to protect lumbar spine with ADLs and self-care   Time 8   Period Weeks   Status On-going     PT LONG TERM GOAL #6   Title stand/walk for > or = to 30 minutes without increase in LBP   Time 8   Period Weeks   Status On-going               Plan - 11/13/16 1058    Clinical Impression Statement Pt reports 30% reduction in Rt elbow pain overall.  Pt with trigger points in Rt wrist extensor tendons and lumbar spine/gluteals today.  Pt continues to stretch at home and will take 1 week off in between needling sessions.  Pt will continue to benefit from skilled PT for dry needling, flexibility, manual and strength.     Rehab Potential Good   PT Frequency 2x / week   PT Duration 8 weeks   PT Treatment/Interventions ADLs/Self Care Home Management;Electrical Stimulation;Iontophoresis 4mg /ml Dexamethasone;Functional mobility training;Ultrasound;Moist Heat;Therapeutic activities;Therapeutic exercise;Neuromuscular re-education;Patient/family education;Passive range of motion;Manual techniques;Dry needling;Taping   PT Next Visit Plan assess response to dry needling #2, flexibility and strength to elbow, lumbar and gluteals.     Consulted and Agree with Plan of Care Patient      Patient will benefit from skilled therapeutic intervention in order to improve the following  deficits and impairments:  Pain, Postural dysfunction, Impaired flexibility, Increased muscle spasms, Decreased activity tolerance, Difficulty walking  Visit Diagnosis: Pain in right elbow  Chronic bilateral  low back pain without sciatica     Problem List There are no active problems to display for this patient.    Sigurd Sos, PT 11/13/16 11:02 AM  [facility] [address], [place], [place], [address] Phone: [phone]   Fax:  [phone]  Name: SYMONNE CARREON MRN: [id number] Date of Birth: [birth year]

## 2016-11-20 ENCOUNTER — Ambulatory Visit: Payer: BLUE CROSS/BLUE SHIELD | Admitting: Physical Therapy

## 2016-11-20 DIAGNOSIS — G8929 Other chronic pain: Secondary | ICD-10-CM | POA: Diagnosis not present

## 2016-11-20 DIAGNOSIS — M25521 Pain in right elbow: Secondary | ICD-10-CM | POA: Diagnosis not present

## 2016-11-20 DIAGNOSIS — M545 Low back pain: Secondary | ICD-10-CM

## 2016-11-20 NOTE — Patient Instructions (Signed)

## 2016-11-20 NOTE — Therapy (Signed)
[facility] [address], [place], [place], [address] Phone: [phone]   Fax:  [phone]  Physical Therapy Treatment  Patient Details  Name: Barbara Oconnor MRN: [id number] Date of Birth: August 11, 1966 Referring Provider: Erskine Emery, [facility]  Encounter Date: 11/20/2016      PT End of Session - 11/20/16 [id number]    Visit Number 6   Number of Visits 10   Date for PT Re-Evaluation 12/26/16   Authorization Type 20 visit limit   PT Start Time 1402   PT Stop Time 1444   PT Time Calculation (min) 42 min   Activity Tolerance Patient tolerated treatment well      Past Medical History:  Diagnosis Date  . Complication of anesthesia    slow awakening  . Goiter 2002   u/s 2002, 2010 (Dr. Buddy Duty)  . H/O motion sickness   . Low back pain 10/2010  . Multiple thyroid nodules   . Rectal bleeding    anal fissure    Past Surgical History:  Procedure Laterality Date  . COLONOSCOPY  03/2010   Jule Ser); benign polyp, told to repeat 10 years  . EXAMINATION UNDER ANESTHESIA N/A 11/04/2014   Procedure: EXAM UNDER ANESTHESIA;  Surgeon: Leighton Ruff, MD;  Location: [facility];  Service: General;  Laterality: N/A;  . hemorrhoid banding    . SPHINCTEROTOMY N/A 11/04/2014   Procedure: CHEMICAL SPHINCTEROTOMY WITH BOTOX/ fisurectomy;  Surgeon: Leighton Ruff, MD;  Location: [place];  Service: General;  Laterality: N/A;  . TEAR DUCT PROBING  2006   left  . TONSILLECTOMY  1986    There were no vitals filed for this visit.      Subjective Assessment - 11/20/16 1401    Subjective I'm doing OK.  I have mixed feelings.  Last time was tough with needling with soreness.  Some shooting pain up the arm but lifting coffee mug is still painful.  Unsure if improvement but I can sit better in new chair.  Left > right LBP noticed with sidelying last night.     Currently in Pain? Yes   Pain Score 3    Pain  Location Elbow   Pain Orientation Right   Pain Type Chronic pain   Aggravating Factors  movement; lifting coffee cup   Pain Score 4   Pain Location Back   Pain Orientation Right;Left   Pain Type Chronic pain   Pain Relieving Factors forward bending and hanging;  walk with big strides                         [facility] Adult PT Treatment/Exercise - 11/20/16 0001      Neuro Re-ed    Neuro Re-ed Details  activation of lumbar multifidi      Lumbar Exercises: Prone   Other Prone Lumbar Exercises multifidi press 5x; with HS curl, hip extension, bent knee hip extension 5x each right/left     Electrical Stimulation   Electrical Stimulation Location lumbar   Electrical Stimulation Action pre mod with dry needles to bilateral multifidi   Electrical Stimulation Parameters 1.5 V 10 min   Electrical Stimulation Goals Pain     Iontophoresis   Type of Iontophoresis Dexamethasone   Location right elbow   Dose 4 mg/ml 1 cc   Time 4-6 hour patch     Manual Therapy   Muscle Energy Technique bilateral lumbar paraspinals, gluteals and piriformis  Trigger Point Dry Needling - 11/20/16 1601    Consent Given? Yes   Muscles Treated Lower Body --  bilateral lumbar multifidi with muscle twitches produced              PT Education - 11/20/16 1437    Education provided Yes   Education Details elbow eccentrics;  multifidi press; ionto instructions   Person(s) Educated Patient   Methods Explanation;Demonstration;Handout   Comprehension Verbalized understanding;Returned demonstration          PT Short Term Goals - 11/20/16 1606      PT SHORT TERM GOAL #1   Title be independent in initial HEP   Status Achieved     PT [place] #2   Title report < or = to 5/10 Rt elbow pain with use   Status Achieved     PT SHORT TERM GOAL #3   Title report a 30% reduction in the frequency and intensity of Rt elbow pain with ADLs and work tasks   Time 4   Period  Weeks   Status On-going     PT SHORT TERM GOAL #4   Title stand/walk for 20 minutes before LBP increases   Time 4   Period Weeks   Status On-going           PT Long Term Goals - 11/20/16 1606      PT LONG TERM GOAL #1   Title be independent in advanced HEP   Time 8   Period Weeks   Status On-going     PT LONG TERM GOAL #2   Title reduce FOTO to < or = to 36% limitation   Time 8   Period Weeks   Status On-going     PT LONG TERM GOAL #3   Title report a 70% reduction in Rt elbow pain with home and work tasks   Time 8   Period Weeks   Status On-going     PT LONG TERM GOAL #4   Title report normal use of Rt UE with daily tasks without limitation due to pain   Time 8   Period Weeks   Status On-going     PT LONG TERM GOAL #5   Title demonstrate and verbalize correct body mechanics to protect lumbar spine with ADLs and self-care   Time 8   Period Weeks   Status On-going     PT LONG TERM GOAL #6   Title stand/walk for > or = to 30 minutes without increase in LBP   Time 8   Period Weeks   Status On-going               Plan - 11/20/16 1602    Clinical Impression Statement The patient reports variable response but reports some functional improvements.  Alterred treatment interventions for hopefully longer lasting and more significant pain reduction.  Initiated wrist eccentrics for right elbow and activation of lumbar multifidi without gluteal activation for stabilization of lumbo-pelvic region.  Tactile and verbal cues needed.  Patient receptive to initiating iontophoresis secondary to slow improvements of right elbow pain.   Back pain chronicity has also slowed progress.      PT Next Visit Plan assess response to dry needling #3/4, assess response to DN with e-stim and initiation of ionto to elbow;  review multifidi press; try kinesiotape lumbar        Patient will benefit from skilled therapeutic intervention in order to improve the following deficits and  impairments:  Visit Diagnosis: Pain in right elbow  Chronic bilateral low back pain without sciatica     Problem List There are no active problems to display for this patient.  [name], PT 11/20/16 4:18 PM Phone: [phone] Fax: [phone]  [name] 11/20/2016, 4:17 PM  [facility] [address] [place], [place], [address] Phone: [phone]   Fax:  [phone]  Name: Barbara Oconnor MRN: [id number] Date of Birth: 05/09/1966

## 2016-11-27 ENCOUNTER — Ambulatory Visit: Payer: BLUE CROSS/BLUE SHIELD | Attending: Physician Assistant | Admitting: Physical Therapy

## 2016-11-27 DIAGNOSIS — M25521 Pain in right elbow: Secondary | ICD-10-CM | POA: Insufficient documentation

## 2016-11-27 DIAGNOSIS — M545 Low back pain, unspecified: Secondary | ICD-10-CM

## 2016-11-27 DIAGNOSIS — G8929 Other chronic pain: Secondary | ICD-10-CM | POA: Insufficient documentation

## 2016-11-27 NOTE — Therapy (Signed)
[facility] [address], [place], [place], [address] Phone: [phone]   Fax:  [phone]  Physical Therapy Treatment  Patient Details  Name: Barbara Oconnor MRN: [id number] Date of Birth: 11/10/1965 Referring Provider: Erskine Emery, [facility]  Encounter Date: 11/27/2016      PT End of Session - 11/27/16 [id number]    Visit Number 7   Number of Visits 10   Date for PT Re-Evaluation 12/26/16   Authorization Type 20 visit limit   PT Start Time 1400   PT Stop Time 1445   PT Time Calculation (min) 45 min   Activity Tolerance Patient tolerated treatment well      Past Medical History:  Diagnosis Date  . Complication of anesthesia    slow awakening  . Goiter 2002   u/s 2002, 2010 (Dr. Buddy Duty)  . H/O motion sickness   . Low back pain 10/2010  . Multiple thyroid nodules   . Rectal bleeding    anal fissure    Past Surgical History:  Procedure Laterality Date  . COLONOSCOPY  03/2010   Jule Ser); benign polyp, told to repeat 10 years  . EXAMINATION UNDER ANESTHESIA N/A 11/04/2014   Procedure: EXAM UNDER ANESTHESIA;  Surgeon: Leighton Ruff, MD;  Location[facility];  Service: General;  Laterality: N/A;  . hemorrhoid banding    . SPHINCTEROTOMY N/A 11/04/2014   Procedure: CHEMICAL SPHINCTEROTOMY WITH BOTOX/ fisurectomy;  Surgeon: Leighton Ruff, MD;  Location: [place];  Service: General;  Laterality: N/A;  . TEAR DUCT PROBING  2006   left  . TONSILLECTOMY  1986    There were no vitals filed for this visit.      Subjective Assessment - 11/27/16 1400    Subjective I'm trying to find out how much these procedures cost.  I think the ionto patch helped but only temporarily.  Achy by Saturday.  I think the dry needling with e-stim made me a little nauseated.   I had to walk when I got home because it was tight.   I did the multifidi exercises no problem, not sure about the elbow  exercises.     Currently in Pain? Yes   Pain Score 3    Pain Location Elbow   Pain Orientation Right   Pain Type Chronic pain   Pain Score 3   Pain Location Back                         [facility] Adult PT Treatment/Exercise - 11/27/16 0001      Elbow Exercises   Other elbow exercises review of wrist eccentrics      Lumbar Exercises: Prone   Other Prone Lumbar Exercises review of multifidi press series per HEP     Electrical Stimulation   Electrical Stimulation Location lumbar   Electrical Stimulation Action pre-mod with lumbar multifidi   Electrical Stimulation Parameters 1.25 V 10 min   Electrical Stimulation Goals Pain     Iontophoresis   Type of Iontophoresis Dexamethasone   Location right elbow   Dose 4 mg/ml 1cc #2   Time 4-6 hour patch     Manual Therapy   Manual Therapy Myofascial release   Soft tissue mobilization [place] G4 triceps and wrist extensor wad;  bilateral lumbar mulfitidi   Myofascial Release bilateral lumbar paraspinals          Trigger Point Dry Needling - 11/27/16 1737    Consent  Given? Yes   Muscles Treated Lower Body --  bilateral lumbar multifidi                PT Short Term Goals - 11/27/16 1744      PT SHORT TERM GOAL #1   Title be independent in initial HEP   Status Achieved     PT SHORT TERM GOAL #2   Title report < or = to 5/10 Rt elbow pain with use   Status Achieved     PT SHORT TERM GOAL #3   Title report a 30% reduction in the frequency and intensity of Rt elbow pain with ADLs and work tasks   Time 4   Period Weeks   Status On-going     PT SHORT TERM GOAL #4   Title stand/walk for 20 minutes before LBP increases   Time 4   Period Weeks   Status On-going           PT Long Term Goals - 11/27/16 1745      PT LONG TERM GOAL #1   Title be independent in advanced HEP   Time 8   Period Weeks   Status On-going     PT LONG TERM GOAL #2   Title reduce FOTO to < or = to 36% limitation    Time 8   Period Weeks   Status On-going     PT LONG TERM GOAL #3   Title report a 70% reduction in Rt elbow pain with home and work tasks   Period Weeks   Status On-going     PT LONG TERM GOAL #4   Title report normal use of Rt UE with daily tasks without limitation due to pain   Time 8   Period Weeks   Status On-going     PT LONG TERM GOAL #5   Title demonstrate and verbalize correct body mechanics to protect lumbar spine with ADLs and self-care   Period Weeks   Status On-going     PT LONG TERM GOAL #6   Title stand/walk for > or = to 30 minutes without increase in LBP   Time 8   Period Weeks   Status On-going               Plan - 11/27/16 1738    Clinical Impression Statement Patient has variable responses for treatment of her right elbow and back pain.  She reports temporary improvement with ionto to elbow although she found the battery of the patch to be irritating.   She is unsure of her response to dry needling with electrical stimulation added but would like to continue with another trial of this today.  She reports a good understanding of lumbar multifidi muscle activation but is unsure of elbow eccentrics so this HEP was reviewed today.  Progress has been slowed by chronicity and multiple region musculoskeletal pain.     PT Next Visit Plan assess response to dry needling with e-stim to lumbar, continue ionto #3 to elbow;  try kinesiotape lumbar?;  manual therapy to right triceps      Patient will benefit from skilled therapeutic intervention in order to improve the following deficits and impairments:     Visit Diagnosis: Pain in right elbow  Chronic bilateral low back pain without sciatica     Problem List There are no active problems to display for this patient.  [name], PT 11/27/16 5:47 PM Phone: [phone] Fax: [phone]  [name] 11/27/2016,  5:47 PM  [facility] [address] [place], [place], [address] Phone: [phone]   Fax:  [phone]  Name: EFRAIN GLATFELTER MRN: [id number] Date of Birth: 14-May-1966

## 2016-12-04 ENCOUNTER — Ambulatory Visit: Payer: BLUE CROSS/BLUE SHIELD | Admitting: Physical Therapy

## 2016-12-04 DIAGNOSIS — M25521 Pain in right elbow: Secondary | ICD-10-CM | POA: Diagnosis not present

## 2016-12-04 DIAGNOSIS — G8929 Other chronic pain: Secondary | ICD-10-CM

## 2016-12-04 DIAGNOSIS — M545 Low back pain: Secondary | ICD-10-CM | POA: Diagnosis not present

## 2016-12-04 NOTE — Therapy (Signed)
[facility] [address] [place], [place], [address] Phone: [phone]   Fax:  [phone]  Physical Therapy Treatment  Patient Details  Name: Barbara Oconnor MRN: [id number] Date of Birth: Feb 11, 1966 Referring Provider: Erskine Emery, [facility]  Encounter Date: 12/04/2016    Past Medical History:  Diagnosis Date  . Complication of anesthesia    slow awakening  . Goiter 2002   u/s 2002, 2010 (Dr. Buddy Duty)  . H/O motion sickness   . Low back pain 10/2010  . Multiple thyroid nodules   . Rectal bleeding    anal fissure    Past Surgical History:  Procedure Laterality Date  . COLONOSCOPY  03/2010   Jule Ser); benign polyp, told to repeat 10 years  . EXAMINATION UNDER ANESTHESIA N/A 11/04/2014   Procedure: EXAM UNDER ANESTHESIA;  Surgeon: Leighton Ruff, MD;  Location: [facility];  Service: General;  Laterality: N/A;  . hemorrhoid banding    . SPHINCTEROTOMY N/A 11/04/2014   Procedure: CHEMICAL SPHINCTEROTOMY WITH BOTOX/ fisurectomy;  Surgeon: Leighton Ruff, MD;  Location: [facility];  Service: General;  Laterality: N/A;  . TEAR DUCT PROBING  2006   left  . TONSILLECTOMY  1986    There were no vitals filed for this visit.      Subjective Assessment - 12/04/16 1356    Subjective I've had a pretty good with my arm.  The battery of the ionto patch was OK.  I think I want to continue the patch trial.  Elbow improvement 45%.    I'm sitting better in the chair in the living room.  I'm driving to [place] this afternoon.     Currently in Pain? Yes   Pain Score --  pt. unable to give score   Pain Location Elbow   Pain Type Chronic pain   Pain Score --  less buttock pain;  unable to give pain score    Pain Location Back   Pain Orientation Left   Pain Type Chronic pain                         [facility] Adult PT Treatment/Exercise - 12/04/16 0001      Elbow Exercises   Other elbow exercises review of wrist eccentrics   with correction of technique   Other elbow exercises discuss hammer pronation/supination     Moist Heat Therapy   Number Minutes Moist Heat 5 Minutes   Moist Heat Location Lumbar Spine     Electrical Stimulation   Electrical Stimulation Location lumbar   Electrical Stimulation Action pre-mod   Electrical Stimulation Parameters 1.25 V 10 min     Iontophoresis   Type of Iontophoresis Dexamethasone   Location right elbow   Dose 4 mg/ml 1cc #3   Time 4-6 hour patch     Manual Therapy   Soft tissue mobilization right gluteals and bilateral lumbar paraspinals           Trigger Point Dry Needling - 12/04/16 1423    Muscles Treated Lower Body --  bilateral lumbar multifidi   Gluteus Maximus Response Twitch response elicited;Palpable increased muscle length   Gluteus Minimus Response Twitch response elicited;Palpable increased muscle length              PT Education - 12/04/16 1740    Education provided Yes   Education Details Radio broadcast assistant) Educated Patient   Methods Explanation;Demonstration;Handout   Comprehension Verbalized  understanding          PT Short Term Goals - 12/04/16 1741      PT SHORT TERM GOAL #1   Title be independent in initial HEP   Status Achieved     PT SHORT TERM GOAL #2   Title report < or = to 5/10 Rt elbow pain with use   Status Achieved     PT SHORT TERM GOAL #3   Title report a 30% reduction in the frequency and intensity of Rt elbow pain with ADLs and work tasks   Status Achieved     PT SHORT TERM GOAL #4   Title stand/walk for 20 minutes before LBP increases   Time 4   Period Weeks   Status On-going           PT Long Term Goals - 12/04/16 1741      PT LONG TERM GOAL #1   Title be independent in advanced HEP   Time 8   Period Weeks   Status On-going     PT LONG TERM GOAL #2   Title reduce FOTO to < or = to 36% limitation   Time 8    Period Weeks   Status On-going     PT LONG TERM GOAL #3   Title report a 70% reduction in Rt elbow pain with home and work tasks   Time 8   Period Weeks   Status On-going     PT LONG TERM GOAL #4   Title report normal use of Rt UE with daily tasks without limitation due to pain   Time 8   Period Weeks   Status On-going     PT LONG TERM GOAL #5   Title demonstrate and verbalize correct body mechanics to protect lumbar spine with ADLs and self-care   Status Achieved     PT LONG TERM GOAL #6   Title stand/walk for > or = to 30 minutes without increase in LBP   Time 8   Period Weeks   Status On-going               Plan - 12/04/16 1738    Clinical Impression Statement Patient reports a good response from last treatment especially for right elbow pain.  Continued ionto trial.  She has tender points in right gluteals and bilateral lumbar multifidi.  Twitch responses produced (good prognostic indicator).  Partial progress with rehab goals.  Slower improvements secondary to chronicity and multi region body pain.     PT Next Visit Plan assess response to dry needling with e-stim to lumbar, continue ionto #4 to elbow;  try kinesiotape lumbar?;  manual therapy to right triceps as needed;  assess walking tolerance for STG      Patient will benefit from skilled therapeutic intervention in order to improve the following deficits and impairments:     Visit Diagnosis: Pain in right elbow  Chronic bilateral low back pain without sciatica     Problem List There are no active problems to display for this patient.   [name] 12/04/2016, 5:45 PM  [facility] [address] [place], [place], [address] Phone: [phone]   Fax:  [phone]  Name: Barbara Oconnor MRN: [id number] Date of Birth: 05/09/1966

## 2016-12-04 NOTE — Therapy (Signed)
[facility] [address] [place], [place], [address] Phone: [phone]   Fax:  [phone]  Physical Therapy Treatment  Patient Details  Name: Barbara Oconnor MRN: [id number] Date of Birth: Jul 31, 1966 Referring Provider: Erskine Emery, [facility]  Encounter Date: 12/04/2016      PT End of Session - 12/04/16 1746    Visit Number 8   Number of Visits 10   Date for PT Re-Evaluation 12/26/16   Authorization Type 20 visit limit   PT Start Time 1400   PT Stop Time 1445   PT Time Calculation (min) 45 min   Activity Tolerance Patient tolerated treatment well      Past Medical History:  Diagnosis Date  . Complication of anesthesia    slow awakening  . Goiter 2002   u/s 2002, 2010 (Dr. Buddy Duty)  . H/O motion sickness   . Low back pain 10/2010  . Multiple thyroid nodules   . Rectal bleeding    anal fissure    Past Surgical History:  Procedure Laterality Date  . COLONOSCOPY  03/2010   Jule Ser); benign polyp, told to repeat 10 years  . EXAMINATION UNDER ANESTHESIA N/A 11/04/2014   Procedure: EXAM UNDER ANESTHESIA;  Surgeon: Leighton Ruff, MD;  Location: [facility];  Service: General;  Laterality: N/A;  . hemorrhoid banding    . SPHINCTEROTOMY N/A 11/04/2014   Procedure: CHEMICAL SPHINCTEROTOMY WITH BOTOX/ fisurectomy;  Surgeon: Leighton Ruff, MD;  Location: [facility];  Service: General;  Laterality: N/A;  . TEAR DUCT PROBING  2006   left  . TONSILLECTOMY  1986    There were no vitals filed for this visit.      Subjective Assessment - 12/04/16 1356    Subjective I've had a pretty good with my arm.  The battery of the ionto patch was OK.  I think I want to continue the patch trial.  Elbow improvement 45%.    I'm sitting better in the chair in the living room.  I'm driving to [place] this afternoon.     Currently in Pain? Yes   Pain Score --  pt. unable to give score   Pain  Location Elbow   Pain Type Chronic pain   Pain Score --  less buttock pain;  unable to give pain score    Pain Location Back   Pain Orientation Left   Pain Type Chronic pain                         [facility] Adult PT Treatment/Exercise - 12/04/16 0001      Elbow Exercises   Other elbow exercises review of wrist eccentrics   with correction of technique   Other elbow exercises discuss hammer pronation/supination     Moist Heat Therapy   Number Minutes Moist Heat 5 Minutes   Moist Heat Location Lumbar Spine     Electrical Stimulation   Electrical Stimulation Location lumbar   Electrical Stimulation Action pre-mod   Electrical Stimulation Parameters 1.25 V 10 min     Iontophoresis   Type of Iontophoresis Dexamethasone   Location right elbow   Dose 4 mg/ml 1cc #3   Time 4-6 hour patch     Manual Therapy   Soft tissue mobilization right gluteals and bilateral lumbar paraspinals           Trigger Point Dry Needling - 12/04/16 1423    Muscles Treated Lower Body --  bilateral lumbar multifidi   Gluteus Maximus Response Twitch response elicited;Palpable increased muscle length   Gluteus Minimus Response Twitch response elicited;Palpable increased muscle length              PT Education - 12/04/16 1740    Education provided Yes   Education Details hammer pronation/supination   Person(s) Educated Patient   Methods Explanation;Demonstration;Handout   Comprehension Verbalized understanding          PT Short Term Goals - 12/04/16 1741      PT SHORT TERM GOAL #1   Title be independent in initial HEP   Status Achieved     PT SHORT TERM GOAL #2   Title report < or = to 5/10 Rt elbow pain with use   Status Achieved     PT SHORT TERM GOAL #3   Title report a 30% reduction in the frequency and intensity of Rt elbow pain with ADLs and work tasks   Status Achieved     PT SHORT TERM GOAL #4   Title stand/walk for 20 minutes before LBP increases    Time 4   Period Weeks   Status On-going           PT Long Term Goals - 12/04/16 1741      PT LONG TERM GOAL #1   Title be independent in advanced HEP   Time 8   Period Weeks   Status On-going     PT LONG TERM GOAL #2   Title reduce FOTO to < or = to 36% limitation   Time 8   Period Weeks   Status On-going     PT LONG TERM GOAL #3   Title report a 70% reduction in Rt elbow pain with home and work tasks   Time 8   Period Weeks   Status On-going     PT LONG TERM GOAL #4   Title report normal use of Rt UE with daily tasks without limitation due to pain   Time 8   Period Weeks   Status On-going     PT LONG TERM GOAL #5   Title demonstrate and verbalize correct body mechanics to protect lumbar spine with ADLs and self-care   Status Achieved     PT LONG TERM GOAL #6   Title stand/walk for > or = to 30 minutes without increase in LBP   Time 8   Period Weeks   Status On-going               Plan - 12/04/16 1738    Clinical Impression Statement Patient reports a good response from last treatment especially for right elbow pain.  Continued ionto trial.  She has tender points in right gluteals and bilateral lumbar multifidi.  Twitch responses produced (good prognostic indicator).  Partial progress with rehab goals.  Slower improvements secondary to chronicity and multi region body pain.     PT Next Visit Plan assess response to dry needling with e-stim to lumbar, continue ionto #4 to elbow;  try kinesiotape lumbar?;  manual therapy to right triceps as needed;  assess walking tolerance for STG      Patient will benefit from skilled therapeutic intervention in order to improve the following deficits and impairments:     Visit Diagnosis: Pain in right elbow  Chronic bilateral low back pain without sciatica     Problem List There are no active problems to display for this patient.  [name], PT 12/04/16 5:47 PM  Phone: [phone] Fax:  [phone]  [name] 12/04/2016, 5:46 PM  [facility] [address] [place], [place], [address] Phone: [phone]   Fax:  [phone]  Name: JOSHALYN ANCHETA MRN: [id number] Date of Birth: 09-03-1966

## 2016-12-04 NOTE — Patient Instructions (Signed)
Barbara Oconnor PT [facility] [address], [address] [place], [place] [address] Phone # [phone] Fax [phone]    

## 2016-12-04 NOTE — Progress Notes (Signed)
Chief Complaint  Patient presents with  . Annual Exam    nonfasting annual exam. Sees Dr Philis Pique. Recently had eye exam. Has been on gabapentin for several years now, wonders how long she wil need to be on this medication for.     Barbara Oconnor is a 51 y.o. female who presents for a complete physical. She sees Dr. Philis Pique for her GYN care.  She has the following concerns:  Discolored and thickened right great toenail.  5 years ago she took oral meds. It never got better (?thinks it maybe helped some?), did a second round of antifungal meds also.  It has never spread elsewhere. She is interested in treatment options. Having more achiness in the toe, if it gets wet. It is getting hard to cut due to the thickness.  She struggles to cut the nail (has not been soaking it prior).  Chronic back pain:  She no longer sees Dr. Lorin Mercy.  She takes neurontin and uses tramadol prn.  Last filled tramadol #40 in November, and prior to that, in June. She needs a refill, as she is going on a trip at the end of the month (she isn't completely out yet). Husband has been saying she is more forgetful--unsure if it is related to the medication, or just getting older--she finds that he is equally forgetful about things. Denies significant fogginess or sedation from the medication.  Questions as to whether or not she will need the neurontin long-term, or any long-term risks.  No longer getting the radiating pain into the legs or the pain in the skin since taking the neurontin, but doesn't feel like it actually helps much with the back pain . She generally requires tramadol twice a day when she is traveling, prolonged sitting.  Has been taking some related to physical therapy recently also.  She travels every 5 weeks.  Hot showers and heating pad is also helpful. She has some pain every day, but not all day. Better with good posture, getting up frequently from prolonged sitting.   Since taking the neurontin, she can  sleep easier at night, pain doesn't keep her up anymore, sleeps well.  Taking 2 at bedtime, and 1 in the morning.   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 She has modified her pilates to avoid aggravating her back. She had 2 cortisone injections in the past, reportedly didn't help all that much, pain just changed some.  She is getting dry needling and electric stimulation through [facility].  They are treating her right elbow and her back. Able to sit in the new chairs she got at home (which she couldn't prior to PT), so perhaps some help.  She "can't take ibuprofen"--inflames back more, as does meloxicam. Aleve constipates her, and she seems to have built up a tolerance and no longer helps. Tylenol sometimes will help, if she hasn't had it in a while. Topical medications haven't been helpful in the past.  She sees Dr. Philis Pique for GYN care, last seen in October, 2017, had mammogram (pt states no pap smear was done).  She is postmenopausal, but sometimes will spot after intercourse. She is on HRT and denies hot flashes.  H/o anal fissures. She had Botox to treat fissures, but doesn't really feel she got much benefit.  She changed her wiping habits (now uses moist wipes), and taking Miralax helps.  Overall doing better, infrequent mild bleeding.  Goiter: Hasn't seen Dr. Buddy Duty in years. Denies any change  in size, trouble swallowing. Denies hair/skin/bowel changes. Last year she started taking hair/nail vitamin because her nails were soft/ripping.That helped. She no longer takes them, and nails are soft again (after a nail treatment she had done at Christmas came off).  Immunization History  Administered Date(s) Administered  . Influenza Split 08/01/2010, 06/15/2011, 07/01/2013, 06/24/2014  . Influenza,inj,Quad PF,36+ Mos 06/01/2015  . Tdap 04/06/2011   Didn't get a flu shot this year Last Pap smear: 06/2016 (?pt states she didn't get one done, records indicated it  was?). Last mammogram: 06/2016 through Dr. Malachi Carl office Last colonoscopy: 2011; she was told to f/u in 10 years Last DEXA: never Dentist: twice yearly Ophtho: every other year, last 06/2016 Exercise: Teaches pilates once weekly.  No longer going to the gym Lipids: Lab Results  Component Value Date   CHOL 138 11/30/2015   HDL 52 11/30/2015   LDLCALC 76 11/30/2015   TRIG 50 11/30/2015   CHOLHDL 2.7 11/30/2015    Vitamin D-OH 54 in 11/2015 Normal c-met, TSH, Hg as well in 2017 Lab Results  Component Value Date   WBC 3.7 (L) 11/30/2015   HGB 14.0 11/30/2015   HCT 41.4 11/30/2015   MCV 93.9 11/30/2015   PLT 232 11/30/2015   Lab Results  Component Value Date   TSH 0.63 11/30/2015     Chemistry      Component Value Date/Time   NA 138 11/30/2015 0001   K 4.1 11/30/2015 0001   CL 102 11/30/2015 0001   CO2 26 11/30/2015 0001   BUN 11 11/30/2015 0001   CREATININE 0.91 11/30/2015 0001      Component Value Date/Time   CALCIUM 9.0 11/30/2015 0001   ALKPHOS 73 11/30/2015 0001   AST 20 11/30/2015 0001   ALT 13 11/30/2015 0001   BILITOT 0.4 11/30/2015 0001      ROS: The patient denies anorexia, fever, headaches, vision changes, decreased hearing, ear pain, sore throat, breast concerns, chest pain, palpitations, dizziness, syncope, dyspnea on exertion, cough, swelling, nausea, vomiting, diarrhea, abdominal pain, melena, indigestion/heartburn, hematuria, incontinence, dysuria, vaginal discharge, odor or itch, genital lesions, joint pains (just the back), numbness, tingling, weakness, tremor, suspicious skin lesions, depression, anxiety, abnormal bleeding/bruising, or enlarged lymph nodes.  Menopausal--on HRT without any significant hot flashes +constipation--controlled by Miralax. +BRBPR infrequently (doing better on the Miralax) +ongoing low back pain, chronic.  Intermittent vertigo (wqke up with the other morning), and some motion sickness in the car.    PHYSICAL  EXAM:  BP 120/78 (BP Location: Left Arm, Patient Position: Sitting, Cuff Size: Normal)   Pulse 64   Ht 5' 7.75" (1.721 m)   Wt 165 lb (74.8 kg)   BMI 25.27 kg/m    General Appearance:  Alert, cooperative, no distress, appears stated age.  Head:  Normocephalic, without obvious abnormality, atraumatic   Eyes:  PERRL, conjunctiva/corneas clear, EOM's intact, fundi benign   Ears:  Normal TM's and external ear canals   Nose:  Nares normal, mucosa normal, no drainage or sinus tenderness   Throat:  Lips, mucosa, and tongue normal; teeth and gums normal. Some cobblestoning noted posteriorly  Neck:  Supple, no lymphadenopathy; thyroid: Small thyroid nodule R lower lobe (pea-size), and some generalized enlargement of thyroid; nontender. no carotid bruit or JVD   Back:  Spine nontender, no muscle spasm. No CVA tenderness. Nontender at [facility] joints  Lungs:  Clear to auscultation bilaterally without wheezes, rales or ronchi; respirations unlabored   Chest Wall:  No tenderness or deformity  Heart:  Regular rate and rhythm, S1 and S2 normal, no murmur, rub or gallop   Breast Exam:  Deferred to GYN  Abdomen:  Soft, non-tender, nondistended, normoactive bowel sounds, no masses, no hepatosplenomegaly   Genitalia:  Deferred to GYN  Rectal:  Deferred to GYN  Extremities:  No clubbing, cyanosis or edema.   Pulses:  2+ and symmetric all extremities   Skin:  Skin color, texture, turgor normal, no rashes or lesions. Right great toenail--thickened and yellow. Surrounding skin is normal--no ingrowing nail, nontender, no erythema.  Lymph nodes:  Cervical, supraclavicular, and axillary nodes normal   Neurologic:  CNII-XII intact, normal strength, sensation and gait; reflexes 2+ and symmetric throughout     Psych:  Normal mood, affect, hygiene and grooming    ASSESSMENT/PLAN:  Annual physical exam - Plan: POCT  Urinalysis Dipstick  Chronic low back pain, unspecified back pain laterality, with sciatica presence unspecified - overall stable; currently getting PT (for back and R lat epicondylitis); continue gabapentin, tramadol prn - Plan: traMADol (ULTRAM) 50 MG tablet  Goiter - and thyroid nodule--stable in size.  no thyroid symptoms  Onychomycosis of right great toe - s/p oral antifungal tx x 2 per pt (and ?confirmed with cx?); Rec f/u with [facility] for other treatment options; soak nail prior to clipping.   onycomycosis vs other nail injury F/u with [place] extensively and all questions answered re: risks/side effects of gabapentin, and if/when/how to taper.  Rec continuing 3/d currently; can consider decreasing to 2/d (2qHS vs 1 BID) in future if improved. Continue tramadol prn severe pain, with traveling--risks/side effects reviewed.    Discussed monthly self breast exams and yearly mammograms; at least 30 minutes of aerobic activity at least 5 days/week, weight-bearing exercise at least 2-3x/wk; proper sunscreen use reviewed; healthy diet, including goals of calcium and vitamin D intake and alcohol recommendations (less than or equal to 1 drink/day) reviewed; regular seatbelt use; changing batteries in smoke detectors. Immunization recommendations discussed-- Encouraged yearly flu shots (didn't get this year, declines). Shingrix recommended. Colonoscopy recommendations reviewed, UTD   Labs not needed this year (no thyroid symptoms or ongoing significant bleeding).  F/u 1 year, sooner prn.  Will refill gabapentin for a year when next refill request comes through (in May)

## 2016-12-05 ENCOUNTER — Ambulatory Visit (INDEPENDENT_AMBULATORY_CARE_PROVIDER_SITE_OTHER): Payer: BLUE CROSS/BLUE SHIELD | Admitting: Family Medicine

## 2016-12-05 ENCOUNTER — Encounter: Payer: Self-pay | Admitting: Family Medicine

## 2016-12-05 VITALS — BP 120/78 | HR 64 | Ht 67.75 in | Wt 165.0 lb

## 2016-12-05 DIAGNOSIS — M545 Low back pain: Secondary | ICD-10-CM | POA: Diagnosis not present

## 2016-12-05 DIAGNOSIS — B351 Tinea unguium: Secondary | ICD-10-CM | POA: Diagnosis not present

## 2016-12-05 DIAGNOSIS — G8929 Other chronic pain: Secondary | ICD-10-CM

## 2016-12-05 DIAGNOSIS — Z0000 Encounter for general adult medical examination without abnormal findings: Secondary | ICD-10-CM | POA: Diagnosis not present

## 2016-12-05 DIAGNOSIS — E049 Nontoxic goiter, unspecified: Secondary | ICD-10-CM

## 2016-12-05 LAB — POCT URINALYSIS DIPSTICK
Bilirubin, UA: NEGATIVE
GLUCOSE UA: NEGATIVE
Ketones, UA: NEGATIVE
Leukocytes, UA: NEGATIVE
NITRITE UA: NEGATIVE
PROTEIN UA: NEGATIVE
RBC UA: NEGATIVE
SPEC GRAV UA: 1.015
UROBILINOGEN UA: NEGATIVE
pH, UA: 6

## 2016-12-05 MED ORDER — TRAMADOL HCL 50 MG PO TABS: ORAL_TABLET | ORAL | 0 refills | Status: DC

## 2016-12-05 NOTE — Patient Instructions (Addendum)
  HEALTH MAINTENANCE RECOMMENDATIONS:  It is recommended that you get at least 30 minutes of aerobic exercise at least 5 days/week (for weight loss, you may need as much as 60-90 minutes). This can be any activity that gets your heart rate up. This can be divided in 10-15 minute intervals if needed, but try and build up your endurance at least once a week.  Weight bearing exercise is also recommended twice weekly.  Eat a healthy diet with lots of vegetables, fruits and fiber.  "Colorful" foods have a lot of vitamins (ie green vegetables, tomatoes, red peppers, etc).  Limit sweet tea, regular sodas and alcoholic beverages, all of which has a lot of calories and sugar.  Up to 1 alcoholic drink daily may be beneficial for women (unless trying to lose weight, watch sugars).  Drink a lot of water.  Calcium recommendations are 1200-1500 mg daily (1500 mg for postmenopausal women or women without ovaries), and vitamin D 1000 IU daily.  This should be obtained from diet and/or supplements (vitamins), and calcium should not be taken all at once, but in divided doses.  Monthly self breast exams and yearly mammograms for women over the age of 19 is recommended.  Sunscreen of at least SPF 30 should be used on all sun-exposed parts of the skin when outside between the hours of 10 am and 4 pm (not just when at beach or pool, but even with exercise, golf, tennis, and yard work!)  Use a sunscreen that says "broad spectrum" so it covers both UVA and UVB rays, and make sure to reapply every 1-2 hours.  Remember to change the batteries in your smoke detectors when changing your clock times in the spring and fall.  Use your seat belt every time you are in a car, and please drive safely and not be distracted with cell phones and texting while driving.  I recommend getting the new shingles vaccine (Shingrix) when available. You will need to check with your insurance to see if it is covered.  It is a series of 2  injections, spaced 2 months apart.  I recommend following up with [facility] regarding your right great toenail. (their offices recently moved)

## 2016-12-10 ENCOUNTER — Encounter: Payer: Self-pay | Admitting: Physical Therapy

## 2016-12-10 ENCOUNTER — Ambulatory Visit: Payer: BLUE CROSS/BLUE SHIELD | Admitting: Physical Therapy

## 2016-12-10 DIAGNOSIS — G8929 Other chronic pain: Secondary | ICD-10-CM

## 2016-12-10 DIAGNOSIS — M545 Low back pain: Secondary | ICD-10-CM | POA: Diagnosis not present

## 2016-12-10 DIAGNOSIS — M25521 Pain in right elbow: Secondary | ICD-10-CM

## 2016-12-10 NOTE — Therapy (Signed)
[facility] [address], [place], [place], [address] Phone: [phone]   Fax:  [phone]  Physical Therapy Treatment  Patient Details  Name: Barbara Oconnor MRN: [id number] Date of Birth: 08/15/1966 Referring Provider: Erskine Emery, [facility]  Encounter Date: 12/10/2016      PT End of Session - 12/10/16 1318    Visit Number 9   Number of Visits 10   Date for PT Re-Evaluation 12/26/16   Authorization Type 20 visit limit   PT Start Time 1233   PT Stop Time 1327   PT Time Calculation (min) 54 min   Activity Tolerance Patient tolerated treatment well   Behavior During Therapy [facility] for tasks assessed/performed      Past Medical History:  Diagnosis Date  . Complication of anesthesia    slow awakening  . Goiter 2002   u/s 2002, 2010 (Dr. Buddy Duty)  . H/O motion sickness   . Low back pain 10/2010  . Multiple thyroid nodules   . Rectal bleeding    anal fissure    Past Surgical History:  Procedure Laterality Date  . COLONOSCOPY  03/2010   Jule Ser); benign polyp, told to repeat 10 years  . EXAMINATION UNDER ANESTHESIA N/A 11/04/2014   Procedure: EXAM UNDER ANESTHESIA;  Surgeon: Leighton Ruff, MD;  Location: [facility];  Service: General;  Laterality: N/A;  . hemorrhoid banding    . SPHINCTEROTOMY N/A 11/04/2014   Procedure: CHEMICAL SPHINCTEROTOMY WITH BOTOX/ fisurectomy;  Surgeon: Leighton Ruff, MD;  Location: [place];  Service: General;  Laterality: N/A;  . TEAR DUCT PROBING  2006   left  . TONSILLECTOMY  1986    There were no vitals filed for this visit.      Subjective Assessment - 12/10/16 1235    Subjective I've been able to sit without the back pain a little better.  No soreness from dry needling.  Denies "extra" pain. Reports able to do some more things in pilates classes without needing to support low back with her hands.   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 pain with sitting > 1 hour    How long can you stand comfortably? too long is painful 10 minutes    How long can you walk comfortably? sometimes painful   Diagnostic tests x-ray: Rt elbow was negaitve, MRI of low back: 3 years ago.  L4-5 Lt disc protrusion.   Patient Stated Goals reduce Rt elbow pain, reduce LBP, know what exercises she can do   Currently in Pain? No/denies                         [facility] Adult PT Treatment/Exercise - 12/10/16 0001      Electrical Stimulation   Electrical Stimulation Location lumbar   Electrical Stimulation Action IFC   Electrical Stimulation Parameters 15 min to tolerance   Electrical Stimulation Goals Pain     Iontophoresis   Type of Iontophoresis Dexamethasone   Location right elbow   Dose 4 mg/ml 1cc #4   Time 4-6 hour patch     Manual Therapy   Manual Therapy Soft tissue mobilization;Myofascial release   Soft tissue mobilization lumbar and lower thoracic paraspinals for improved spine mobility, glutes and QL   Myofascial Release bilateral lumbar paraspinals  PT Short Term Goals - 12/04/16 1741      PT SHORT TERM GOAL #1   Title be independent in initial HEP   Status Achieved     PT SHORT TERM GOAL #2   Title report < or = to 5/10 Rt elbow pain with use   Status Achieved     PT SHORT TERM GOAL #3   Title report a 30% reduction in the frequency and intensity of Rt elbow pain with ADLs and work tasks   Status Achieved     PT SHORT TERM GOAL #4   Title stand/walk for 20 minutes before LBP increases   Time 4   Period Weeks   Status On-going           PT Long Term Goals - 12/10/16 1335      PT LONG TERM GOAL #1   Title be independent in advanced HEP   Time 8   Period Weeks   Status On-going     PT LONG TERM GOAL #2   Title reduce FOTO to < or = to 36% limitation   Time 8   Period Weeks    Status On-going     PT LONG TERM GOAL #3   Title report a 70% reduction in Rt elbow pain with home and work tasks   Time 8   Period Weeks   Status On-going     PT LONG TERM GOAL #4   Title report normal use of Rt UE with daily tasks without limitation due to pain   Time 8   Period Weeks   Status On-going               Plan - 12/10/16 1327    Clinical Impression Statement Pt reports she is doing well overall and responding well to manual and dry needling with no soreness afterwards.  She is able to do a little more in her pilates classes. Lumbar and multifidi cont to have tension.  Some stiffness in lower thoracic spine that released during manual treatment.  Continues to make progress needs PT for improved function and reduced pain.   Rehab Potential Good   PT Treatment/Interventions ADLs/Self Care Home Management;Electrical Stimulation;Iontophoresis 4mg /ml Dexamethasone;Functional mobility training;Ultrasound;Moist Heat;Therapeutic activities;Therapeutic exercise;Neuromuscular re-education;Patient/family education;Passive range of motion;Manual techniques;Dry needling;Taping   PT Next Visit Plan ionto #5 on elbow, manual as needed and assess thoracic mobility for good diaphragm  movement during exercises, assess walking tolerance for STG   Consulted and Agree with Plan of Care Patient      Patient will benefit from skilled therapeutic intervention in order to improve the following deficits and impairments:  Pain, Postural dysfunction, Impaired flexibility, Increased muscle spasms, Decreased activity tolerance, Difficulty walking  Visit Diagnosis: Pain in right elbow  Chronic bilateral low back pain without sciatica     Problem List There are no active problems to display for this patient.   Zannie Cove, PT 12/10/2016, 1:35 PM  [facility] [address], [place], [place], [address] Phone: [phone]    Fax:  [phone]  Name: CONNI KNIGHTON MRN: [id number] Date of Birth: March 11, 1966

## 2016-12-11 ENCOUNTER — Encounter: Payer: BLUE CROSS/BLUE SHIELD | Admitting: Physical Therapy

## 2016-12-13 ENCOUNTER — Ambulatory Visit: Payer: BLUE CROSS/BLUE SHIELD | Admitting: Physical Therapy

## 2016-12-13 DIAGNOSIS — M545 Low back pain: Secondary | ICD-10-CM

## 2016-12-13 DIAGNOSIS — M25521 Pain in right elbow: Secondary | ICD-10-CM | POA: Diagnosis not present

## 2016-12-13 DIAGNOSIS — G8929 Other chronic pain: Secondary | ICD-10-CM | POA: Diagnosis not present

## 2016-12-13 NOTE — Therapy (Signed)
[facility] [address], [place], [place], [address] Phone: [phone]   Fax:  [phone]  Physical Therapy Treatment  Patient Details  Name: Barbara Oconnor MRN: [id number] Date of Birth: Feb 03, 1966 Referring Provider: Erskine Emery, [facility]  Encounter Date: 12/13/2016      PT End of Session - 12/13/16 1407    Visit Number 10   Date for PT Re-Evaluation 12/26/16   Authorization Type 20 visit limit   PT Start Time 1400   PT Stop Time 1445   PT Time Calculation (min) 45 min   Activity Tolerance Patient tolerated treatment well      Past Medical History:  Diagnosis Date  . Complication of anesthesia    slow awakening  . Goiter 2002   u/s 2002, 2010 (Dr. Buddy Duty)  . H/O motion sickness   . Low back pain 10/2010  . Multiple thyroid nodules   . Rectal bleeding    anal fissure    Past Surgical History:  Procedure Laterality Date  . COLONOSCOPY  03/2010   Jule Ser); benign polyp, told to repeat 10 years  . EXAMINATION UNDER ANESTHESIA N/A 11/04/2014   Procedure: EXAM UNDER ANESTHESIA;  Surgeon: Leighton Ruff, MD;  Location: [facility];  Service: General;  Laterality: N/A;  . hemorrhoid banding    . SPHINCTEROTOMY N/A 11/04/2014   Procedure: CHEMICAL SPHINCTEROTOMY WITH BOTOX/ fisurectomy;  Surgeon: Leighton Ruff, MD;  Location: [place];  Service: General;  Laterality: N/A;  . TEAR DUCT PROBING  2006   left  . TONSILLECTOMY  1986    There were no vitals filed for this visit.      Subjective Assessment - 12/13/16 1359    Subjective I'm doing OK.  Going out of town next week.  Good Pilates day yesterday.  Slow and steady progress with elbow.  I've modified my activities still.   Working lower on lumbar area is helpful.   Elbow pain is 55% better.  Less radiating back pain and I can sit longer.     Currently in Pain? Yes   Pain Score 2    Pain Location Elbow   Pain  Orientation Right   Pain Type Chronic pain   Aggravating Factors  twist off medicine bottles   Pain Location Back   Pain Type Chronic pain   Aggravating Factors  anything too long            [facility] PT Assessment - 12/13/16 0001      Strength   Overall Strength Comments Grip 40# on right (45-50# on left)  Bent elbow 4+/5,;  mild pain with elbow extended                      [facility] Adult PT Treatment/Exercise - 12/13/16 0001      Moist Heat Therapy   Number Minutes Moist Heat 5 Minutes   Moist Heat Location Lumbar Spine     Electrical Stimulation   Electrical Stimulation Location lumbar   Electrical Stimulation Action pre-mod with dry needles   Electrical Stimulation Parameters 1.25 10 min to tolerance     Iontophoresis   Type of Iontophoresis Dexamethasone   Location right elbow   Dose 4 mg/ml 1cc #5   Time 4-6 hour patch     Manual Therapy   Soft tissue mobilization bilateral gluteals   Myofascial Release bilateral lumbar paraspinals          Trigger Point Dry  Needling - 12/13/16 1437    Consent Given? Yes   Muscles Treated Lower Body --  bilateral lumbar multifidi   Gluteus Maximus Response Twitch response elicited;Palpable increased muscle length   Gluteus Minimus Response Twitch response elicited;Palpable increased muscle length        Performed bilaterally         PT Short Term Goals - 12/13/16 1418      PT SHORT TERM GOAL #1   Title be independent in initial HEP   Status Achieved     PT SHORT TERM GOAL #2   Title report < or = to 5/10 Rt elbow pain with use   Status Achieved     PT SHORT TERM GOAL #3   Title report a 30% reduction in the frequency and intensity of Rt elbow pain with ADLs and work tasks     PT SHORT TERM GOAL #4   Title stand/walk for 20 minutes before LBP increases   Time 4   Period Weeks   Status On-going           PT Long Term Goals - 12/13/16 1416      PT LONG TERM GOAL #1   Title be independent  in advanced HEP   Status On-going     PT LONG TERM GOAL #2   Title reduce FOTO to < or = to 36% limitation   Time 8   Period Weeks   Status On-going     PT LONG TERM GOAL #3   Title report a 70% reduction in Rt elbow pain with home and work tasks   Time 8   Period Weeks   Status On-going     PT LONG TERM GOAL #4   Title report normal use of Rt UE with daily tasks without limitation due to pain   Time 8   Period Weeks   Status On-going     PT LONG TERM GOAL #5   Title demonstrate and verbalize correct body mechanics to protect lumbar spine with ADLs and self-care   Status Achieved     PT LONG TERM GOAL #6   Title stand/walk for > or = to 30 minutes without increase in LBP   Time 8   Period Weeks   Status On-going               Plan - 12/13/16 1439    Clinical Impression Statement The patient reports significant improvement in her elbow pain and function (55% improvement) and less intense radiating back pain.  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  Good progress toward goals.     PT Next Visit Plan reassess lumbar goals, FOTO; last ionto;  DN as needed;  assess response after trip to [place];  ERO      Patient will benefit from skilled therapeutic intervention in order to improve the following deficits and impairments:     Visit Diagnosis: Pain in right elbow  Chronic bilateral low back pain without sciatica     Problem List There are no active problems to display for this patient.  [name], PT 12/13/16 3:49 PM Phone: [phone] Fax: [phone]  [name] 12/13/2016, 3:48 PM  [facility] [address] [place], [place], [address] Phone: [phone]   Fax:  [phone]  Name: Barbara Oconnor MRN: [id number] Date of Birth: [birth year]

## 2017-01-30 ENCOUNTER — Ambulatory Visit: Payer: BLUE CROSS/BLUE SHIELD | Attending: Physician Assistant

## 2017-01-30 DIAGNOSIS — G8929 Other chronic pain: Secondary | ICD-10-CM | POA: Diagnosis not present

## 2017-01-30 DIAGNOSIS — M25521 Pain in right elbow: Secondary | ICD-10-CM | POA: Diagnosis not present

## 2017-01-30 DIAGNOSIS — M545 Low back pain: Secondary | ICD-10-CM | POA: Insufficient documentation

## 2017-01-30 NOTE — Therapy (Addendum)
[facility] [address] [place], [place], [address] Phone: [phone]   Fax:  [phone]  Physical Therapy Treatment  Patient Details  Name: Barbara Oconnor MRN: [id number] Date of Birth: [birth year] Referring Provider: Erskine Emery, [facility]  Encounter Date: 01/30/2017      PT End of Session - 01/30/17 1305    Visit Number 11   Date for PT Re-Evaluation 05/02/17   Authorization Type 20 visit limit   Authorization - Visit Number 11   Authorization - Number of Visits 20   PT Start Time 1230   PT Stop Time 1316   PT Time Calculation (min) 46 min   Activity Tolerance Patient tolerated treatment well   Behavior During Therapy [facility] for tasks assessed/performed      Past Medical History:  Diagnosis Date  . Complication of anesthesia    slow awakening  . Goiter 2002   u/s 2002, 2010 (Dr. Buddy Duty)  . H/O motion sickness   . Low back pain 10/2010  . Multiple thyroid nodules   . Rectal bleeding    anal fissure    Past Surgical History:  Procedure Laterality Date  . COLONOSCOPY  03/2010   Jule Ser); benign polyp, told to repeat 10 years  . EXAMINATION UNDER ANESTHESIA N/A 11/04/2014   Procedure: EXAM UNDER ANESTHESIA;  Surgeon: Leighton Ruff, MD;  Location: [facility];  Service: General;  Laterality: N/A;  . hemorrhoid banding    . SPHINCTEROTOMY N/A 11/04/2014   Procedure: CHEMICAL SPHINCTEROTOMY WITH BOTOX/ fisurectomy;  Surgeon: Leighton Ruff, MD;  Location: [place];  Service: General;  Laterality: N/A;  . TEAR DUCT PROBING  2006   left  . TONSILLECTOMY  1986    There were no vitals filed for this visit.      Subjective Assessment - 01/30/17 1231    Subjective Lapse in treatment 12/13/16-01/30/17.  Pt reports that LBP is worse.  No change in Rt elbow.     Pertinent History Pt is a Scientist, product/process development   Diagnostic tests x-ray: Rt elbow was negaitve, MRI of low back: 3 years  ago.  L4-5 Lt disc protrusion.   Patient Stated Goals reduce Rt elbow pain, reduce LBP, know what exercises she can do   Currently in Pain? Yes   Pain Score 4    Pain Location Elbow   Pain Orientation Right   Pain Descriptors / Indicators Sore   Pain Type Chronic pain   Pain Onset More than a month ago   Pain Frequency Intermittent   Aggravating Factors  opening bottles, holding a coffee cup, shaking someone's hand   Pain Relieving Factors rest, weating brace   Pain Score 6   Pain Location Back   Pain Orientation Left   Pain Descriptors / Indicators Stabbing;Burning   Pain Type Chronic pain   Pain Onset More than a month ago   Pain Frequency Intermittent   Aggravating Factors  activity, sitting too long, standing too long   Pain Relieving Factors change of position, movement/stretching            [facility] PT Assessment - 01/30/17 0001      Assessment   Medical Diagnosis Rt elbow lateral epicondylitis, chronic low back pain     Prior Function   Level of Independence Independent     Cognition   Overall Cognitive Status Within Functional Limits for tasks assessed     Observation/Other Assessments   Focus on Therapeutic Outcomes (FOTO)  52% limitation     Strength   Overall Strength Comments 36# limitation     Palpation   Palpation comment trigger points in Rt>Lt gluteals, no tenderness in elbow of forearm today.      Transfers   Transfers Independent with all Transfers                     [facility] Adult PT Treatment/Exercise - 01/30/17 0001      Moist Heat Therapy   Number Minutes Moist Heat 10 Minutes   Moist Heat Location Lumbar Spine     Manual Therapy   Manual Therapy Soft tissue mobilization;Myofascial release   Soft tissue mobilization bilateral gluteals          Trigger Point Dry Needling - 01/30/17 1239    Consent Given? Yes   Muscles Treated Lower Body Gluteus minimus;Gluteus maximus   Gluteus Maximus Response Twitch response  elicited;Palpable increased muscle length   Gluteus Minimus Response Twitch response elicited;Palpable increased muscle length                PT Short Term Goals - 01/30/17 1234      PT SHORT TERM GOAL #1   Title be independent in initial HEP   Status Achieved     PT SHORT TERM GOAL #2   Title report < or = to 5/10 Rt elbow pain with use   Status Achieved     PT SHORT TERM GOAL #3   Title report a 30% reduction in the frequency and intensity of Rt elbow pain with ADLs and work tasks   Baseline 50% better   Status Achieved     PT SHORT TERM GOAL #4   Title stand/walk for 20 minutes before LBP increases   Baseline varies   Time 4   Period Weeks   Status On-going           PT Long Term Goals - 01/30/17 1235      PT LONG TERM GOAL #1   Title be independent in advanced HEP   Time 12   Period Weeks   Status On-going     PT LONG TERM GOAL #2   Title reduce FOTO to < or = to 36% limitation   Baseline 52%    Time 12   Period Weeks   Status On-going     PT LONG TERM GOAL #3   Title report a 70% reduction in Rt elbow pain with home and work tasks   Baseline 50% improvement     PT LONG TERM GOAL #4   Title report normal use of Rt UE with daily tasks without limitation due to pain   Time 12   Period Weeks   Status On-going     PT LONG TERM GOAL #5   Title demonstrate and verbalize correct body mechanics to protect lumbar spine with ADLs and self-care   Status Achieved     PT LONG TERM GOAL #6   Title stand/walk for > or = to 30 minutes without increase in LBP   Time 12   Period Weeks   Status On-going               Plan - 01/30/17 1306    Clinical Impression Statement Pt with lapse in treatement.  Pt reports 50% overall improvement in elbow symtoms and no significant change in LBP due to flare-up recently.  Pt is active with pilates and does regular HEP flexibility and strength exercises.  Pt with trigger points in bil gluteals (Rt vs Lt).  Pt  would like to come to PT 1x/month for needling and advancement of HEP as needed.     Rehab Potential Good   PT Frequency Monthy   PT Duration 12 weeks   PT Treatment/Interventions ADLs/Self Care Home Management;Electrical Stimulation;Iontophoresis 95m/ml Dexamethasone;Functional mobility training;Ultrasound;Moist Heat;Therapeutic activities;Therapeutic exercise;Neuromuscular re-education;Patient/family education;Passive range of motion;Manual techniques;Dry needling;Taping   PT Next Visit Plan DN to low back or elbow as needed, advance exercises.  Pt wants to come 1x/month   Consulted and Agree with Plan of Care Patient      Patient will benefit from skilled therapeutic intervention in order to improve the following deficits and impairments:  Pain, Postural dysfunction, Impaired flexibility, Increased muscle spasms, Decreased activity tolerance, Difficulty walking  Visit Diagnosis: Pain in right elbow - Plan: PT plan of care cert/re-cert  Chronic bilateral low back pain without sciatica - Plan: PT plan of care cert/re-cert     Problem List There are no active problems to display for this patient.    KSigurd Sos PT 01/30/17 1:13 PM PHYSICAL THERAPY DISCHARGE SUMMARY  Visits from Start of Care: 11  Current functional level related to goals / functional outcomes: See above.  Pt didn't return after last session on 01/30/17.     Remaining deficits: See above for most current status.     Education / Equipment: HEP Plan: Patient agrees to discharge.  Patient goals were partially met. Patient is being discharged due to not returning since the last visit.  ?????        KSigurd Sos PT 04/17/17 1:55 PM  [facility] [address] [place] [address]: [phone]  Fax:  [phone] Name: Barbara BIRKLANDMRN: [id number] of Birth: 604-Dec-1967

## 2017-02-02 ENCOUNTER — Other Ambulatory Visit: Payer: Self-pay | Admitting: Family Medicine

## 2017-05-11 ENCOUNTER — Encounter: Payer: Self-pay | Admitting: Family Medicine

## 2017-07-10 DIAGNOSIS — Z124 Encounter for screening for malignant neoplasm of cervix: Secondary | ICD-10-CM | POA: Diagnosis not present

## 2017-07-10 DIAGNOSIS — Z1231 Encounter for screening mammogram for malignant neoplasm of breast: Secondary | ICD-10-CM | POA: Diagnosis not present

## 2017-07-10 DIAGNOSIS — Z6825 Body mass index (BMI) 25.0-25.9, adult: Secondary | ICD-10-CM | POA: Diagnosis not present

## 2017-07-10 DIAGNOSIS — Z01419 Encounter for gynecological examination (general) (routine) without abnormal findings: Secondary | ICD-10-CM | POA: Diagnosis not present

## 2017-07-10 LAB — HM PAP SMEAR

## 2017-07-10 LAB — RESULTS CONSOLE HPV: CHL HPV: NEGATIVE

## 2017-07-10 LAB — HM MAMMOGRAPHY

## 2017-07-11 ENCOUNTER — Encounter: Payer: Self-pay | Admitting: *Deleted

## 2017-07-16 ENCOUNTER — Telehealth (INDEPENDENT_AMBULATORY_CARE_PROVIDER_SITE_OTHER): Payer: Self-pay | Admitting: Orthopaedic Surgery

## 2017-07-16 NOTE — Telephone Encounter (Signed)
Patient called asking for a RX for PT for her elbow and back. CB # [phone]

## 2017-07-17 ENCOUNTER — Other Ambulatory Visit (INDEPENDENT_AMBULATORY_CARE_PROVIDER_SITE_OTHER): Payer: Self-pay

## 2017-07-17 DIAGNOSIS — G8929 Other chronic pain: Secondary | ICD-10-CM

## 2017-07-17 DIAGNOSIS — M545 Low back pain, unspecified: Secondary | ICD-10-CM

## 2017-07-17 NOTE — Telephone Encounter (Signed)
Faxed order to [facility] to call patient and schedule

## 2017-07-17 NOTE — Telephone Encounter (Signed)
Please advise 

## 2017-07-17 NOTE — Telephone Encounter (Signed)
Ok for [facility] to work with her.  She has been there already before and this year.  They can work on knee modalities to decrease her chronic back pain as well as her right elbow lateral epicondylitis.

## 2017-07-19 ENCOUNTER — Telehealth (INDEPENDENT_AMBULATORY_CARE_PROVIDER_SITE_OTHER): Payer: Self-pay | Admitting: Orthopaedic Surgery

## 2017-07-19 NOTE — Telephone Encounter (Signed)
Returned call to patient left message to call back [phone]

## 2017-07-24 ENCOUNTER — Ambulatory Visit (INDEPENDENT_AMBULATORY_CARE_PROVIDER_SITE_OTHER): Payer: BLUE CROSS/BLUE SHIELD | Admitting: Physician Assistant

## 2017-07-24 DIAGNOSIS — M7711 Lateral epicondylitis, right elbow: Secondary | ICD-10-CM

## 2017-07-24 DIAGNOSIS — M545 Low back pain, unspecified: Secondary | ICD-10-CM

## 2017-07-24 DIAGNOSIS — G8929 Other chronic pain: Secondary | ICD-10-CM

## 2017-07-24 MED ORDER — METHYLPREDNISOLONE ACETATE 40 MG/ML IJ SUSP
40.0000 mg | INTRAMUSCULAR | Status: AC | PRN
  Administered 2017-07-24: 40 mg

## 2017-07-24 MED ORDER — CYCLOBENZAPRINE HCL 10 MG PO TABS: 10.0000 mg | ORAL_TABLET | Freq: Three times a day (TID) | ORAL | 1 refills | Status: DC | PRN

## 2017-07-24 MED ORDER — LIDOCAINE HCL 1 % IJ SOLN
3.0000 mL | INTRAMUSCULAR | Status: AC | PRN
  Administered 2017-07-24: 3 mL

## 2017-07-24 NOTE — Progress Notes (Signed)
   Procedure Note  Patient: Barbara Oconnor             Date of Birth: 09/21/1966           MRN: [id number]             Visit Date: 07/24/2017 HPI [name] is a 51 year old female who returns today for right elbow pain.  She states that today she went to therapy and this did help but her pain is now returning.  She feels that she is about to have an active "flare".  She is unable to take NSAIDs.  She also has chronic low back pain which she seen Dr. Lorin Oconnor for the past.  She takes Neurontin 900 mg daily she states she is taken tramadol in the past but not travels a lot and feels that this causes her some motion sensitive issues.  She has significant she can take anything else whenever she is traveling.  Physical exam:Right elbow she has good range of motion elbow no erythema ecchymosis.  She has tenderness over the lateral condyle region.  She has some discomfort with extension of the wrist against resistance and no weakness.  Middle finger against extension resistance causes no discomfort.  She has no tenderness in the mid forearm.  She has full supination pronation.  Procedures: Visit Diagnoses: Lateral epicondylitis, right elbow  Chronic right-sided low back pain without sciatica  Hand/UE Inj Date/Time: 07/24/2017 2:35 PM Performed by: Pete Pelt Authorized by: Pete Pelt   Consent Given by:  Patient Site marked: the procedure site was marked   Indications:  Pain and therapeutic Condition: lateral epicondylitis   Needle Size:  25 G Approach:  Lateral Medications:  3 mL lidocaine 1 %; 40 mg methylPREDNISolone acetate 40 MG/ML   Plan: We will send her back to physical therapy but if her elbow and her back.  We will try some Flexeril and see if she is able to tolerate this is that the Robaxin did not work well for the past.  Also recommend that she try some massage therapy with a deep tissue massage therapy on her elbow.  She will follow-up with [place] on an as-needed basis  pain persist or becomes worse.

## 2017-08-01 ENCOUNTER — Encounter: Payer: Self-pay | Admitting: Physical Therapy

## 2017-08-01 ENCOUNTER — Ambulatory Visit: Payer: BLUE CROSS/BLUE SHIELD | Attending: Orthopaedic Surgery | Admitting: Physical Therapy

## 2017-08-01 DIAGNOSIS — M25521 Pain in right elbow: Secondary | ICD-10-CM

## 2017-08-01 DIAGNOSIS — M545 Low back pain: Secondary | ICD-10-CM | POA: Diagnosis not present

## 2017-08-01 DIAGNOSIS — R293 Abnormal posture: Secondary | ICD-10-CM

## 2017-08-01 DIAGNOSIS — G8929 Other chronic pain: Secondary | ICD-10-CM

## 2017-08-01 DIAGNOSIS — M62830 Muscle spasm of back: Secondary | ICD-10-CM

## 2017-08-01 NOTE — Therapy (Signed)
[facility] [address] [place], [place], [address] Phone: [phone]   Fax:  [phone]  Physical Therapy Evaluation  Patient Details  Name: Barbara Oconnor MRN: [id number] Date of Birth: [birth year] Referring Provider: Erskine Emery, [facility]   Encounter Date: 08/01/2017  PT End of Session - 08/01/17 1421    Visit Number  1    Number of Visits  16    Date for PT Re-Evaluation  09/26/17    Authorization Type  BCBS    Authorization Time Period  08/01/50/ to 09/26/16    PT Start Time  1147    PT Stop Time  1233    PT Time Calculation (min)  46 min    Activity Tolerance  Patient tolerated treatment well    Behavior During Therapy  [facility] for tasks assessed/performed       Past Medical History:  Diagnosis Date  . Complication of anesthesia    slow awakening  . Goiter 2002   u/s 2002, 2010 (Dr. Buddy Duty)  . H/O motion sickness   . Low back pain 10/2010  . Multiple thyroid nodules   . Rectal bleeding    anal fissure    Past Surgical History:  Procedure Laterality Date  . COLONOSCOPY  03/2010   Jule Ser); benign polyp, told to repeat 10 years  . hemorrhoid banding    . TEAR DUCT PROBING  2006   left  . TONSILLECTOMY  1986    There were no vitals filed for this visit.   Subjective Assessment - 08/01/17 1150    Subjective  Pt reports that she has been having low back pain for about 6 years so far. She feels that it's always there, maybe a 2/10. She reports pain going down the back of the legs as well, more on the Rt side than the Lt. She also has Rt elbow pain which has been going on for about a year or so. She received therapy a little over 6 months ago which she felt improved her pain and function.     Pertinent History  low back pain     Patient Stated Goals  improve low back pain, improve elbow pain    Currently in Pain?  Yes    Pain Score  4     Pain Location  Back    Pain Orientation  Mid;Lower    Pain  Descriptors / Indicators  Aching    Pain Type  Chronic pain    Pain Radiating Towards  none     Pain Onset  More than a month ago    Pain Frequency  Constant    Aggravating Factors   alot of yardwork/housework    Pain Relieving Factors  heat, movement/walking, stretching     Effect of Pain on Daily Activities  moderate limitation         [facility] PT Assessment - 08/01/17 0001      Assessment   Next MD Visit  none for now       Precautions   Precautions  None      Balance Screen   Has the patient fallen in the past 6 months  No    Has the patient had a decrease in activity level because of a fear of falling?   No    Is the patient reluctant to leave their home because of a fear of falling?   No      Prior Function   Vocation Requirements  desk work, teaches pilates class 1x/week       Cognition   Overall Cognitive Status  Within Functional Limits for tasks assessed      Observation/Other Assessments   Focus on Therapeutic Outcomes (FOTO)   46% limited      Sensation   Light Touch  Appears Intact      Functional Tests   Functional tests  Single Leg Squat;Single leg stance      Single Leg Squat   Comments  (+) valgus and trunk rotation greater on the Rt, unable to complete without UE support       Single Leg Stance   Comments  (+) trunk rotation each LE      ROM / Strength   AROM / PROM / Strength  AROM;Strength      AROM   Overall AROM   Within functional limits for tasks performed    Overall AROM Comments  Elbow AROM WNL, painful lumbar extension      Strength   Overall Strength Comments  Grossly 5/5 MMT LEs and wrist/elbow, abdominal strength 4/5 MMT       Flexibility   Soft Tissue Assessment /Muscle Length  yes    Hamstrings  WNL      Palpation   Palpation comment  tenderness along lumbar paraspinals, gluteals             Objective measurements completed on examination: See above findings.              PT Education - 08/01/17 1419     Education provided  Yes    Education Details  eval findings/POC; recommendations for specific exercises that pt should avoid in her pilates routine; role of PT in providing both exercises and other techniques to improve pain but also provide pt with tools to prevent its return in the future    Person(s) Educated  Patient    Methods  Explanation    Comprehension  Verbalized understanding       PT Short Term Goals - 08/01/17 1335      PT SHORT TERM GOAL #1   Title  Pt will demo consistency and independent in initial HEP.    Time  4    Period  Weeks    Status  New    Target Date  08/29/17      PT SHORT TERM GOAL #2   Title  Pt will report a 30% reduction in the frequency and intensity of Rt elbow pain with ADLs and work tasks    Time  4    Period  Weeks    Status  New      PT SHORT TERM GOAL #3   Title  Pt will demo proper log roll technique with getting on/off the mat table during her sessions, without cues from therapist, in order to decrease strain on her low back.     Time  4    Period  Weeks    Status  New      PT SHORT TERM GOAL #4   Title  Pt will report being able to stand/walk for 20 minutes before LBP increases, which will increase her activity out in the community.     Time  4    Period  Weeks    Status  New        PT Long Term Goals - 08/01/17 1335      PT LONG TERM GOAL #1   Title  Pt will demo consistency  and independent in advanced HEP, to allow for prevention of symptoms at discharge.     Time  8    Period  Weeks    Status  New      PT LONG TERM GOAL #2   Title  Pt's FOTO score will decrease to < or = 34% limitation, to reflect and improvement in daily activity and low back pain impairment.    Time  8    Period  Weeks    Status  New      PT LONG TERM GOAL #3   Title  Pt will report atleast a 70% reduction in low back pain with home and work tasks to improve her quality of life and participation in her pilates classes.    Time  8    Period  Weeks     Status  New      PT LONG TERM GOAL #4   Title  Pt will demo improved trunk and hip stability, evident by her ability to maintain single leg stance on each LE for atleast 15 sec without trunk rotation.     Time  8    Period  Weeks    Status  New      PT LONG TERM GOAL #5   Title  Pt will demonstrate and verbalize correct body mechanics with ADLs and lifting in order to protect lumbar spine with daily activity.    Time  8    Period  Weeks    Status  New             Plan - 08/01/17 1421    Clinical Impression Statement  Pt is a 51 y.o F referred to OPPT with complaints of chronic LBP and Rt elbow/forearm pain. She received PT a little over 6 months ago with some improvement, however she has been inconsistent with her HEP since then and her pain has worsened. Pt demonstrates lumbar AROM that is WNL, however there is some discomfort with end range extension. She does have palpable muscle spasm along the lumbar paraspinals. Pt's elbow reveals normal AROM, pain free resistance testing and point tenderness on the proximal elbow only. Grip strength is normal and pain free as well. Currently, the pt's primary limitations include trunk weakness and lack of endurance in addition to poor hip/trunk stability evident during weight bearing activity such as single leg stance and single leg mini squat. Therapist discussed eval findings with the pt and reviewed specific exercises in her Pilates routine that she should hold off on for now. Pt would benefit from skilled PT to address her limitations in strength, stability and decrease pain with daily activity at home and work.     History and Personal Factors relevant to plan of care:  improvements with PT in the past     Clinical Presentation  Unstable    Clinical Presentation due to:  unable to reproduce Rt elbow pain/back pain during session    Clinical Decision Making  Low    Rehab Potential  Fair    Clinical Impairments Affecting Rehab Potential  (+) pt  age and some improvement in the past with PT, (-) pt lack of understanding of importance of HEP adherence     PT Frequency  2x / week    PT Duration  8 weeks    PT Treatment/Interventions  ADLs/Self Care Home Management;Cryotherapy;Electrical Stimulation;Moist Heat;Iontophoresis 4mg /ml Dexamethasone;Functional mobility training;Therapeutic activities;Therapeutic exercise;Patient/family education;Neuromuscular re-education;Manual techniques;Taping;Dry needling;Passive range of motion    PT Next Visit  Plan  review clams/pushups/other pilates exercises on home routine; dry needling lumbar paraspinals; progression of trunk stability     PT Home Exercise Plan  next session    Consulted and Agree with Plan of Care  Patient       Patient will benefit from skilled therapeutic intervention in order to improve the following deficits and impairments:  Decreased activity tolerance, Hypomobility, Decreased strength, Postural dysfunction, Increased muscle spasms, Improper body mechanics, Pain  Visit Diagnosis: Chronic bilateral low back pain, with sciatica presence unspecified  Pain in right elbow  Abnormal posture  Muscle spasm of back     Problem List Patient Active Problem List   Diagnosis Date Noted  . Low back pain 10/25/2010    2:41 PM,08/01/17 Elly Modena PT, DPT [place] [facility] [address], [place], [place], [address] Phone: [phone]   Fax:  [phone]  Name: ALLISSA ALBRIGHT MRN: [id number] Date of Birth: September 16, 1966

## 2017-08-07 ENCOUNTER — Ambulatory Visit: Payer: BLUE CROSS/BLUE SHIELD | Admitting: Physical Therapy

## 2017-08-07 DIAGNOSIS — G8929 Other chronic pain: Secondary | ICD-10-CM | POA: Diagnosis not present

## 2017-08-07 DIAGNOSIS — M545 Low back pain: Principal | ICD-10-CM

## 2017-08-07 DIAGNOSIS — M62830 Muscle spasm of back: Secondary | ICD-10-CM | POA: Diagnosis not present

## 2017-08-07 DIAGNOSIS — R293 Abnormal posture: Secondary | ICD-10-CM | POA: Diagnosis not present

## 2017-08-07 DIAGNOSIS — M25521 Pain in right elbow: Secondary | ICD-10-CM

## 2017-08-07 NOTE — Therapy (Signed)
[facility] [address] [place], [place], [address] Phone: [phone]   Fax:  [phone]  Physical Therapy Treatment  Patient Details  Name: Barbara Oconnor MRN: [id number] Date of Birth: 1966-02-15 Referring Provider: Erskine Emery, [facility]   Encounter Date: 08/07/2017  PT End of Session - 08/07/17 4270    Visit Number  2    Number of Visits  16    Date for PT Re-Evaluation  09/26/17    Authorization Type  BCBS    Authorization Time Period  11/8/1/ to 09/26/16    PT Start Time  1145    PT Stop Time  1230    PT Time Calculation (min)  45 min    Activity Tolerance  Patient tolerated treatment well    Behavior During Therapy  [facility] for tasks assessed/performed       Past Medical History:  Diagnosis Date  . Complication of anesthesia    slow awakening  . Goiter 2002   u/s 2002, 2010 (Dr. Buddy Duty)  . H/O motion sickness   . Low back pain 10/2010  . Multiple thyroid nodules   . Rectal bleeding    anal fissure    Past Surgical History:  Procedure Laterality Date  . COLONOSCOPY  03/2010   Jule Ser); benign polyp, told to repeat 10 years  . hemorrhoid banding    . TEAR DUCT PROBING  2006   left  . TONSILLECTOMY  1986    There were no vitals filed for this visit.  Subjective Assessment - 08/07/17 1149    Subjective  Pt reports that things are still the same. She has made some adjustments in her pilates routine. She would like to know ways that she can adjust her routine throughout the day.     Pertinent History  low back pain     Patient Stated Goals  improve low back pain, improve elbow pain    Currently in Pain?  No/denies    Pain Onset  More than a month ago              [facility] Adult PT Treatment/Exercise - 08/07/17 0001      Exercises   Exercises  Lumbar      Lumbar Exercises: Sidelying   Clam  5 reps    Clam Limitations  without resistance, therapist providing feedback for proper  activation      Lumbar Exercises: Quadruped   Opposite Arm/Leg Raise  Right arm/Left leg;Left arm/Right leg;10 reps      Manual Therapy   Manual Therapy  Soft tissue mobilization    Soft tissue mobilization  STM Rt lumbar paraspinals/QL/glute max and med; TPR Rt piriformis in conjunction with contract/relax/stretch              PT Education - 08/07/17 1237    Education provided  Yes    Education Details  importance of maintaining neutral spine vs focusing on extension/flexion activation during multisegment movements; possibility of sorenesss following techniques completed during session    Person(s) Educated  Patient    Methods  Explanation;Demonstration;Handout;Verbal cues    Comprehension  Verbalized understanding;Returned demonstration       PT Short Term Goals - 08/01/17 1335      PT SHORT TERM GOAL #1   Title  Pt will demo consistency and independent in initial HEP.    Time  4    Period  Weeks    Status  New    Target Date  08/29/17      PT SHORT TERM GOAL #2   Title  Pt will report a 30% reduction in the frequency and intensity of Rt elbow pain with ADLs and work tasks    Time  4    Period  Weeks    Status  New      PT SHORT TERM GOAL #3   Title  Pt will demo proper log roll technique with getting on/off the mat table during her sessions, without cues from therapist, in order to decrease strain on her low back.     Time  4    Period  Weeks    Status  New      PT SHORT TERM GOAL #4   Title  Pt will report being able to stand/walk for 20 minutes before LBP increases, which will increase her activity out in the community.     Time  4    Period  Weeks    Status  New        PT Long Term Goals - 08/01/17 1335      PT LONG TERM GOAL #1   Title  Pt will demo consistency and independent in advanced HEP, to allow for prevention of symptoms at discharge.     Time  8    Period  Weeks    Status  New      PT LONG TERM GOAL #2   Title  Pt's FOTO score will  decrease to < or = 34% limitation, to reflect and improvement in daily activity and low back pain impairment.    Time  8    Period  Weeks    Status  New      PT LONG TERM GOAL #3   Title  Pt will report atleast a 70% reduction in low back pain with home and work tasks to improve her quality of life and participation in her pilates classes.    Time  8    Period  Weeks    Status  New      PT LONG TERM GOAL #4   Title  Pt will demo improved trunk and hip stability, evident by her ability to maintain single leg stance on each LE for atleast 15 sec without trunk rotation.     Time  8    Period  Weeks    Status  New      PT LONG TERM GOAL #5   Title  Pt will demonstrate and verbalize correct body mechanics with ADLs and lifting in order to protect lumbar spine with daily activity.    Time  8    Period  Weeks    Status  New            Plan - 08/07/17 1247    Clinical Impression Statement  Pt arrived today with reports of no change in her pain from the evaluation, which is to be expected considering this is her first treatment. Although there has been no change in overall symptoms, pt does not report any pain currently. Therapist addressed pt concerns and questions regarding some topics covered at her evaluation and an HEP was implemented to introduce deep abdominal activation in various positions. Ended session with soft tissue mobilization to address muscle spasm and tenderness along the Rt gluteals, lumbar paraspinals and quadratus, pt reporting no pain during or following these techniques.     Rehab Potential  Fair    Clinical Impairments Affecting Rehab Potential  (+) pt  age and some improvement in the past with PT, (-) pt lack of understanding of importance of HEP adherence     PT Frequency  2x / week    PT Duration  8 weeks    PT Treatment/Interventions  ADLs/Self Care Home Management;Cryotherapy;Electrical Stimulation;Moist Heat;Iontophoresis 4mg /ml Dexamethasone;Functional  mobility training;Therapeutic activities;Therapeutic exercise;Patient/family education;Neuromuscular re-education;Manual techniques;Taping;Dry needling;Passive range of motion    PT Next Visit Plan  review technique with clams and quadruped (add resistance to both if able); dry needling lumbar paraspinals; progression of trunk stability     PT Home Exercise Plan  quadruped alt UE/LE reach; sidelying clams with red band     Consulted and Agree with Plan of Care  Patient       Patient will benefit from skilled therapeutic intervention in order to improve the following deficits and impairments:  Decreased activity tolerance, Hypomobility, Decreased strength, Postural dysfunction, Increased muscle spasms, Improper body mechanics, Pain  Visit Diagnosis: Chronic bilateral low back pain, with sciatica presence unspecified  Pain in right elbow  Abnormal posture  Muscle spasm of back     Problem List Patient Active Problem List   Diagnosis Date Noted  . Low back pain 10/25/2010    1:44 PM,08/07/17 Elly Modena PT, DPT [place] [facility] [address], [place], [place], [address] Phone: [phone]   Fax:  [phone]  Name: Barbara Oconnor MRN: [id number] Date of Birth: 10/20/1965

## 2017-08-07 NOTE — Patient Instructions (Signed)
   Bird Dog / Quadruped Alternating UEs/LEs  Start on hands and knees with a flat back and a tucked chin.  Extend one leg while raising the opposite arm.  Don't let your back arch or your hips twist... tight abdominals!  Switch from side to side and keep your movements slow and controlled.      hold 5 sec, repeat 15x on each side.      Clamshell with theraband  Place theraband above the knee and lie on your side. Spread legs and make sure not to roll backwards.   **Its all about form...can lie against wall or sofa to prevent rolling**    using red band around knees, 10 reps in a range that you can maintain stable core.     [facility] [address] [place], [place] [address] Phone # [phone] Fax [phone]

## 2017-08-12 ENCOUNTER — Telehealth: Payer: Self-pay | Admitting: Physical Therapy

## 2017-08-12 ENCOUNTER — Ambulatory Visit: Payer: BLUE CROSS/BLUE SHIELD | Admitting: Physical Therapy

## 2017-08-12 NOTE — Telephone Encounter (Signed)
No show. Called pt who states her alarm never went off and she forgot about the appointment. She confirmed her next appointment on 08/14/17.  12:57 PM,08/12/17 Barbara Oconnor PT, [place] [facility] [place]

## 2017-08-14 ENCOUNTER — Ambulatory Visit: Payer: BLUE CROSS/BLUE SHIELD | Admitting: Physical Therapy

## 2017-08-14 DIAGNOSIS — G8929 Other chronic pain: Secondary | ICD-10-CM

## 2017-08-14 DIAGNOSIS — M62830 Muscle spasm of back: Secondary | ICD-10-CM | POA: Diagnosis not present

## 2017-08-14 DIAGNOSIS — M25521 Pain in right elbow: Secondary | ICD-10-CM | POA: Diagnosis not present

## 2017-08-14 DIAGNOSIS — M545 Low back pain: Secondary | ICD-10-CM

## 2017-08-14 DIAGNOSIS — R293 Abnormal posture: Secondary | ICD-10-CM

## 2017-08-14 NOTE — Patient Instructions (Signed)
   Elbow Lateral Glide Mobilization  Lean against a door frame with your elbow padded by a towel.  Ensure the forearm is free to move.  Place your other hand on the forearm just below the elbow joint and push out.  Then make a fist with your hand multiple times.  This exercise should be pain free. 3 sets, 10 reps      WRIST EXTENSOR STRETCH  Use your unaffected hand to bend the affected wrist down as shown.   Keep the elbow straight on the affected side the entire time.    hold 20 sec, repeat 2x  twice a day    [facility] [address] [place], [place] [address] Phone # [phone] Fax [phone]

## 2017-08-14 NOTE — Therapy (Signed)
[facility] [address] [place], [place], [address] Phone: [phone]   Fax:  [phone]  Physical Therapy Treatment  Patient Details  Name: Barbara Oconnor MRN: [id number] Date of Birth: May 11, 1966 Referring Provider: Erskine Emery, [facility]   Encounter Date: 08/14/2017  PT End of Session - 08/14/17 1320    Visit Number  3    Number of Visits  16    Date for PT Re-Evaluation  09/26/17    Authorization Type  BC[employer]    Authorization Time Period  11/8/1/ to 09/26/16    PT Start Time  1227    PT Stop Time  1315    PT Time Calculation (min)  48 min    Activity Tolerance  Patient tolerated treatment well;No increased pain    Behavior During Therapy  WFL for tasks assessed/performed       Past Medical History:  Diagnosis Date  . Complication of anesthesia    slow awakening  . Goiter 2002   u/s 2002, 2010 (Dr. Buddy Duty)  . H/O motion sickness   . Low back pain 10/2010  . Multiple thyroid nodules   . Rectal bleeding    anal fissure    Past Surgical History:  Procedure Laterality Date  . COLONOSCOPY  03/2010   Jule Ser); benign polyp, told to repeat 10 years  . EXAMINATION UNDER ANESTHESIA N/A 11/04/2014   Procedure: EXAM UNDER ANESTHESIA;  Surgeon: Leighton Ruff, MD;  Location: [facility];  Service: General;  Laterality: N/A;  . hemorrhoid banding    . SPHINCTEROTOMY N/A 11/04/2014   Procedure: CHEMICAL SPHINCTEROTOMY WITH BOTOX/ fisurectomy;  Surgeon: Leighton Ruff, MD;  Location: [facility];  Service: General;  Laterality: N/A;  . TEAR DUCT PROBING  2006   left  . TONSILLECTOMY  1986    There were no vitals filed for this visit.  Subjective Assessment - 08/14/17 1227    Subjective  Pt reports that her elbow has been getting worse. She was drinking her coffee the other day and noticed increase in elbow pain. She will touch it to table tops and walls and get an "electric jerk".  She is not able to prop on her elbow during Pilates either.     Pertinent History  low back pain     Patient Stated Goals  improve low back pain, improve elbow pain    Currently in Pain?  No/denies no pain with rest, every now and then will have aches over the     Pain Onset  More than a month ago                      [facility] Adult PT Treatment/Exercise - 08/14/17 0001      Exercises   Exercises  Elbow      Elbow Exercises   Other elbow exercises  Lt elbow lateral glide with hand squeeze x10 reps     Other elbow exercises  Rt wrist extensor stretch elbow extended and bent x20 sec each       Modalities   Modalities  Cryotherapy      Cryotherapy   Number Minutes Cryotherapy  10 Minutes    Cryotherapy Location  Forearm Rt    Type of Cryotherapy  Ice pack      Manual Therapy   Manual Therapy  Joint mobilization    Joint Mobilization  Medial ulnar glide, grade III     Soft tissue mobilization  STM Rt wrist extensors; Rt tricep              PT Education - 08/14/17 1324    Education provided  Yes    Education Details  educated pt on the importance of focusing on proper abdominal activation; difference between extension and flexion based therex completed in her pilates class vs therex we are having her complete in therapy to address control with hip/trunk dissociation; technique set up and completion of HEP    Person(s) Educated  Patient    Methods  Explanation;Demonstration;Handout;Verbal cues    Comprehension  Returned demonstration;Verbalized understanding       PT Short Term Goals - 08/01/17 1335      PT SHORT TERM GOAL #1   Title  Pt will demo consistency and independent in initial HEP.    Time  4    Period  Weeks    Status  New    Target Date  08/29/17      PT SHORT TERM GOAL #2   Title  Pt will report a 30% reduction in the frequency and intensity of Rt elbow pain with ADLs and work tasks    Time  4    Period  Weeks    Status  New      PT SHORT  TERM GOAL #3   Title  Pt will demo proper log roll technique with getting on/off the mat table during her sessions, without cues from therapist, in order to decrease strain on her low back.     Time  4    Period  Weeks    Status  New      PT SHORT TERM GOAL #4   Title  Pt will report being able to stand/walk for 20 minutes before LBP increases, which will increase her activity out in the community.     Time  4    Period  Weeks    Status  New        PT Long Term Goals - 08/01/17 1335      PT LONG TERM GOAL #1   Title  Pt will demo consistency and independent in advanced HEP, to allow for prevention of symptoms at discharge.     Time  8    Period  Weeks    Status  New      PT LONG TERM GOAL #2   Title  Pt's FOTO score will decrease to < or = 34% limitation, to reflect and improvement in daily activity and low back pain impairment.    Time  8    Period  Weeks    Status  New      PT LONG TERM GOAL #3   Title  Pt will report atleast a 70% reduction in low back pain with home and work tasks to improve her quality of life and participation in her pilates classes.    Time  8    Period  Weeks    Status  New      PT LONG TERM GOAL #4   Title  Pt will demo improved trunk and hip stability, evident by her ability to maintain single leg stance on each LE for atleast 15 sec without trunk rotation.     Time  8    Period  Weeks    Status  New      PT LONG TERM GOAL #5   Title  Pt will demonstrate and verbalize correct body mechanics with ADLs and lifting  in order to protect lumbar spine with daily activity.    Time  8    Period  Weeks    Status  New            Plan - 08/14/17 1318    Clinical Impression Statement  Pt arrived today noting increase in Rt elbow pain with various activities throughout her day. No pain reported currently, however there is some noted tenderness along the wrist extensor bundle and triceps. Rt ULTT was negative for median/ulnar/radial nerve, although  we will consider ruling out the cervical spine if the symptoms remain unchanged with HEP updates and other techniques completed today. Pt is eager to make adjustments to her daily routine, however she does continue to require education and instruction regarding her Pilates exercises in order to improve proper activation and motor control of the hip/trunk. Ended session with manual techniques to address muscle spasm of the RUE, pt reporting no pain during or following today's activities.     Rehab Potential  Fair    Clinical Impairments Affecting Rehab Potential  (+) pt age and some improvement in the past with PT, (-) pt lack of understanding of importance of HEP adherence     PT Frequency  2x / week    PT Duration  8 weeks    PT Treatment/Interventions  ADLs/Self Care Home Management;Cryotherapy;Electrical Stimulation;Moist Heat;Iontophoresis 4mg /ml Dexamethasone;Functional mobility training;Therapeutic activities;Therapeutic exercise;Patient/family education;Neuromuscular re-education;Manual techniques;Taping;Dry needling;Passive range of motion    PT Next Visit Plan  ionto to elbow; rule out neck; provide standing stability work (tandem/NBOS/Single leg prop)    PT Home Exercise Plan  quadruped alt UE/LE reach; sidelying clams with green band     Consulted and Agree with Plan of Care  Patient       Patient will benefit from skilled therapeutic intervention in order to improve the following deficits and impairments:  Decreased activity tolerance, Hypomobility, Decreased strength, Postural dysfunction, Increased muscle spasms, Improper body mechanics, Pain  Visit Diagnosis: Pain in right elbow  Chronic bilateral low back pain, with sciatica presence unspecified  Muscle spasm of back  Abnormal posture  Chronic bilateral low back pain without sciatica     Problem List Patient Active Problem List   Diagnosis Date Noted  . Low back pain 10/25/2010    1:34 PM,08/14/17 Elly Modena PT,  [facility] [address] [place], [place], [address] Phone: [phone]   Fax:  [phone]  Name: Barbara Oconnor MRN: [id number] Date of Birth: 07-25-1966

## 2017-08-20 ENCOUNTER — Ambulatory Visit: Payer: BLUE CROSS/BLUE SHIELD | Admitting: Physical Therapy

## 2017-08-20 DIAGNOSIS — M25521 Pain in right elbow: Secondary | ICD-10-CM | POA: Diagnosis not present

## 2017-08-20 DIAGNOSIS — G8929 Other chronic pain: Secondary | ICD-10-CM

## 2017-08-20 DIAGNOSIS — M62830 Muscle spasm of back: Secondary | ICD-10-CM

## 2017-08-20 DIAGNOSIS — R293 Abnormal posture: Secondary | ICD-10-CM

## 2017-08-20 DIAGNOSIS — M545 Low back pain: Secondary | ICD-10-CM

## 2017-08-20 NOTE — Patient Instructions (Signed)
  Incline UE march           Lean on inclined surface (table, couch etc) with your arms extended. Maintain a plank position with your core tight and lumbar spine in neutral position. Start with your feet shoulder width apart. Slowly lift one arm up and hold for three seconds. Do not allow your weight to shift to the opposite side and do not allow your hips to twist. If you can not, bring your feet further apart. x10 reps, working up to 20 reps    Tandem press (Picture), with green band press x15 reps each direction with each foot forward.    [facility] [address] [place], [place] [address] Phone # [phone] Fax [phone]

## 2017-08-20 NOTE — Therapy (Signed)
[facility] [address], [place], [place], [address] Phone: [phone]   Fax:  [phone]  Physical Therapy Treatment  Patient Details  Name: Barbara Oconnor MRN: [id number] Date of Birth: 1966-01-26 Referring Provider: Erskine Emery, [facility]   Encounter Date: 08/20/2017  PT End of Session - 08/20/17 1231    Visit Number  4    Number of Visits  16    Date for PT Re-Evaluation  09/26/17    Authorization Type  BCBS    Authorization Time Period  11/8/1/ to 09/26/16    PT Start Time  1346    PT Stop Time  1430    PT Time Calculation (min)  44 min    Activity Tolerance  Patient tolerated treatment well;No increased pain    Behavior During Therapy  WFL for tasks assessed/performed       Past Medical History:  Diagnosis Date  . Complication of anesthesia    slow awakening  . Goiter 2002   u/s 2002, 2010 (Dr. Buddy Duty)  . H/O motion sickness   . Low back pain 10/2010  . Multiple thyroid nodules   . Rectal bleeding    anal fissure    Past Surgical History:  Procedure Laterality Date  . COLONOSCOPY  03/2010   Jule Ser); benign polyp, told to repeat 10 years  . EXAMINATION UNDER ANESTHESIA N/A 11/04/2014   Procedure: EXAM UNDER ANESTHESIA;  Surgeon: Leighton Ruff, MD;  Location: [facility];  Service: General;  Laterality: N/A;  . hemorrhoid banding    . SPHINCTEROTOMY N/A 11/04/2014   Procedure: CHEMICAL SPHINCTEROTOMY WITH BOTOX/ fisurectomy;  Surgeon: Leighton Ruff, MD;  Location: [place];  Service: General;  Laterality: N/A;  . TEAR DUCT PROBING  2006   left  . TONSILLECTOMY  1986    There were no vitals filed for this visit.  Subjective Assessment - 08/20/17 1148    Subjective  Pt reports that things are going well. She states that when her kids came home, her elbow started bothering her more. She is currently wearing an elbow brace which she thinks will help with her  awareness of her elbow.     Pertinent History  low back pain     Patient Stated Goals  improve low back pain, improve elbow pain    Currently in Pain?  No/denies    Pain Onset  More than a month ago                      [facility] Adult PT Treatment/Exercise - 08/20/17 0001      Lumbar Exercises: Standing   Other Standing Lumbar Exercises  tandem pallof press with green TB; Rt lateral ulnar glide with hand squeeze x10 reps (HEP review)    Other Standing Lumbar Exercises  closed chain adductor slide x10 reps each       Lumbar Exercises: Sidelying   Clam  10 reps    Clam Limitations  no resistance, HEP review       Lumbar Exercises: Quadruped   Single Arm Raise  5 reps;Left;Right;Limitations    Single Arm Raises Limitations  Pt unable to complete on toes without significant difficulty     Plank  incline plank on hands with alt UE reach x15 reps each              PT Education - 08/20/17 1254    Education provided  Yes    Education  Details  reviewed technique with HEP; educated pt on the difference between stability with therex and lack of strength/mobility; demonstrated progressions of therex and ways to incorporate more UE/LE involvement     Person(s) Educated  Patient    Methods  Explanation;Verbal cues;Tactile cues;Demonstration;Handout    Comprehension  Verbalized understanding;Returned demonstration       PT Short Term Goals - 08/20/17 1303      PT SHORT TERM GOAL #1   Title  Pt will demo consistency and independent in initial HEP.    Time  4    Period  Weeks    Status  Achieved      PT SHORT TERM GOAL #2   Title  Pt will report a 30% reduction in the frequency and intensity of Rt elbow pain with ADLs and work tasks    Time  4    Period  Weeks    Status  On-going      PT SHORT TERM GOAL #3   Title  Pt will demo proper log roll technique with getting on/off the mat table during her sessions, without cues from therapist, in order to decrease strain on  her low back.     Time  4    Period  Weeks    Status  Achieved      PT SHORT TERM GOAL #4   Title  Pt will report being able to stand/walk for 20 minutes before LBP increases, which will increase her activity out in the community.     Time  4    Period  Weeks    Status  On-going        PT Long Term Goals - 08/01/17 1335      PT LONG TERM GOAL #1   Title  Pt will demo consistency and independent in advanced HEP, to allow for prevention of symptoms at discharge.     Time  8    Period  Weeks    Status  New      PT LONG TERM GOAL #2   Title  Pt's FOTO score will decrease to < or = 34% limitation, to reflect and improvement in daily activity and low back pain impairment.    Time  8    Period  Weeks    Status  New      PT LONG TERM GOAL #3   Title  Pt will report atleast a 70% reduction in low back pain with home and work tasks to improve her quality of life and participation in her pilates classes.    Time  8    Period  Weeks    Status  New      PT LONG TERM GOAL #4   Title  Pt will demo improved trunk and hip stability, evident by her ability to maintain single leg stance on each LE for atleast 15 sec without trunk rotation.     Time  8    Period  Weeks    Status  New      PT LONG TERM GOAL #5   Title  Pt will demonstrate and verbalize correct body mechanics with ADLs and lifting in order to protect lumbar spine with daily activity.    Time  8    Period  Weeks    Status  New            Plan - 08/20/17 1232    Clinical Impression Statement  Pt continues to report issues with elbow and  low back throughout the day. She does not note any increase in her symptoms with completion of the manual and therex treatments at her last session and does not describe and pain upon arrival. Session focused on review of several HEP additions made last session and continued with therex to improve pt's neuromuscular control and endurance of the hip/trunk. Pt did demonstrate increased  difficulty with plank reaches, however she was able to proper activate her deep abdominals with therapist cuing. Ended session without any report of increased pain. Will continue with current POC.     Rehab Potential  Fair    Clinical Impairments Affecting Rehab Potential  (+) pt age and some improvement in the past with PT, (-) pt lack of understanding of importance of HEP adherence     PT Frequency  2x / week    PT Duration  8 weeks    PT Treatment/Interventions  ADLs/Self Care Home Management;Cryotherapy;Electrical Stimulation;Moist Heat;Iontophoresis 4mg /ml Dexamethasone;Functional mobility training;Therapeutic activities;Therapeutic exercise;Patient/family education;Neuromuscular re-education;Manual techniques;Taping;Dry needling;Passive range of motion    PT Next Visit Plan  if needed ionto to elbow; rule out neck; continue to work on plank progressions and standing stability     PT Home Exercise Plan  plank incline with UE reach; sidelying clams with green band, tandem pallof press     Consulted and Agree with Plan of Care  Patient       Patient will benefit from skilled therapeutic intervention in order to improve the following deficits and impairments:  Decreased activity tolerance, Hypomobility, Decreased strength, Postural dysfunction, Increased muscle spasms, Improper body mechanics, Pain  Visit Diagnosis: Pain in right elbow  Chronic bilateral low back pain, with sciatica presence unspecified  Muscle spasm of back  Abnormal posture     Problem List Patient Active Problem List   Diagnosis Date Noted  . Low back pain 10/25/2010    1:11 PM,08/20/17 Elly Modena PT, DPT [facility] [address] [place], [place], [address] Phone: [phone]   Fax:  [phone]  Name: Barbara Oconnor MRN: [id number] Date of Birth: 1966-07-18

## 2017-08-23 ENCOUNTER — Encounter: Payer: BLUE CROSS/BLUE SHIELD | Admitting: Physical Therapy

## 2017-08-26 ENCOUNTER — Ambulatory Visit: Payer: BLUE CROSS/BLUE SHIELD | Attending: Orthopaedic Surgery | Admitting: Physical Therapy

## 2017-08-26 DIAGNOSIS — M545 Low back pain: Secondary | ICD-10-CM | POA: Diagnosis not present

## 2017-08-26 DIAGNOSIS — G8929 Other chronic pain: Secondary | ICD-10-CM

## 2017-08-26 DIAGNOSIS — M25521 Pain in right elbow: Secondary | ICD-10-CM | POA: Diagnosis not present

## 2017-08-26 DIAGNOSIS — R293 Abnormal posture: Secondary | ICD-10-CM | POA: Insufficient documentation

## 2017-08-26 DIAGNOSIS — M62830 Muscle spasm of back: Secondary | ICD-10-CM | POA: Diagnosis not present

## 2017-08-26 NOTE — Therapy (Signed)
[facility] [address], [place], [place], [address] Phone: [phone]   Fax:  [phone]  Physical Therapy Treatment  Patient Details  Name: Barbara Oconnor MRN: [id number] Date of Birth: 1966/01/15 Referring Provider: Erskine Emery, [facility]   Encounter Date: 08/26/2017  PT End of Session - 08/26/17 1135    Visit Number  5    Number of Visits  16    Date for PT Re-Evaluation  09/26/17    Authorization Type  BCBS    Authorization Time Period  11/8/1/ to 09/26/16    PT Start Time  1135    PT Stop Time  1220    PT Time Calculation (min)  45 min    Activity Tolerance  Patient tolerated treatment well;No increased pain    Behavior During Therapy  WFL for tasks assessed/performed       Past Medical History:  Diagnosis Date  . Complication of anesthesia    slow awakening  . Goiter 2002   u/s 2002, 2010 (Dr. Buddy Duty)  . H/O motion sickness   . Low back pain 10/2010  . Multiple thyroid nodules   . Rectal bleeding    anal fissure    Past Surgical History:  Procedure Laterality Date  . COLONOSCOPY  03/2010   Jule Ser); benign polyp, told to repeat 10 years  . EXAMINATION UNDER ANESTHESIA N/A 11/04/2014   Procedure: EXAM UNDER ANESTHESIA;  Surgeon: Leighton Ruff, MD;  Location: [facility];  Service: General;  Laterality: N/A;  . hemorrhoid banding    . SPHINCTEROTOMY N/A 11/04/2014   Procedure: CHEMICAL SPHINCTEROTOMY WITH BOTOX/ fisurectomy;  Surgeon: Leighton Ruff, MD;  Location: [place];  Service: General;  Laterality: N/A;  . TEAR DUCT PROBING  2006   left  . TONSILLECTOMY  1986    There were no vitals filed for this visit.  Subjective Assessment - 08/26/17 1137    Subjective  Pt reports that things are going well. She feels that her elbow is bothering her more overall. She is not completing some of the exercises with one hand instead with both. She notes that her pain is  never when she is doing something, she will notice it when she is sleeping and wakes her up and just general achiness. She says the pain will go away after she moves it or pushes on it in a way that is taking some of the pressure off of it. She denies any achiness currently.     Pertinent History  low back pain     Patient Stated Goals  improve low back pain, improve elbow pain    Currently in Pain?  No/denies    Pain Onset  More than a month ago         [facility] PT Assessment - 08/26/17 0001      AROM   Overall AROM Comments  Rt thoracic rotation 40 deg (mobility limitation), Lt stability and motor control deficit; cervical active ROM with top tier dysfunctional/non-painful    AROM Assessment Site  Shoulder    Right/Left Shoulder  Right    Right Shoulder Extension  20 Degrees mobility restriction    Right Shoulder Flexion  170 Degrees    Right Shoulder ABduction  170 Degrees    Right Shoulder Internal Rotation  40 Degrees mobility restriction in 90 deg abd    Right Shoulder External Rotation  85 Degrees stability and motor control deficits, shoulder at 90 deg  Palpation   Palpation comment  tenderness along Rt lateral wrist extensors                  [facility] Adult PT Treatment/Exercise - 08/26/17 0001      Lumbar Exercises: Seated   Other Seated Lumbar Exercises  Rt ulnar glide x5 reps       Lumbar Exercises: Sidelying   Other Sidelying Lumbar Exercises  thoracic Rt rotation stretch x10 reps, RLE on bolster to avoid compensations      Lumbar Exercises: Quadruped   Other Quadruped Lumbar Exercises  child's pose thoracic rotation Lt and Rt x5 reps each; Rt thoracic active assisted rotation x5 reps, Lt thoracic active rotation with yellow TB resistance x5 reps       Modalities   Modalities  Iontophoresis      Iontophoresis   Type of Iontophoresis  Dexamethasone    Location  Rt elbow    Dose  1 mL lot# [id number]    Time  6 hour patch       Manual Therapy   Soft  tissue mobilization  STM Rt tricep/wrist extensors             PT Education - 08/26/17 1226    Education provided  Yes    Education Details  Implications for top tier assessment and noted asymmetries between Lt/Rt UE and thoracic region; ulnar glides and wear times of iontophoresis patch     Person(s) Educated  Patient    Methods  Explanation;Verbal cues;Handout    Comprehension  Verbalized understanding;Returned demonstration       PT Short Term Goals - 08/20/17 1303      PT SHORT TERM GOAL #1   Title  Pt will demo consistency and independent in initial HEP.    Time  4    Period  Weeks    Status  Achieved      PT SHORT TERM GOAL #2   Title  Pt will report a 30% reduction in the frequency and intensity of Rt elbow pain with ADLs and work tasks    Time  4    Period  Weeks    Status  On-going      PT SHORT TERM GOAL #3   Title  Pt will demo proper log roll technique with getting on/off the mat table during her sessions, without cues from therapist, in order to decrease strain on her low back.     Time  4    Period  Weeks    Status  Achieved      PT SHORT TERM GOAL #4   Title  Pt will report being able to stand/walk for 20 minutes before LBP increases, which will increase her activity out in the community.     Time  4    Period  Weeks    Status  On-going        PT Long Term Goals - 08/01/17 1335      PT LONG TERM GOAL #1   Title  Pt will demo consistency and independent in advanced HEP, to allow for prevention of symptoms at discharge.     Time  8    Period  Weeks    Status  New      PT LONG TERM GOAL #2   Title  Pt's FOTO score will decrease to < or = 34% limitation, to reflect and improvement in daily activity and low back pain impairment.    Time  8  Period  Weeks    Status  New      PT LONG TERM GOAL #3   Title  Pt will report atleast a 70% reduction in low back pain with home and work tasks to improve her quality of life and participation in her  pilates classes.    Time  8    Period  Weeks    Status  New      PT LONG TERM GOAL #4   Title  Pt will demo improved trunk and hip stability, evident by her ability to maintain single leg stance on each LE for atleast 15 sec without trunk rotation.     Time  8    Period  Weeks    Status  New      PT LONG TERM GOAL #5   Title  Pt will demonstrate and verbalize correct body mechanics with ADLs and lifting in order to protect lumbar spine with daily activity.    Time  8    Period  Weeks    Status  New            Plan - 08/26/17 1231    Clinical Impression Statement  Pt arrived today with reported increase in Rt elbow pain over the past several days. Her symptoms continue to be sporadic and there is no reported way to decrease her pain when it is bothering her. Completed further assessment of pt's cervical/thoracic/lumbar and UE mobility/control, noting several areas of limitation and reproduced pt's elbow pain with prone shoulder active flexion/abduction motion only. There is notable instability and mobility restrictions on the RUE/thoracic region more so than the Lt. therapist educated pt on these resutls and benefits of addressing them in order to prevent worsening of symptoms in the future. Pt was able to complete therex and tolerated manual techniques without increase in pain reported.     Rehab Potential  Fair    Clinical Impairments Affecting Rehab Potential  (+) pt age and some improvement in the past with PT, (-) pt lack of understanding of importance of HEP adherence     PT Frequency  2x / week    PT Duration  8 weeks    PT Treatment/Interventions  ADLs/Self Care Home Management;Cryotherapy;Electrical Stimulation;Moist Heat;Iontophoresis 4mg /ml Dexamethasone;Functional mobility training;Therapeutic activities;Therapeutic exercise;Patient/family education;Neuromuscular re-education;Manual techniques;Taping;Dry needling;Passive range of motion    PT Next Visit Plan  f/u on ionto  and provide #2; lumbar breakouts; shoulder/thoracic/lumbar stability progressions    PT Home Exercise Plan  plank incline with UE reach; sidelying clams with green band, tandem pallof press     Consulted and Agree with Plan of Care  Patient       Patient will benefit from skilled therapeutic intervention in order to improve the following deficits and impairments:  Decreased activity tolerance, Hypomobility, Decreased strength, Postural dysfunction, Increased muscle spasms, Improper body mechanics, Pain  Visit Diagnosis: Pain in right elbow  Chronic bilateral low back pain, with sciatica presence unspecified  Muscle spasm of back  Abnormal posture     Problem List Patient Active Problem List   Diagnosis Date Noted  . Low back pain 10/25/2010   1:39 PM,08/26/17 Elly Modena PT, DPT [place] [facility] [address] [place], [place], [address] Phone: [phone]   Fax:  [phone]  Name: ETHAN KASPERSKI MRN: [id number] Date of Birth: [birth year]

## 2017-08-27 ENCOUNTER — Encounter: Payer: BLUE CROSS/BLUE SHIELD | Admitting: Physical Therapy

## 2017-08-29 ENCOUNTER — Ambulatory Visit: Payer: BLUE CROSS/BLUE SHIELD | Admitting: Physical Therapy

## 2017-08-29 DIAGNOSIS — M545 Low back pain: Secondary | ICD-10-CM

## 2017-08-29 DIAGNOSIS — M62830 Muscle spasm of back: Secondary | ICD-10-CM

## 2017-08-29 DIAGNOSIS — M25521 Pain in right elbow: Secondary | ICD-10-CM | POA: Diagnosis not present

## 2017-08-29 DIAGNOSIS — G8929 Other chronic pain: Secondary | ICD-10-CM

## 2017-08-29 DIAGNOSIS — R293 Abnormal posture: Secondary | ICD-10-CM | POA: Diagnosis not present

## 2017-08-29 NOTE — Patient Instructions (Signed)
    Exercise Ball chop/lift  Sitting on Stability Ball Maintain straight/proper trunk posture. Begin with medicine ball right/left hip as pictured. Reach over opposite shoulder with both arms while maintaining trunk control. Prevent any deviation of posture; no rotation or bending! Repeat as instructed for both sides.      [place] [facility] [address] [place], [place] [address] Phone # [phone] Fax [phone]

## 2017-08-29 NOTE — Therapy (Signed)
[facility] [address], [place], [place], [address] Phone: [phone]   Fax:  [phone]  Physical Therapy Treatment  Patient Details  Name: Barbara Oconnor MRN: [id number] Date of Birth: 29-Jan-1966 Referring Provider: Erskine Emery, [facility]   Encounter Date: 08/29/2017  PT End of Session - 08/29/17 1230    Visit Number  6    Number of Visits  16    Date for PT Re-Evaluation  09/26/17    Authorization Type  BCBS    Authorization Time Period  11/8/1/ to 09/26/16    PT Start Time  1146    PT Stop Time  1230    PT Time Calculation (min)  44 min    Activity Tolerance  Patient tolerated treatment well;No increased pain    Behavior During Therapy  WFL for tasks assessed/performed       Past Medical History:  Diagnosis Date  . Complication of anesthesia    slow awakening  . Goiter 2002   u/s 2002, 2010 (Dr. Buddy Duty)  . H/O motion sickness   . Low back pain 10/2010  . Multiple thyroid nodules   . Rectal bleeding    anal fissure    Past Surgical History:  Procedure Laterality Date  . COLONOSCOPY  03/2010   Jule Ser); benign polyp, told to repeat 10 years  . EXAMINATION UNDER ANESTHESIA N/A 11/04/2014   Procedure: EXAM UNDER ANESTHESIA;  Surgeon: Leighton Ruff, MD;  Location: [facility];  Service: General;  Laterality: N/A;  . hemorrhoid banding    . SPHINCTEROTOMY N/A 11/04/2014   Procedure: CHEMICAL SPHINCTEROTOMY WITH BOTOX/ fisurectomy;  Surgeon: Leighton Ruff, MD;  Location: [place];  Service: General;  Laterality: N/A;  . TEAR DUCT PROBING  2006   left  . TONSILLECTOMY  1986    There were no vitals filed for this visit.  Subjective Assessment - 08/29/17 1148    Subjective  Pt reports that things are going ok. She continues to work on her exercises as much as possible. She has been able to sleep through the night over the past couple of days without her elbow bothering  her. She says that the patch didn't bother her at all in the 6 hours while she was wearing.     Pertinent History  low back pain     Patient Stated Goals  improve low back pain, improve elbow pain    Currently in Pain?  No/denies    Pain Onset  More than a month ago                      [facility] Adult PT Treatment/Exercise - 08/29/17 0001      Lumbar Exercises: Standing   Other Standing Lumbar Exercises  BUE snow angel with yellow TB resistance x10 reps each     Other Standing Lumbar Exercises  BUE pressdown with yellow TB x15 reps each       Lumbar Exercises: Supine   Other Supine Lumbar Exercises  Rt shoulder IR with elbow by side, red TB x10 reps     Other Supine Lumbar Exercises  Lt and Rt chops with red TB x10 reps; lifts with red TB each direction x10 reps (Rt more difficult)       Lumbar Exercises: Sidelying   Other Sidelying Lumbar Exercises  thoracic rotation stretch x15 reps, LE on bolster to avoid compensations      Iontophoresis   Type of  Iontophoresis  Dexamethasone    Location  Rt elbow    Dose  1 mL lot #[id number]    Time  6 hour patch              PT Education - 08/29/17 1300    Education provided  Yes    Education Details  noted differences in pt's ability to complete thoracic activities; updates to [employer]) Educated  Patient    Methods  Explanation;Verbal cues;Handout    Comprehension  Verbalized understanding;Returned demonstration       PT Short Term Goals - 08/20/17 1303      PT SHORT TERM GOAL #1   Title  Pt will demo consistency and independent in initial HEP.    Time  4    Period  Weeks    Status  Achieved      PT SHORT TERM GOAL #2   Title  Pt will report a 30% reduction in the frequency and intensity of Rt elbow pain with ADLs and work tasks    Time  4    Period  Weeks    Status  On-going      PT SHORT TERM GOAL #3   Title  Pt will demo proper log roll technique with getting on/off the mat table during her sessions,  without cues from therapist, in order to decrease strain on her low back.     Time  4    Period  Weeks    Status  Achieved      PT SHORT TERM GOAL #4   Title  Pt will report being able to stand/walk for 20 minutes before LBP increases, which will increase her activity out in the community.     Time  4    Period  Weeks    Status  On-going        PT Long Term Goals - 08/01/17 1335      PT LONG TERM GOAL #1   Title  Pt will demo consistency and independent in advanced HEP, to allow for prevention of symptoms at discharge.     Time  8    Period  Weeks    Status  New      PT LONG TERM GOAL #2   Title  Pt's FOTO score will decrease to < or = 34% limitation, to reflect and improvement in daily activity and low back pain impairment.    Time  8    Period  Weeks    Status  New      PT LONG TERM GOAL #3   Title  Pt will report atleast a 70% reduction in low back pain with home and work tasks to improve her quality of life and participation in her pilates classes.    Time  8    Period  Weeks    Status  New      PT LONG TERM GOAL #4   Title  Pt will demo improved trunk and hip stability, evident by her ability to maintain single leg stance on each LE for atleast 15 sec without trunk rotation.     Time  8    Period  Weeks    Status  New      PT LONG TERM GOAL #5   Title  Pt will demonstrate and verbalize correct body mechanics with ADLs and lifting in order to protect lumbar spine with daily activity.    Time  8    Period  Weeks    Status  New            Plan - 08/29/17 1315    Clinical Impression Statement  Pt arrived today with much improved elbow pain, noting she was able to sleep through the night for the past several days. She demonstrates improved motor control of her thoracic extensors with lifts completed this session, however she does have asymmetries between Lt and Rt sides with this. Session was completed without reports of low back or Rt elbow irritation. Pt  verbalized good understanding of HEP updates made this visit. Will continue with current POC.     Rehab Potential  Fair    Clinical Impairments Affecting Rehab Potential  (+) pt age and some improvement in the past with PT, (-) pt lack of understanding of importance of HEP adherence     PT Frequency  2x / week    PT Duration  8 weeks    PT Treatment/Interventions  ADLs/Self Care Home Management;Cryotherapy;Electrical Stimulation;Moist Heat;Iontophoresis 4mg /ml Dexamethasone;Functional mobility training;Therapeutic activities;Therapeutic exercise;Patient/family education;Neuromuscular re-education;Manual techniques;Taping;Dry needling;Passive range of motion    PT Next Visit Plan  f/u on ionto and provide #3; lumbar breakouts; shoulder/thoracic/lumbar stability progressions (trial half kneel)    PT Home Exercise Plan  plank incline with UE reach; sidelying clams with green band, tandem pallof press, supine lifts     Consulted and Agree with Plan of Care  Patient       Patient will benefit from skilled therapeutic intervention in order to improve the following deficits and impairments:  Decreased activity tolerance, Hypomobility, Decreased strength, Postural dysfunction, Increased muscle spasms, Improper body mechanics, Pain  Visit Diagnosis: Pain in right elbow  Chronic bilateral low back pain, with sciatica presence unspecified  Muscle spasm of back  Abnormal posture     Problem List Patient Active Problem List   Diagnosis Date Noted  . Low back pain 10/25/2010   1:21 PM,08/29/17 Elly Modena PT, DPT [place] [facility] [address] [place], [place], [address] Phone: [phone]   Fax:  [phone]  Name: Barbara Oconnor MRN: [id number] Date of Birth: 1966/02/09

## 2017-09-03 ENCOUNTER — Encounter: Payer: BLUE CROSS/BLUE SHIELD | Admitting: Physical Therapy

## 2017-09-05 ENCOUNTER — Ambulatory Visit: Payer: BLUE CROSS/BLUE SHIELD | Admitting: Physical Therapy

## 2017-09-05 DIAGNOSIS — M545 Low back pain, unspecified: Secondary | ICD-10-CM

## 2017-09-05 DIAGNOSIS — M62830 Muscle spasm of back: Secondary | ICD-10-CM

## 2017-09-05 DIAGNOSIS — M25521 Pain in right elbow: Secondary | ICD-10-CM

## 2017-09-05 DIAGNOSIS — R293 Abnormal posture: Secondary | ICD-10-CM | POA: Diagnosis not present

## 2017-09-05 DIAGNOSIS — G8929 Other chronic pain: Secondary | ICD-10-CM | POA: Diagnosis not present

## 2017-09-05 NOTE — Therapy (Signed)
[facility] [address] [place], [place], [address] Phone: [phone]   Fax:  [phone]  Physical Therapy Treatment  Patient Details  Name: Barbara Oconnor MRN: [id number] Date of Birth: Dec 06, 1965 Referring Provider: Erskine Emery, [facility]   Encounter Date: 09/05/2017  PT End of Session - 09/05/17 1237    Visit Number  7    Number of Visits  16    Date for PT Re-Evaluation  09/26/17    Authorization Type  BCBS    Authorization Time Period  11/8/1/ to 09/26/16    PT Start Time  1149    PT Stop Time  1230    PT Time Calculation (min)  41 min    Activity Tolerance  Patient tolerated treatment well;No increased pain    Behavior During Therapy  WFL for tasks assessed/performed       Past Medical History:  Diagnosis Date  . Complication of anesthesia    slow awakening  . Goiter 2002   u/s 2002, 2010 (Dr. Buddy Duty)  . H/O motion sickness   . Low back pain 10/2010  . Multiple thyroid nodules   . Rectal bleeding    anal fissure    Past Surgical History:  Procedure Laterality Date  . COLONOSCOPY  03/2010   Jule Ser); benign polyp, told to repeat 10 years  . EXAMINATION UNDER ANESTHESIA N/A 11/04/2014   Procedure: EXAM UNDER ANESTHESIA;  Surgeon: Leighton Ruff, MD;  Location: [facility];  Service: General;  Laterality: N/A;  . hemorrhoid banding    . SPHINCTEROTOMY N/A 11/04/2014   Procedure: CHEMICAL SPHINCTEROTOMY WITH BOTOX/ fisurectomy;  Surgeon: Leighton Ruff, MD;  Location: [place];  Service: General;  Laterality: N/A;  . TEAR DUCT PROBING  2006   left  . TONSILLECTOMY  1986    There were no vitals filed for this visit.  Subjective Assessment - 09/05/17 1150    Subjective  Pt reports that things are going well. She says her elbow has been fair overall. She did alot of shoveling, and the back is not any worse. No pain the following. Right now, her low back is bothering  her some.     Pertinent History  low back pain     Patient Stated Goals  improve low back pain, improve elbow pain    Currently in Pain?  Yes    Pain Score  4     Pain Location  Back    Pain Orientation  Lower;Right more on the Rt     Pain Descriptors / Indicators  Aching    Pain Type  Chronic pain    Pain Radiating Towards  none     Pain Onset  More than a month ago    Pain Frequency  Constant    Aggravating Factors   alot of shoveling and prolonged standing.     Pain Relieving Factors  heat, movement and stretching     Effect of Pain on Daily Activities  moderate limitation                       [facility] Adult PT Treatment/Exercise - 09/05/17 0001      Exercises   Exercises  Other Exercises    Other Exercises   half kneel lifts each direction x15 reps, RLE forward only due to increase in Rt knee pain       Lumbar Exercises: Stretches   Hip Flexor Stretch  1  rep;30 seconds;Other (comment) supine thomas position       Lumbar Exercises: Standing   Other Standing Lumbar Exercises  lifts Lt and Rt with yellow TB x5-8 reps without cervical rotation, x10 reps with cervical rotation       Lumbar Exercises: Seated   Other Seated Lumbar Exercises  Rt radial nerve flossing 2x10 reps       Iontophoresis   Location  Rt elbow    Dose  46mL lot# [id number]    Time  6 hour patch       Manual Therapy   Joint Mobilization  MWM Rt elbow flexion 2x10 reps, MWM Rt elbow grip x10 reps              PT Education - 09/05/17 1237    Education provided  Yes    Education Details  importance of addressing limitations in hip extension ROM    Person(s) Educated  Patient    Methods  Explanation    Comprehension  Verbalized understanding       PT Short Term Goals - 08/20/17 1303      PT SHORT TERM GOAL #1   Title  Pt will demo consistency and independent in initial HEP.    Time  4    Period  Weeks    Status  Achieved      PT SHORT TERM GOAL #2   Title  Pt will report a 30%  reduction in the frequency and intensity of Rt elbow pain with ADLs and work tasks    Time  4    Period  Weeks    Status  On-going      PT SHORT TERM GOAL #3   Title  Pt will demo proper log roll technique with getting on/off the mat table during her sessions, without cues from therapist, in order to decrease strain on her low back.     Time  4    Period  Weeks    Status  Achieved      PT SHORT TERM GOAL #4   Title  Pt will report being able to stand/walk for 20 minutes before LBP increases, which will increase her activity out in the community.     Time  4    Period  Weeks    Status  On-going        PT Long Term Goals - 08/01/17 1335      PT LONG TERM GOAL #1   Title  Pt will demo consistency and independent in advanced HEP, to allow for prevention of symptoms at discharge.     Time  8    Period  Weeks    Status  New      PT LONG TERM GOAL #2   Title  Pt's FOTO score will decrease to < or = 34% limitation, to reflect and improvement in daily activity and low back pain impairment.    Time  8    Period  Weeks    Status  New      PT LONG TERM GOAL #3   Title  Pt will report atleast a 70% reduction in low back pain with home and work tasks to improve her quality of life and participation in her pilates classes.    Time  8    Period  Weeks    Status  New      PT LONG TERM GOAL #4   Title  Pt will demo improved trunk and hip stability, evident  by her ability to maintain single leg stance on each LE for atleast 15 sec without trunk rotation.     Time  8    Period  Weeks    Status  New      PT LONG TERM GOAL #5   Title  Pt will demonstrate and verbalize correct body mechanics with ADLs and lifting in order to protect lumbar spine with daily activity.    Time  8    Period  Weeks    Status  New            Plan - 09/05/17 1238    Clinical Impression Statement  Pt continues to report improving Rt elbow pain, noting no pain upon arrival. Session focused on progression  of therex to improve motor control of the trunk and UE, with pt able to complete in half kneel position compared to supine at previous sessions. Pt does require intermittent cuing to improve lumbar stabilization, and there is noted limitations in hip flexibility with extension primarily, which could be contributing to her pelvic position in standing. HEP was updated to further address this and pt reported no increase in pain following today's therex progressions.     Rehab Potential  Fair    Clinical Impairments Affecting Rehab Potential  (+) pt age and some improvement in the past with PT, (-) pt lack of understanding of importance of HEP adherence     PT Frequency  2x / week    PT Duration  8 weeks    PT Treatment/Interventions  ADLs/Self Care Home Management;Cryotherapy;Electrical Stimulation;Moist Heat;Iontophoresis 4mg /ml Dexamethasone;Functional mobility training;Therapeutic activities;Therapeutic exercise;Patient/family education;Neuromuscular re-education;Manual techniques;Taping;Dry needling;Passive range of motion    PT Next Visit Plan  ionto #4; lumbar breakouts; shoulder/thoracic/lumbar stability progressions (trial half kneel)    PT Home Exercise Plan  plank incline with UE reach; sidelying clams with green band, tandem pallof press, supine lifts, hip flexor stretch      Consulted and Agree with Plan of Care  Patient       Patient will benefit from skilled therapeutic intervention in order to improve the following deficits and impairments:  Decreased activity tolerance, Hypomobility, Decreased strength, Postural dysfunction, Increased muscle spasms, Improper body mechanics, Pain  Visit Diagnosis: Pain in right elbow  Chronic bilateral low back pain, with sciatica presence unspecified  Muscle spasm of back  Abnormal posture  Chronic bilateral low back pain without sciatica     Problem List Patient Active Problem List   Diagnosis Date Noted  . Low back pain 10/25/2010     12:53 PM,09/05/17 Elly Modena PT, DPT [facility] [address], [place], [place], [address] Phone: [phone]   Fax:  [phone]  Name: Barbara Oconnor MRN: [id number] Date of Birth: [birth year]

## 2017-09-05 NOTE — Patient Instructions (Signed)
   HIP FLEXOR STRETCH 2  While lying on a table or high bed, let the affected leg lower towards the floor until a stretch is felt along the front of your thigh.   At the same time, grasp your opposite knee and pull it towards your chest.  Hold for 30 sec, repeat atleast 2x each     [facility] [address] [place], [place] [address] Phone # [phone] Fax [phone]

## 2017-09-10 ENCOUNTER — Encounter: Payer: BLUE CROSS/BLUE SHIELD | Admitting: Physical Therapy

## 2017-09-12 ENCOUNTER — Ambulatory Visit: Payer: BLUE CROSS/BLUE SHIELD | Admitting: Physical Therapy

## 2017-09-12 DIAGNOSIS — G8929 Other chronic pain: Secondary | ICD-10-CM | POA: Diagnosis not present

## 2017-09-12 DIAGNOSIS — R293 Abnormal posture: Secondary | ICD-10-CM | POA: Diagnosis not present

## 2017-09-12 DIAGNOSIS — M25521 Pain in right elbow: Secondary | ICD-10-CM

## 2017-09-12 DIAGNOSIS — M545 Low back pain: Secondary | ICD-10-CM | POA: Diagnosis not present

## 2017-09-12 DIAGNOSIS — M62830 Muscle spasm of back: Secondary | ICD-10-CM

## 2017-09-12 NOTE — Therapy (Signed)
[facility] [address] [place], [place], [address] Phone: [phone]   Fax:  [phone]  Physical Therapy Treatment  Patient Details  Name: Barbara Oconnor MRN: [id number] Date of Birth: 1966/04/04 Referring Provider: Erskine Emery, [facility]   Encounter Date: 09/12/2017  PT End of Session - 09/12/17 1415    Visit Number  8    Number of Visits  16    Date for PT Re-Evaluation  09/26/17    Authorization Type  BCBS    Authorization Time Period  11/8/1/ to 09/26/16    PT Start Time  1401    PT Stop Time  1445    PT Time Calculation (min)  44 min    Activity Tolerance  Patient tolerated treatment well;No increased pain    Behavior During Therapy  WFL for tasks assessed/performed       Past Medical History:  Diagnosis Date  . Complication of anesthesia    slow awakening  . Goiter 2002   u/s 2002, 2010 (Dr. Buddy Duty)  . H/O motion sickness   . Low back pain 10/2010  . Multiple thyroid nodules   . Rectal bleeding    anal fissure    Past Surgical History:  Procedure Laterality Date  . COLONOSCOPY  03/2010   Jule Ser); benign polyp, told to repeat 10 years  . EXAMINATION UNDER ANESTHESIA N/A 11/04/2014   Procedure: EXAM UNDER ANESTHESIA;  Surgeon: Leighton Ruff, MD;  Location: [facility];  Service: General;  Laterality: N/A;  . hemorrhoid banding    . SPHINCTEROTOMY N/A 11/04/2014   Procedure: CHEMICAL SPHINCTEROTOMY WITH BOTOX/ fisurectomy;  Surgeon: Leighton Ruff, MD;  Location: [facility];  Service: General;  Laterality: N/A;  . TEAR DUCT PROBING  2006   left  . TONSILLECTOMY  1986    There were no vitals filed for this visit.  Subjective Assessment - 09/12/17 1404    Subjective  Pt reports that her knees bothered her the evening following her last session, but didn't note anything else after that. She did alot of traveling which made it difficult to complete her exercises.  Her elbow did have a spot/rash on it after removing her patch last round (~6 hours).      Pertinent History  low back pain     Patient Stated Goals  improve low back pain, improve elbow pain    Currently in Pain?  Yes    Pain Score  2     Pain Location  Back    Pain Orientation  Right;Lower    Pain Descriptors / Indicators  Aching    Pain Type  Chronic pain    Pain Radiating Towards  none     Pain Onset  More than a month ago    Pain Frequency  Constant    Aggravating Factors   alot of activity, prolonged standing     Pain Relieving Factors  heat and movement/stretching     Effect of Pain on Daily Activities  moderate limitation                       [facility] Adult PT Treatment/Exercise - 09/12/17 0001      Lumbar Exercises: Stretches   Hip Flexor Stretch  3 reps;30 seconds;Other (comment) thomas position       Lumbar Exercises: Supine   Other Supine Lumbar Exercises  log roll with red physioball press x15 reps Lt and Rt  Manual Therapy   Joint Mobilization  Grade III/IV CPAs L2-S2    Soft tissue mobilization  STM Rt gluteals/paraspinals/quadratus lumborum              PT Education - 09/12/17 1413    Education provided  Yes    Education Details  importance of completing HEP even intermittently over the busy holiday season; addition to HEP; upcoming re-evaluation and need to likely extend POC with decreased frequency to allow pt to become more independent with HEP    Person(s) Educated  Patient    Methods  Explanation    Comprehension  Verbalized understanding;Returned demonstration       PT Short Term Goals - 09/12/17 1411      PT SHORT TERM GOAL #1   Title  Pt will demo consistency and independent in initial HEP.    Time  4    Period  Weeks    Status  Achieved      PT SHORT TERM GOAL #2   Title  Pt will report a 30% reduction in the frequency and intensity of Rt elbow pain with ADLs and work tasks    Baseline  20% decrease,     Time  4     Period  Weeks    Status  Partially Met      PT SHORT TERM GOAL #3   Title  Pt will demo proper log roll technique with getting on/off the mat table during her sessions, without cues from therapist, in order to decrease strain on her low back.     Time  4    Period  Weeks    Status  Achieved      PT SHORT TERM GOAL #4   Title  Pt will report being able to stand/walk for 20 minutes before LBP increases, which will increase her activity out in the community.     Time  4    Period  Weeks    Status  On-going        PT Long Term Goals - 08/01/17 1335      PT LONG TERM GOAL #1   Title  Pt will demo consistency and independent in advanced HEP, to allow for prevention of symptoms at discharge.     Time  8    Period  Weeks    Status  New      PT LONG TERM GOAL #2   Title  Pt's FOTO score will decrease to < or = 34% limitation, to reflect and improvement in daily activity and low back pain impairment.    Time  8    Period  Weeks    Status  New      PT LONG TERM GOAL #3   Title  Pt will report atleast a 70% reduction in low back pain with home and work tasks to improve her quality of life and participation in her pilates classes.    Time  8    Period  Weeks    Status  New      PT LONG TERM GOAL #4   Title  Pt will demo improved trunk and hip stability, evident by her ability to maintain single leg stance on each LE for atleast 15 sec without trunk rotation.     Time  8    Period  Weeks    Status  New      PT LONG TERM GOAL #5   Title  Pt will demonstrate and verbalize correct  body mechanics with ADLs and lifting in order to protect lumbar spine with daily activity.    Time  8    Period  Weeks    Status  New            Plan - 09/12/17 1415    Clinical Impression Statement  Pt reports rash following her 3rd iontophoresis patch last session, so this was held for today. Overall, she reports that her elbow is nearly 30% improved since the start of therapy. She continues to  have low back pain and discomfort which is difficult to target specifically due to pt's inability to tolerate various treatment positions. Session focused more on deep abdominal strengthening and manual treatment to address soft tissue and joint restrictions in the low lumbar region/gluteals, however pt was unable to provide pain rating end of session to reflect on progress. She has re-evaluation coming up and will likely benefit from decrease in PT frequency to allow for more focus on independence and self-progression of therex at home.     Rehab Potential  Fair    Clinical Impairments Affecting Rehab Potential  (+) pt age and some improvement in the past with PT, (-) pt lack of understanding of importance of HEP adherence     PT Frequency  2x / week    PT Duration  8 weeks    PT Treatment/Interventions  ADLs/Self Care Home Management;Cryotherapy;Electrical Stimulation;Moist Heat;Iontophoresis 50m/ml Dexamethasone;Functional mobility training;Therapeutic activities;Therapeutic exercise;Patient/family education;Neuromuscular re-education;Manual techniques;Taping;Dry needling;Passive range of motion    PT Next Visit Plan  trial ionto #4 (only wear 4 hours if pt agreeable); re-assessment (FOTO/goals); if enough time, shoulder/thoracic/lumbar stability progressions (pt does not like half kneel, plank or quadruped position)    PT Home Exercise Plan  plank incline with UE reach; sidelying clams with green band, tandem pallof press, supine lifts, hip flexor stretch, shoulder extension    Consulted and Agree with Plan of Care  Patient       Patient will benefit from skilled therapeutic intervention in order to improve the following deficits and impairments:  Decreased activity tolerance, Hypomobility, Decreased strength, Postural dysfunction, Increased muscle spasms, Improper body mechanics, Pain  Visit Diagnosis: Pain in right elbow  Chronic bilateral low back pain, with sciatica presence  unspecified  Muscle spasm of back  Abnormal posture     Problem List Patient Active Problem List   Diagnosis Date Noted  . Low back pain 10/25/2010    3:06 PM,09/12/17 SElly ModenaPT, DPT [facility] [address] [place] [address]Phone: [phone]  Fax:  [phone] Name: Barbara BASISTAMRN: [id number] of Birth: 611-18-1967

## 2017-09-12 NOTE — Patient Instructions (Signed)
   ELASTIC BAND SCAPULAR RETRACTIONS WITH MINI SHOULDER EXTENSIONS  While holding an elastic band with both arms in front of you with your elbows straight, squeeze your shoulder blades together as you pull the band back. Be sure your shoulders do not raise up.  With green band, x15 reps. Keep abdomen tight and shoulders back   [facility] [address], [place], [place] [address] Phone # [phone] Fax [phone]

## 2017-09-18 ENCOUNTER — Ambulatory Visit: Payer: BLUE CROSS/BLUE SHIELD | Admitting: Physical Therapy

## 2017-09-18 DIAGNOSIS — R293 Abnormal posture: Secondary | ICD-10-CM

## 2017-09-18 DIAGNOSIS — M25521 Pain in right elbow: Secondary | ICD-10-CM

## 2017-09-18 DIAGNOSIS — M545 Low back pain: Secondary | ICD-10-CM

## 2017-09-18 DIAGNOSIS — G8929 Other chronic pain: Secondary | ICD-10-CM

## 2017-09-18 DIAGNOSIS — M62830 Muscle spasm of back: Secondary | ICD-10-CM | POA: Diagnosis not present

## 2017-09-18 NOTE — Therapy (Addendum)
[facility] [address], [place], [place], [address] Phone: [phone]   Fax:  [phone]  Physical Therapy Treatment/Discharge  Patient Details  Name: Barbara Oconnor MRN: [id number] Date of Birth: 1966/03/31 Referring Provider: Dr. Ninfa Linden    Encounter Date: 09/18/2017  PT End of Session - 09/18/17 1021    Visit Number  9    Number of Visits  16    Date for PT Re-Evaluation  09/26/17    PT Start Time  1020    PT Stop Time  1113    PT Time Calculation (min)  53 min       Past Medical History:  Diagnosis Date  . Complication of anesthesia    slow awakening  . Goiter 2002   u/s 2002, 2010 (Dr. Buddy Duty)  . H/O motion sickness   . Low back pain 10/2010  . Multiple thyroid nodules   . Rectal bleeding    anal fissure    Past Surgical History:  Procedure Laterality Date  . COLONOSCOPY  03/2010   Jule Ser); benign polyp, told to repeat 10 years  . EXAMINATION UNDER ANESTHESIA N/A 11/04/2014   Procedure: EXAM UNDER ANESTHESIA;  Surgeon: Leighton Ruff, MD;  Location: [facility];  Service: General;  Laterality: N/A;  . hemorrhoid banding    . SPHINCTEROTOMY N/A 11/04/2014   Procedure: CHEMICAL SPHINCTEROTOMY WITH BOTOX/ fisurectomy;  Surgeon: Leighton Ruff, MD;  Location: [place];  Service: General;  Laterality: N/A;  . TEAR DUCT PROBING  2006   left  . TONSILLECTOMY  1986    There were no vitals filed for this visit.  Subjective Assessment - 09/18/17 1021    Subjective  Pt reports her Rt elbow has been more bothersome since last visit.  Needling helped relax back; now Lt side is bothering her. She is aggrivated things are slow to improve.    Currently in Pain?  Yes    Pain Score  4     Pain Orientation  Lower;Left    Pain Descriptors / Indicators  Aching    Aggravating Factors   prolonged standing     Pain Relieving Factors  hot showers     Multiple Pain Sites  Yes     Pain Score  4    Pain Location  Elbow    Pain Orientation  Right    Pain Descriptors / Indicators  Throbbing         [facility] PT Assessment - 09/18/17 0001      Assessment   Medical Diagnosis  Chronic low back pain; Rt elbow pain.     Referring Provider  Dr. Ninfa Linden       Palpation   SI assessment   Lt sacral torsion noted in prone; Rt ilium higher than Lt in standing; Rt ASIS lower in standing.     Palpation comment  tenderness along Rt wrist extensors, Rt iliac crest, QL.        [facility] Adult PT Treatment/Exercise - 09/18/17 0001      Modalities   Modalities  Electrical Stimulation;Moist Heat      Moist Heat Therapy   Number Minutes Moist Heat  20 Minutes    Moist Heat Location  Lumbar Spine      Electrical Stimulation   Electrical Stimulation Location  Rt distal lateral humerous and lateral epicondyle/  Rt SI + piriformis     Electrical Stimulation Action  premod to each area    Dealer  Stimulation Parameters  to tolerance    Electrical Stimulation Goals  Pain      Manual Therapy   Manual Therapy  Myofascial release;Soft tissue mobilization;Muscle Energy Technique;Taping    Manual therapy comments  I Strip of Rock tape applied with 10% stretch to Rt wrist ext; small perpendicular stretch place across 1st strip to assist in decomprssion of tissue.      Soft tissue mobilization  STM to Rt/Lt lumbar paraspinals and glutes; TPR to Rt QL.  STM to Rt wrist extensors including TPR.     Muscle Energy Technique  MET to correct elevated Rt ilium (using QL contract/relax in Lt sidelying), ant rotated Rt ilium (in supported hooklying), and Lt sacral torsion (with contract relax of Rt hip rotators in prone)              PT Education - 09/18/17 1733    Education provided  Yes    Education Details  Info on kinesiotape and safe removal instructions.     Person(s) Educated  Patient    Methods  Explanation    Comprehension  Verbalized understanding       PT Short Term  Goals - 09/12/17 1411      PT SHORT TERM GOAL #1   Title  Pt will demo consistency and independent in initial HEP.    Time  4    Period  Weeks    Status  Achieved      PT SHORT TERM GOAL #2   Title  Pt will report a 30% reduction in the frequency and intensity of Rt elbow pain with ADLs and work tasks    Baseline  20% decrease,     Time  4    Period  Weeks    Status  Partially Met      PT SHORT TERM GOAL #3   Title  Pt will demo proper log roll technique with getting on/off the mat table during her sessions, without cues from therapist, in order to decrease strain on her low back.     Time  4    Period  Weeks    Status  Achieved      PT SHORT TERM GOAL #4   Title  Pt will report being able to stand/walk for 20 minutes before LBP increases, which will increase her activity out in the community.     Time  4    Period  Weeks    Status  On-going        PT Long Term Goals - 08/01/17 1335      PT LONG TERM GOAL #1   Title  Pt will demo consistency and independent in advanced HEP, to allow for prevention of symptoms at discharge.     Time  8    Period  Weeks    Status  New      PT LONG TERM GOAL #2   Title  Pt's FOTO score will decrease to < or = 34% limitation, to reflect and improvement in daily activity and low back pain impairment.    Time  8    Period  Weeks    Status  New      PT LONG TERM GOAL #3   Title  Pt will report atleast a 70% reduction in low back pain with home and work tasks to improve her quality of life and participation in her pilates classes.    Time  8    Period  Weeks  Status  New      PT LONG TERM GOAL #4   Title  Pt will demo improved trunk and hip stability, evident by her ability to maintain single leg stance on each LE for atleast 15 sec without trunk rotation.     Time  8    Period  Weeks    Status  New      PT LONG TERM GOAL #5   Title  Pt will demonstrate and verbalize correct body mechanics with ADLs and lifting in order to protect  lumbar spine with daily activity.    Time  8    Period  Weeks    Status  New            Plan - 09/18/17 1733    Clinical Impression Statement  Pt had another reported rash from ionto patch on Rt elbow; ionto held this date and kinesiotape applied in similar area for pain reduction and tissue decompression.  Pt reported elimination of pain in both low back and elbow by end of session.  Pt progressing gradually towards goals.     Clinical Impairments Affecting Rehab Potential  (+) pt age and some improvement in the past with PT, (-) pt lack of understanding of importance of HEP adherence     PT Treatment/Interventions  ADLs/Self Care Home Management;Cryotherapy;Electrical Stimulation;Moist Heat;Iontophoresis 49m/ml Dexamethasone;Functional mobility training;Therapeutic activities;Therapeutic exercise;Patient/family education;Neuromuscular re-education;Manual techniques;Taping;Dry needling;Passive range of motion    PT Next Visit Plan  assess response to MET corrections;  re-assessment (FOTO/goals).      Consulted and Agree with Plan of Care  Patient       Patient will benefit from skilled therapeutic intervention in order to improve the following deficits and impairments:  Decreased activity tolerance, Hypomobility, Decreased strength, Postural dysfunction, Increased muscle spasms, Improper body mechanics, Pain  Visit Diagnosis: Pain in right elbow  Chronic bilateral low back pain, with sciatica presence unspecified  Muscle spasm of back  Abnormal posture     Problem List Patient Active Problem List   Diagnosis Date Noted  . Low back pain 10/25/2010   JKerin Perna PTA 09/18/17 5:37 PM  [facility] [address] [place] [address]Phone: [phone]  Fax:  [phone] Name: Barbara PUNMRN: [id number] of Birth: [id number]   *Addendum to resolve episode of care and d/c pt  from PT  [place] Visits from Start of Care: 9  Current functional level related to goals / functional outcomes: See above for more details    Remaining deficits: See above for more details    Education / Equipment: See above for more details   Plan: Patient agrees to discharge.  Patient goals were partially met. Patient is being discharged due to not returning since the last visit.  ?????    8:43 AM,10/28/17 SMoodus DCedar Valeat BPeculiar

## 2017-09-26 ENCOUNTER — Ambulatory Visit: Payer: BLUE CROSS/BLUE SHIELD | Admitting: Physical Therapy

## 2017-12-10 NOTE — Progress Notes (Signed)
Chief Complaint  Patient presents with  . Annual Exam    fasting annual exam, no pap sees Dr Philis Pique. Gets regular eye exams with [facility]. No new concerns. Her back has been very inflammed for the last few weeks.     Barbara Oconnor is a 52 y.o. female who presents for a complete physical.  She has the following concerns:  Chronic back pain, and right elbow pain:  She saw Dr. Ninfa Linden, and in 06/2017 was referred for PT for both back and elbow. She did that through December. She had cortisone shot by the PA in 06/2017 for lateral epicondylitis. PT helped some. Continues to wear brace/strap. She takes neurontin and uses tramadol prn.  Last filled tramadol #40 one year ago, denies getting it elsewhere. She has noticed that the tramadol makes her more motion sensitive. Has been needing it over the last week. Had recent travel, yardwork, so back was flaring. She also has flexeril to use prn. She takes this the night before traveling, helps with her spasms.  She is asking if she can take it together with flexeril (hasn't).   (Previously saw Dr. Lorin Mercy.)  No longer getting the radiating pain into the legs or the pain in the skin since taking the neurontin; pain was worse when she tried backing down to just 2/day.  She generally requires tramadol twice a day when she is traveling, prolonged sitting. She travels every 5 weeks.  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 Topical med (diclofenac) also helps some for elbow, only uses it occasionally (not currently, since pain is better with tramadol).  Hot showers and heating pad is also helpful in treating her pain.  She had 2 cortisone injections in the past, reportedly didn't help all that much, pain just changed some. She "can't take ibuprofen"--inflames back more, as does meloxicam. Aleve constipates her. Hasn't used tylenol in a year, works temporarily. Topical  medications haven't been helpful in the past (OTC).  She sees Dr. Philis Pique for GYN care, last seen 06/2017. She is postmenopausal. She is on HRT and denies hot flashes. She is asking about how long to take, safety.  H/o anal fissures. She had Botox to treat fissures, but doesn't really feel she got much benefit. She changed her wiping habits (now uses moist wipes), and taking Miralax helps.  Just recently she has had more bleeding after bowel movements.  Goiter: Hasn't seen Dr. Buddy Duty in years. Denies any change in size, trouble swallowing. Denies hair/skin/bowel changes.prevously reported improvement in her nails when taking hair/nail vitamin; hasn't been taking in a long time, and nails continue to be soft/ripping.  Last year she reported having ongoing/recurrent problems with discolored and thickened right great toenail.  6 years ago she took oral meds. It never got better (?thinks it maybe helped some?), did a second round of antifungal meds also.  It has never spread elsewhere. She was interested in treatment options, and I recommended she f/u with [facility] to discuss other options.  She never went back to them.  Has h/o trauma at that toe (proximal to the nailbed).   Immunization History  Administered Date(s) Administered  . Influenza Split 08/01/2010, 06/15/2011, 07/01/2013, 06/24/2014  . Influenza,inj,Quad PF,6+ Mos 06/01/2015  . Influenza-Unspecified 08/25/2017  . Tdap 04/06/2011   She reports getting both shingrix shots (July 2018 and maybe 3 months later). [facility] Last Pap smear: 06/2017 Last mammogram: 06/2017 through Dr.  Horvath's office Last colonoscopy: 2011; she was told to f/u in 10 years Last DEXA: never Dentist: twice yearly Ophtho: earlier this year Exercise: Teaches pilates once weekly.  Yardwork. Lipids: Lab Results  Component Value Date   CHOL 138 11/30/2015   HDL 52 11/30/2015   LDLCALC 76 11/30/2015   TRIG 50 11/30/2015   CHOLHDL 2.7 11/30/2015   vitamin D-OH 54 in 11/2015  Past Medical History:  Diagnosis Date  . Complication of anesthesia    slow awakening  . Goiter 2002   u/s 2002, 2010 (Dr. Buddy Duty)  . H/O motion sickness   . Low back pain 10/2010  . Multiple thyroid nodules   . Rectal bleeding    anal fissure    Past Surgical History:  Procedure Laterality Date  . COLONOSCOPY  03/2010   Jule Ser); benign polyp, told to repeat 10 years  . EXAMINATION UNDER ANESTHESIA N/A 11/04/2014   Procedure: EXAM UNDER ANESTHESIA;  Surgeon: Leighton Ruff, MD;  Location: [facility];  Service: General;  Laterality: N/A;  . hemorrhoid banding    . SPHINCTEROTOMY N/A 11/04/2014   Procedure: CHEMICAL SPHINCTEROTOMY WITH BOTOX/ fisurectomy;  Surgeon: Leighton Ruff, MD;  Location: [place];  Service: General;  Laterality: N/A;  . TEAR DUCT PROBING  2006   left  . TONSILLECTOMY  1986    Social History   Socioeconomic History  . Marital status: Married    Spouse name: Not on file  . Number of children: 2  . Years of education: Not on file  . Highest education level: Not on file  Social Needs  . Financial resource strain: Not on file  . Food insecurity - worry: Not on file  . Food insecurity - inability: Not on file  . Transportation needs - medical: Not on file  . Transportation needs - non-medical: Not on file  Occupational History  . Occupation: teaches pilates  . 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 Smoking status: Never Smoker  . Smokeless tobacco: Never Used  Substance and Sexual Activity  . Alcohol use: Yes    Comment: occasional  . Drug use: No  . Sexual activity: Yes    Partners: Male    Birth control/protection: Surgical    Comment: husband had vasectomy  Other Topics Concern  . Not on file  Social History Narrative   Lives with husband and 1 son, 1 daughter (in college). No pets   Daughter has chronic migraines.   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 Cancer Mother 77       breast cancer  . Hyperlipidemia Father   . Hypertension Father   . Bipolar disorder Sister   . Migraines Daughter   . Cancer Maternal Aunt        thyroid cancer  . Cancer Paternal Uncle        lymphatic  . Diabetes Paternal Grandmother   . Heart disease Paternal Grandfather     Outpatient Encounter Medications as of 12/11/2017  Medication Sig Note  . cyclobenzaprine (FLEXERIL) 10 MG tablet Take 1 tablet (10 mg total) by mouth 3 (three) times daily as needed for muscle spasms. 12/11/2017: Uses at night prior to traveling  . gabapentin (NEURONTIN) 300 MG capsule TAKE ONE CAPSULE BY MOUTH 3 TIMES A DAY 12/11/2017: 1 in the morning, 2qHS  . norethindrone-ethinyl estradiol (FEMHRT 1/5) 1-5 MG-MCG TABS Take 1 tablet by  mouth daily.   . polyethylene glycol (MIRALAX / GLYCOLAX) packet Take 6 g by mouth daily.    . traMADol (ULTRAM) 50 MG tablet TAKE 1 TABLET BY MOUTH EVERY 6 TO 8 HOURS AS NEEDED FOR PAIN   . [DISCONTINUED] traMADol (ULTRAM) 50 MG tablet TAKE 1 TABLET BY MOUTH EVERY 6 TO 8 HOURS AS NEEDED FOR PAIN 12/11/2017: Uses prn  . Diclofenac Sodium (PENNSAID) 2 % SOLN Place 1 Tube onto the skin 4 (four) times daily. (Patient not taking: Reported on 12/05/2016)    No facility-administered encounter medications on file as of 12/11/2017.     Allergies  Allergen Reactions  . Aspirin   . Ibuprofen Other (See Comments)    ROS: The patient denies anorexia, fever, headaches, vision changes, decreased hearing, ear pain, sore throat, breast concerns, chest pain, palpitations, dizziness, syncope, dyspnea on exertion, cough, swelling, nausea, vomiting, diarrhea, abdominal pain, melena, indigestion/heartburn, hematuria, incontinence, dysuria, vaginal bleeding, discharge, odor or itch, genital lesions, numbness, tingling, weakness, tremor, suspicious skin lesions, depression, anxiety, abnormal bleeding/bruising, or enlarged  lymph nodes.  Menopausal--on HRT without any significant hot flashes +constipation--controlled by Miralax. Not constipated, but has been having BRBPR over the past week since taking tramadol +ongoing low back pain, chronic.  Vertigo and motion sickness related to taking tramadol Some knee pain on the stairs.   PHYSICAL EXAM:  BP 138/80   Pulse 68   Ht 5' 7.5" (1.715 m)   Wt 171 lb (77.6 kg)   BMI 26.39 kg/m   Wt Readings from Last 3 Encounters:  12/11/17 171 lb (77.6 kg)  12/05/16 165 lb (74.8 kg)  11/30/15 163 lb 12.8 oz (74.3 kg)     General Appearance:  Alert, cooperative, no distress, appears stated age.  Head:  Normocephalic, without obvious abnormality, atraumatic   Eyes:  PERRL, conjunctiva/corneas clear, EOM's intact, fundi benign   Ears:  Normal TM's and external ear canals   Nose:  Nares normal, mucosa normal, no drainage or sinus tenderness   Throat:  Lips, mucosa, and tongue normal; teeth and gums normal. Some cobblestoning noted posteriorly  Neck:  Supple, no lymphadenopathy; thyroid: Small thyroid nodule R lower lobe (pea-size), and some generalized enlargement of thyroid--stable/unchanged; nontender. no carotid bruit or JVD   Back:  Spine nontender, no muscle spasm. No CVA tenderness. Nontender at [facility] joints  Lungs:  Clear to auscultation bilaterally without wheezes, rales or ronchi; respirations unlabored   Chest Wall:  No tenderness or deformity   Heart:  Regular rate and rhythm, S1 and S2 normal, no murmur, rub or gallop   Breast Exam:  Deferred to GYN  Abdomen:  Soft, non-tender, nondistended, normoactive bowel sounds, no masses, no hepatosplenomegaly   Genitalia:  Deferred to GYN  Rectal:  Deferred to GYN  Extremities:  No clubbing, cyanosis or edema. Wearing elbow strap R elbow  Pulses:  2+ and symmetric all extremities   Skin:  Skin color, texture, turgor normal, no rashes or lesions. Right  great toenail--thickened and yellow (clear medially). Surrounding skin is normal--no ingrowing nail, nontender, no erythema.  Lymph nodes:  Cervical, supraclavicular, and axillary nodes normal   Neurologic:  CNII-XII intact, normal strength, sensation and gait; reflexes 2+ and symmetric throughout     Psych:  Normal mood, affect, hygiene and grooming   ASSESSMENT/PLAN:  Annual physical exam - Plan: POCT Urinalysis DIP (Proadvantage Device), CBC with Differential/Platelet, Comprehensive metabolic panel, TSH  Goiter - and small thyroid nodule, stable in size - Plan: TSH  Chronic low back pain, unspecified back pain laterality, with sciatica presence unspecified - recent flare requiring tramadol; Disc flexeril use; continue gabapentin, tramadol prn/sparingly - Plan: traMADol (ULTRAM) 50 MG tablet  Onychomycosis of right great toe - vs changes due to trauma. Encouraged f/u with [facility] (may need cx, briefly discussed laser vs other tx's available)   Cbc, c-met, TSH  Discussed current HRT recs, risks. F/u with GYN yearly  Discussed risks/SE of tramadol, flexeril. Discussed tramadol masks pain, doesn't treat underlying problem or help with healing.  Flexeril should be used first for spasm, and can use tylenol for milder pain, before tramadol Her bottle of #40 from last year still has a few left, so clearly doesn't overuse this med. (will verify not getting from other sources, pt denies). Cont gabapentin--doesn't yet need refill, just recently got.    Discussed monthly self breast exams and yearly mammograms; at least 30 minutes of aerobic activity at least 5 days/week, weight-bearing exercise at least 2-3x/wk; proper sunscreen use reviewed; healthy diet, including goals of calcium and vitamin D intake and alcohol recommendations (less than or equal to 1 drink/day) reviewed; regular seatbelt use; changing batteries in smoke detectors. Immunization recommendations  discussed-- Encouraged yearly flu shots. Shingrix recommended. Colonoscopy recommendations reviewed, UTD   Please try and get [facility] to give you the dates of your shingles vaccines--see if they can fax them directly here so that we can update your chart.

## 2017-12-11 ENCOUNTER — Ambulatory Visit (INDEPENDENT_AMBULATORY_CARE_PROVIDER_SITE_OTHER): Payer: BLUE CROSS/BLUE SHIELD | Admitting: Family Medicine

## 2017-12-11 ENCOUNTER — Encounter: Payer: Self-pay | Admitting: Family Medicine

## 2017-12-11 VITALS — BP 130/86 | HR 68 | Ht 67.5 in | Wt 171.0 lb

## 2017-12-11 DIAGNOSIS — Z0000 Encounter for general adult medical examination without abnormal findings: Secondary | ICD-10-CM

## 2017-12-11 DIAGNOSIS — B351 Tinea unguium: Secondary | ICD-10-CM | POA: Diagnosis not present

## 2017-12-11 DIAGNOSIS — Z79890 Hormone replacement therapy (postmenopausal): Secondary | ICD-10-CM

## 2017-12-11 DIAGNOSIS — M545 Low back pain: Secondary | ICD-10-CM

## 2017-12-11 DIAGNOSIS — E049 Nontoxic goiter, unspecified: Secondary | ICD-10-CM | POA: Diagnosis not present

## 2017-12-11 DIAGNOSIS — G8929 Other chronic pain: Secondary | ICD-10-CM

## 2017-12-11 LAB — POCT URINALYSIS DIP (PROADVANTAGE DEVICE)
BILIRUBIN UA: NEGATIVE
Glucose, UA: NEGATIVE mg/dL
Leukocytes, UA: NEGATIVE
Nitrite, UA: NEGATIVE
PH UA: 6 (ref 5.0–8.0)
Protein Ur, POC: NEGATIVE mg/dL
RBC UA: NEGATIVE
Specific Gravity, Urine: 1.03
Urobilinogen, Ur: NEGATIVE

## 2017-12-11 MED ORDER — TRAMADOL HCL 50 MG PO TABS: ORAL_TABLET | ORAL | 0 refills | Status: DC

## 2017-12-11 NOTE — Patient Instructions (Addendum)
  HEALTH MAINTENANCE RECOMMENDATIONS:  It is recommended that you get at least 30 minutes of aerobic exercise at least 5 days/week (for weight loss, you may need as much as 60-90 minutes). This can be any activity that gets your heart rate up. This can be divided in 10-15 minute intervals if needed, but try and build up your endurance at least once a week.  Weight bearing exercise is also recommended twice weekly.  Eat a healthy diet with lots of vegetables, fruits and fiber.  "Colorful" foods have a lot of vitamins (ie green vegetables, tomatoes, red peppers, etc).  Limit sweet tea, regular sodas and alcoholic beverages, all of which has a lot of calories and sugar.  Up to 1 alcoholic drink daily may be beneficial for women (unless trying to lose weight, watch sugars).  Drink a lot of water.  Calcium recommendations are 1200-1500 mg daily (1500 mg for postmenopausal women or women without ovaries), and vitamin D 1000 IU daily.  This should be obtained from diet and/or supplements (vitamins), and calcium should not be taken all at once, but in divided doses.  Monthly self breast exams and yearly mammograms for women over the age of 48 is recommended.  Sunscreen of at least SPF 30 should be used on all sun-exposed parts of the skin when outside between the hours of 10 am and 4 pm (not just when at beach or pool, but even with exercise, golf, tennis, and yard work!)  Use a sunscreen that says "broad spectrum" so it covers both UVA and UVB rays, and make sure to reapply every 1-2 hours.  Remember to change the batteries in your smoke detectors when changing your clock times in the spring and fall.  Use your seat belt every time you are in a car, and please drive safely and not be distracted with cell phones and texting while driving.  You may use flexeril along with tramadol. Use the flexeril first if having muscle spasms, as this is treating the causer rather than masking pain. It may be sedating, so  use lower dose and/or with caution when taking during the day. You may benefit from tylenol as needed for milder pain, rather than the tramadol.  Restart hair/skin/nail vitamins to help with your nails.  Please try and get Barbara Oconnor to give you the dates of your shingles vaccines--see if they can fax them directly here so that we can update your chart.

## 2017-12-12 LAB — CBC WITH DIFFERENTIAL/PLATELET
BASOS: 0 %
Basophils Absolute: 0 10*3/uL (ref 0.0–0.2)
EOS (ABSOLUTE): 0 10*3/uL (ref 0.0–0.4)
Eos: 1 %
Hematocrit: 40.4 % (ref 34.0–46.6)
Hemoglobin: 13.6 g/dL (ref 11.1–15.9)
IMMATURE GRANS (ABS): 0 10*3/uL (ref 0.0–0.1)
Immature Granulocytes: 0 %
LYMPHS: 33 %
Lymphocytes Absolute: 2.1 10*3/uL (ref 0.7–3.1)
MCH: 31.9 pg (ref 26.6–33.0)
MCHC: 33.7 g/dL (ref 31.5–35.7)
MCV: 95 fL (ref 79–97)
MONOS ABS: 0.4 10*3/uL (ref 0.1–0.9)
Monocytes: 6 %
NEUTROS ABS: 3.9 10*3/uL (ref 1.4–7.0)
Neutrophils: 60 %
PLATELETS: 290 10*3/uL (ref 150–379)
RBC: 4.26 x10E6/uL (ref 3.77–5.28)
RDW: 12.7 % (ref 12.3–15.4)
WBC: 6.4 10*3/uL (ref 3.4–10.8)

## 2017-12-12 LAB — COMPREHENSIVE METABOLIC PANEL
A/G RATIO: 2 (ref 1.2–2.2)
ALT: 11 IU/L (ref 0–32)
AST: 20 IU/L (ref 0–40)
Albumin: 4.5 g/dL (ref 3.5–5.5)
Alkaline Phosphatase: 68 IU/L (ref 39–117)
BILIRUBIN TOTAL: 0.3 mg/dL (ref 0.0–1.2)
BUN / CREAT RATIO: 12 (ref 9–23)
BUN: 9 mg/dL (ref 6–24)
CHLORIDE: 104 mmol/L (ref 96–106)
CO2: 23 mmol/L (ref 20–29)
Calcium: 9 mg/dL (ref 8.7–10.2)
Creatinine, Ser: 0.78 mg/dL (ref 0.57–1.00)
GFR calc Af Amer: 102 mL/min/{1.73_m2} (ref >59)
GFR calc non Af Amer: 88 mL/min/{1.73_m2} (ref >59)
GLOBULIN, TOTAL: 2.2 g/dL (ref 1.5–4.5)
Glucose: 86 mg/dL (ref 65–99)
POTASSIUM: 3.8 mmol/L (ref 3.5–5.2)
SODIUM: 141 mmol/L (ref 134–144)
Total Protein: 6.7 g/dL (ref 6.0–8.5)

## 2017-12-12 LAB — TSH: TSH: 0.723 u[IU]/mL (ref 0.450–4.500)

## 2018-01-06 ENCOUNTER — Other Ambulatory Visit (INDEPENDENT_AMBULATORY_CARE_PROVIDER_SITE_OTHER): Payer: Self-pay | Admitting: Physician Assistant

## 2018-01-07 NOTE — Telephone Encounter (Signed)
Please advise 

## 2018-01-07 NOTE — Telephone Encounter (Signed)
Should get from PCP or return here for visiti

## 2018-02-09 ENCOUNTER — Encounter: Payer: Self-pay | Admitting: Family Medicine

## 2018-02-10 MED ORDER — CYCLOBENZAPRINE HCL 10 MG PO TABS: 10.0000 mg | ORAL_TABLET | Freq: Three times a day (TID) | ORAL | 0 refills | Status: DC | PRN

## 2018-02-23 ENCOUNTER — Other Ambulatory Visit: Payer: Self-pay | Admitting: Family Medicine

## 2018-06-24 DIAGNOSIS — M79605 Pain in left leg: Secondary | ICD-10-CM | POA: Insufficient documentation

## 2018-07-20 ENCOUNTER — Other Ambulatory Visit: Payer: Self-pay | Admitting: Family Medicine

## 2018-07-21 NOTE — Telephone Encounter (Signed)
Last filled #40 02/10/2018

## 2018-07-21 NOTE — Telephone Encounter (Signed)
Is this okay to refill? 

## 2018-07-25 DIAGNOSIS — Z6826 Body mass index (BMI) 26.0-26.9, adult: Secondary | ICD-10-CM | POA: Diagnosis not present

## 2018-07-25 DIAGNOSIS — Z01419 Encounter for gynecological examination (general) (routine) without abnormal findings: Secondary | ICD-10-CM | POA: Diagnosis not present

## 2018-07-25 DIAGNOSIS — Z1231 Encounter for screening mammogram for malignant neoplasm of breast: Secondary | ICD-10-CM | POA: Diagnosis not present

## 2018-07-25 LAB — HM MAMMOGRAPHY

## 2018-07-31 ENCOUNTER — Telehealth: Payer: Self-pay | Admitting: Family Medicine

## 2018-07-31 NOTE — Telephone Encounter (Signed)
Received requested records from [facility]. Included are notes and mammogram. Sending back for review.

## 2018-08-04 ENCOUNTER — Other Ambulatory Visit: Payer: Self-pay | Admitting: Family Medicine

## 2018-08-04 NOTE — Telephone Encounter (Signed)
Called patient to see if she needed this as it was a few weeks early. She said she does have a few weeks left but was wanting to know if you could send now and they can keep on hold-she doesn't want to pick up for a few weeks.

## 2018-08-09 ENCOUNTER — Encounter: Payer: Self-pay | Admitting: Family Medicine

## 2018-08-11 ENCOUNTER — Encounter: Payer: Self-pay | Admitting: *Deleted

## 2018-10-04 ENCOUNTER — Encounter: Payer: Self-pay | Admitting: Family Medicine

## 2018-11-11 ENCOUNTER — Other Ambulatory Visit: Payer: Self-pay | Admitting: Family Medicine

## 2018-11-11 NOTE — Telephone Encounter (Signed)
Is this ok to refill?  

## 2018-11-11 NOTE — Telephone Encounter (Signed)
Re-routing to you

## 2018-11-12 NOTE — Telephone Encounter (Signed)
I apparently don't know how to route this without it automatically sending in the refill. I didn't intend to refill.  Maybe you can show me how to re-route within approving the refill.  Sorry. Barbara Oconnor

## 2018-12-16 NOTE — Patient Instructions (Addendum)
  HEALTH MAINTENANCE RECOMMENDATIONS:  It is recommended that you get at least 30 minutes of aerobic exercise at least 5 days/week (for weight loss, you may need as much as 60-90 minutes). This can be any activity that gets your heart rate up. This can be divided in 10-15 minute intervals if needed, but try and build up your endurance at least once a week.  Weight bearing exercise is also recommended twice weekly.  Eat a healthy diet with lots of vegetables, fruits and fiber.  "Colorful" foods have a lot of vitamins (ie green vegetables, tomatoes, red peppers, etc).  Limit sweet tea, regular sodas and alcoholic beverages, all of which has a lot of calories and sugar.  Up to 1 alcoholic drink daily may be beneficial for women (unless trying to lose weight, watch sugars).  Drink a lot of water.  Calcium recommendations are 1200-1500 mg daily (1500 mg for postmenopausal women or women without ovaries), and vitamin D 1000 IU daily.  This should be obtained from diet and/or supplements (vitamins), and calcium should not be taken all at once, but in divided doses.  Monthly self breast exams and yearly mammograms for women over the age of 28 is recommended.  Sunscreen of at least SPF 30 should be used on all sun-exposed parts of the skin when outside between the hours of 10 am and 4 pm (not just when at beach or pool, but even with exercise, golf, tennis, and yard work!)  Use a sunscreen that says "broad spectrum" so it covers both UVA and UVB rays, and make sure to reapply every 1-2 hours.  Remember to change the batteries in your smoke detectors when changing your clock times in the spring and fall.  Use your seat belt every time you are in a car, and please drive safely and not be distracted with cell phones and texting while driving.   Consider re-trying an antiperspirant (you may need to try a few), to see if it helps with the odor and not cause the bumps.  Consider following up with Dr. Lorin Mercy if  your back pain isn't improving.  We can try a short course of oral steroids in the interim, if you feel like pain is worsening.    We spoke of gradually increasing the gabapentin dose to 1200mg  daily (starting a low dose mid-day and increasing to the full 300mg  if tolerated--so would be 300/300/600).  I recommend you seeing [facility] for possible evaluation/treatment of your right great toenail.

## 2018-12-16 NOTE — Progress Notes (Signed)
Chief Complaint  Patient presents with  . Annual Exam    fasting annual exam no pap. Sees eye doctor regularly. Ringing in ears that is more constant. Top of her left leg is hurting and has seen chiro, acupunture and not getting any better. (meds not helping either) Rash on her face would like you to look at. Would like to know if she will ever come off gabapentin. Left armpit odor-no matter what she does she cannot get rid of the odor.     Barbara Oconnor is a 53 y.o. female who presents for a complete physical.  She has the following concerns:  She has had intermittent tinnitus in the past, but lately has been constant, R>L ear.  Denies any hearing loss, no mention of hearing concerns by her family. Denies vertigo, headaches.  Rash on her face--redness on face for years, just covers with makeup. She also has some bumps on her cheeks and forehead. Husband has rosacea and she is concerned about progression.  Left armpit odor. She stopped using antiperspirant a couple of years ago. Doesn't sweat in the winter, but notices an odor L>R, even after bathing. No redness or rash. Worse if she wears Nylon, not as bad when wears cotton.  Message from patient in 09/2018, she reported having pain in her left  leg. She saw chiro 3x and accupuncturist a couple of times.  Seeing Santiago Glad at [facility].  She was told it is coming from her back.  Accupuncurist also said it was from her back.  Pain is much better than it was initially, after seeing chiro.  Pain is upper posterior leg/buttock/thigh, and spreads to the lower hamstring. First noticed after a lot of hiking in [place]. 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 Pain flares with walking. Tennis didn't flare her back (or her tennis elbow pain)  Chronicback pain--She takes neurontin and uses tramadol prn.  She also has flexeril to use prn. She takes this the night before traveling, helps with her spasms.   Uses flexeril first, when pain is flaring,  tramadol only when it is very severe. Tramadol causes her some motion sensitivity. Takes gabapentin regularly, without side effects. Takes 300mg  in the morning, 600mg  at bedtime (had trouble remembering a mid-day dose, so changed to 2 qHS instead).  No longer getting the radiating pain into the legsor the pain in the skinsince taking the neurontin; pain was worse when she tried backing down to just 2/day in the past. Her pain was different prior to neurontin--in hip, not the area in the leg that is currently hurting.  She generally requires tramadoltwice a day when she is traveling, prolonged sitting. No longer traveling as often, using more flexeril and less tramadol for her pain.  Hot showersandheating pad is also helpful in treating her pain.  She previously had 2 cortisone injections for her back/leg pain, reportedly didn't help all that much, pain just changed some. She "can't take ibuprofen"--inflames back more, as does meloxicam. Aleve constipates her. Hasn't used tylenol in a year, works temporarily. Topical medications haven't been helpful in the past (OTC).  History of right elbow pain (had PT and cortisone injection in 06/2017 for elbow), much improved. No longer wears tennis elbow strap. Doesn't bother her when playing tennis.  Previously has seen Dr. Rush Farmer (elbow), Dr. Lorin Mercy (back).  She is worried about potentially losing her insurance, if husband gets laid off, wants meds refilled to have on hand just in case.  H/o anal fissures. She had Botox to treat fissures, but doesn't really feel she got much benefit. TakingMiralax helps. Has some intermittent rectal bleeding, less often than in the past (stable in the last couple of years, better since taking Miralax).  Goiter: Hasn't seen Dr. Buddy Duty in years. Denies any change in size, trouble swallowing. Denies hair/skin/bowel changes.  Nails are soft (was improved when taking hair/skin/nail vitamin in the past.  She  continues to have ongoing/recurrent problems with discolored and thickened right great toenail. some discomfort after trimming her nails only.  She took oral meds in the past without improvement.  Denies involvement of any other toes. She is interested in treatment.   Immunization History  Administered Date(s) Administered  . Influenza Split 08/01/2010, 06/15/2011, 07/01/2013, 06/24/2014  . Influenza,inj,Quad PF,6+ Mos 06/01/2015  . Influenza-Unspecified 08/25/2017, 07/20/2018  . Tdap 04/06/2011  . Zoster Recombinat (Shingrix) 03/13/2017, 06/03/2017   Last Pap smear:06/2017 Last mammogram:07/2018 Last colonoscopy: 2011; she was told to f/u in 10 years Last DEXA: never Dentist: twice yearly Ophtho: yearly Exercise:Tennis, walking, 3x/week.  Uses some weight machines at the gym (1-2x/week).. Lipids: Lab Results  Component Value Date   CHOL 138 11/30/2015   HDL 52 11/30/2015   LDLCALC 76 11/30/2015   TRIG 50 11/30/2015   CHOLHDL 2.7 11/30/2015   vitamin D-OH 54 in 11/2015  Past Medical History:  Diagnosis Date  . Complication of anesthesia    slow awakening  . Goiter 2002   u/s 2002, 2010 (Dr. Buddy Duty)  . H/O motion sickness   . Low back pain 10/2010  . Multiple thyroid nodules   . Rectal bleeding    anal fissure    Past Surgical History:  Procedure Laterality Date  . COLONOSCOPY  03/2010   Jule Ser); benign polyp, told to repeat 10 years  . EXAMINATION UNDER ANESTHESIA N/A 11/04/2014   Procedure: EXAM UNDER ANESTHESIA;  Surgeon: Leighton Ruff, MD;  Location: [facility];  Service: General;  Laterality: N/A;  . hemorrhoid banding    . SPHINCTEROTOMY N/A 11/04/2014   Procedure: CHEMICAL SPHINCTEROTOMY WITH BOTOX/ fisurectomy;  Surgeon: Leighton Ruff, MD;  Location: [place];  Service: General;  Laterality: N/A;  . TEAR DUCT PROBING  2006   left  . TONSILLECTOMY  1986    Social History   Socioeconomic History  . Marital status:  Married    Spouse name: Not on file  . Number of children: 2  . Years of education: Not on file  . Highest education level: Not on file  Occupational History  . Occupation: teaches pilates  . 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 Financial resource strain: Not on file  . Food insecurity:    Worry: Not on file    Inability: Not on file  . Transportation needs:    Medical: Not on file    Non-medical: Not on file  Tobacco Use  . Smoking status: Never Smoker  . Smokeless tobacco: Never Used  Substance and Sexual Activity  . Alcohol use: Yes    Comment: 3-4/week (wine)  . Drug use: No  . Sexual activity: Yes    Partners: Male    Birth control/protection: Surgical    Comment: husband had vasectomy  Lifestyle  . Physical activity:    Days per week: Not on file    Minutes per session: Not on file  . Stress: Not on file  Relationships  . Social connections:    Talks on phone:  Not on file    Gets together: Not on file    Attends religious service: Not on file    Active member of club or organization: Not on file    Attends meetings of clubs or organizations: Not on file    Relationship status: Not on file  . Intimate partner violence:    Fear of current or ex partner: Not on file    Emotionally abused: Not on file    Physically abused: Not on file    Forced sexual activity: Not on file  Other Topics Concern  . Not on file  Social History Narrative   Lives with husband and 1 son ([employer]), 1 daughter (in college, graduating 01/2019). No pets   Daughter has chronic migraines.   Works at [employer]   (no longer teaching pilates due to back pain)    Family History  Problem Relation Age of Onset  . Cancer Mother 26       breast cancer  . Hyperlipidemia Father   . Hypertension Father   . Bipolar disorder Sister   . Migraines Daughter   . Cancer Maternal Aunt        thyroid cancer  . Cancer Paternal Uncle        lymphatic  . Diabetes Paternal  Grandmother   . Heart disease Paternal Grandfather     Outpatient Encounter Medications as of 12/17/2018  Medication Sig Note  . cyclobenzaprine (FLEXERIL) 10 MG tablet TAKE 1 TABLET BY MOUTH THREE TIMES A DAY AS NEEDED FOR MUSCLE SPASMS 12/17/2018: Last dose last night  . gabapentin (NEURONTIN) 300 MG capsule TAKE 1 CAPSULE BY MOUTH THREE TIMES A DAY 12/17/2018: Takes 1 in am and 2 qHS  . norethindrone-ethinyl estradiol (FEMHRT 1/5) 1-5 MG-MCG TABS Take 1 tablet by mouth daily.   . polyethylene glycol (MIRALAX / GLYCOLAX) packet Take 6 g by mouth daily.    . Diclofenac Sodium (PENNSAID) 2 % SOLN Place 1 Tube onto the skin 4 (four) times daily. (Patient not taking: Reported on 12/05/2016)   . traMADol (ULTRAM) 50 MG tablet TAKE 1 TABLET BY MOUTH EVERY 6 TO 8 HOURS AS NEEDED FOR PAIN (Patient not taking: Reported on 12/17/2018) 12/17/2018: Uses prn severe pain   No facility-administered encounter medications on file as of 12/17/2018.     Allergies  Allergen Reactions  . Aspirin   . Ibuprofen Other (See Comments)    ROS: The patient denies anorexia, fever, headaches, vision changes, decreased hearing, ear pain, sore throat, breast concerns, chest pain, palpitations, dizziness, syncope, dyspnea on exertion, cough, swelling, nausea, vomiting, diarrhea, abdominal pain, melena, indigestion/heartburn, hematuria, incontinence, dysuria, vaginal bleeding, discharge, odor or itch, genital lesions, numbness, tingling, weakness, tremor, suspicious skin lesions, depression, anxiety, abnormal bleeding/bruising, or enlarged lymph nodes.  Menopausal--on HRT without anysignificanthot flashes +constipation--controlled by Miralax. Intermittent BRBPR +ongoing low back pain, chronic.  Some knee pain on the stairs, improved since she started playing tennis, not daily Tinnitus per HPI Pain in the left posterior thigh. Mild allergies, controlled by OTC antihistamines Odor to L>R armpit Once had bleeding after  intercourse (1-2 months ago).   PHYSICAL EXAM:  BP 138/82   Pulse 68   Ht 5' 7.75" (1.721 m)   Wt 176 lb 9.6 oz (80.1 kg)   BMI 27.05 kg/m   Wt Readings from Last 3 Encounters:  12/17/18 176 lb 9.6 oz (80.1 kg)  12/11/17 171 lb (77.6 kg)  12/05/16 165 lb (74.8 kg)    General Appearance:  Alert, cooperative, appears stated age.She is moving around periodically, unable to sit still for long periods due to discomfort in back/leg  Head:  Normocephalic, without obvious abnormality, atraumatic   Eyes:  PERRL, conjunctiva/corneas clear, EOM's intact, fundi benign   Ears:  Normal TM's and external ear canals   Nose:  Nares normal, mucosa normal, no drainage or sinus tenderness   Throat:  Lips, mucosa, and tongue normal; teeth and gums normal. Some cobblestoning noted posteriorly  Neck:  Supple, no lymphadenopathy; thyroid: mild generalized enlargement of thyroid--stable/unchanged; nontender. no carotid bruit or JVD   Back:  Spine nontender, no muscle spasm. No CVA tenderness.No SI tenderness  Lungs:  Clear to auscultation bilaterally without wheezes, rales or ronchi; respirations unlabored   Chest Wall:  No tenderness or deformity   Heart:  Regular rate and rhythm, S1 and S2 normal, no murmur, rub or gallop   Breast Exam:  Deferred to GYN.  Left axilla appears entirely normal, no odor noted.  Abdomen:  Soft, non-tender, nondistended, normoactive bowel sounds, no masses, no hepatosplenomegaly   Genitalia:  Deferred to GYN  Rectal:  Deferred to GYN  Extremities:  No clubbing, cyanosis or edema. Wearing elbow strap R elbow  Pulses:  2+ and symmetric all extremities   Skin:  Skin color, texture, turgor normal, no rashes or lesions.Right great toenail--thickened and discolored/yellow.  face--some inflammatory papules on the cheeks, a few on chin.  Slightly pink cheeks  Lymph nodes:  Cervical, supraclavicular, and axillary nodes  normal   Neurologic:  CNII-XII intact, normal strength, sensation and gait; reflexes 2+ and symmetric throughout. Negative SLR   Psych: Normal mood, affect, hygiene and grooming    ASSESSMENT/PLAN:  Annual physical exam - Plan: POCT Urinalysis DIP (Proadvantage Device), CBC with Differential/Platelet, Comprehensive metabolic panel, Lipid panel  Acne rosacea - Plan: metroNIDAZOLE (METROCREAM) 0.75 % cream  Chronic low back pain - prior radiculopathy c/b Gabapentin. Now with some hamstring pain--?related to back vs hamstring tendons. Cont chiro/ART. Titrate up gabapentin dose. - Plan: gabapentin (NEURONTIN) 100 MG capsule, cyclobenzaprine (FLEXERIL) 10 MG tablet, traMADol (ULTRAM) 50 MG tablet   Acne rosacea--metrogel  Gabapentin 100mg  to take mid-day, titrating up to 300mg   Discussed monthly self breast exams and yearly mammograms; at least 30 minutes of aerobic activity at least 5 days/week, weight-bearing exercise at least 2-3x/wk; proper sunscreen use reviewed; healthy diet, including goals of calcium and vitamin D intake and alcohol recommendations (less than or equal to 1 drink/day) reviewed; regular seatbelt use; changing batteries in smoke detectors. Immunization recommendations discussed--continue yearly flu shots.Colonoscopy recommendations reviewed, UTD, due again next year. Cont to follow with yearly GYN visits.  Discussed risks/SE of tramadol, flexeril. Flexeril last filled #40 06/2018. Tramadol last filled #40 11/2017 Refilled both again today.  Cont gabapentin (last filled #270 10/2018), 300mg  dose not refilled, not yet needed.    Consider re-trying an antiperspirant (you may need to try a few), to see if it helps with the odor and not cause the bumps.  Consider following up with Dr. Lorin Mercy if your back pain isn't improving.  We can try a short course of oral steroids in the interim, if you feel like pain is worsening.    We spoke of gradually  increasing the gabapentin dose to 1200mg  daily (starting a low dose mid-day and increasing to the full 300mg  if tolerated--so would be 300/300/600).  I recommend you seeing [facility] for possible evaluation/treatment of your right great toenail.   F/u 1  year, sooner prn.

## 2018-12-17 ENCOUNTER — Ambulatory Visit: Payer: BLUE CROSS/BLUE SHIELD | Admitting: Family Medicine

## 2018-12-17 ENCOUNTER — Other Ambulatory Visit: Payer: Self-pay

## 2018-12-17 ENCOUNTER — Encounter: Payer: Self-pay | Admitting: Family Medicine

## 2018-12-17 VITALS — BP 138/82 | HR 68 | Ht 67.75 in | Wt 176.6 lb

## 2018-12-17 DIAGNOSIS — G8929 Other chronic pain: Secondary | ICD-10-CM

## 2018-12-17 DIAGNOSIS — L719 Rosacea, unspecified: Secondary | ICD-10-CM

## 2018-12-17 DIAGNOSIS — M545 Low back pain: Secondary | ICD-10-CM | POA: Diagnosis not present

## 2018-12-17 DIAGNOSIS — H9313 Tinnitus, bilateral: Secondary | ICD-10-CM

## 2018-12-17 DIAGNOSIS — Z0000 Encounter for general adult medical examination without abnormal findings: Secondary | ICD-10-CM | POA: Diagnosis not present

## 2018-12-17 LAB — POCT URINALYSIS DIP (PROADVANTAGE DEVICE)
Bilirubin, UA: NEGATIVE
Blood, UA: NEGATIVE
GLUCOSE UA: NEGATIVE mg/dL
Ketones, POC UA: NEGATIVE mg/dL
LEUKOCYTES UA: NEGATIVE
NITRITE UA: NEGATIVE
PROTEIN UA: NEGATIVE mg/dL
SPECIFIC GRAVITY, URINE: 1.025
Urobilinogen, Ur: NEGATIVE
pH, UA: 5 (ref 5.0–8.0)

## 2018-12-17 MED ORDER — CYCLOBENZAPRINE HCL 10 MG PO TABS: ORAL_TABLET | ORAL | 0 refills | Status: DC

## 2018-12-17 MED ORDER — GABAPENTIN 100 MG PO CAPS: ORAL_CAPSULE | ORAL | 0 refills | Status: DC

## 2018-12-17 MED ORDER — TRAMADOL HCL 50 MG PO TABS: ORAL_TABLET | ORAL | 0 refills | Status: DC

## 2018-12-17 MED ORDER — METRONIDAZOLE 0.75 % EX CREA: TOPICAL_CREAM | Freq: Two times a day (BID) | CUTANEOUS | 5 refills | Status: DC

## 2018-12-18 LAB — COMPREHENSIVE METABOLIC PANEL
ALK PHOS: 78 IU/L (ref 39–117)
ALT: 15 IU/L (ref 0–32)
AST: 21 IU/L (ref 0–40)
Albumin/Globulin Ratio: 1.8 (ref 1.2–2.2)
Albumin: 4.5 g/dL (ref 3.8–4.9)
BILIRUBIN TOTAL: 0.4 mg/dL (ref 0.0–1.2)
BUN/Creatinine Ratio: 10 (ref 9–23)
BUN: 10 mg/dL (ref 6–24)
CO2: 23 mmol/L (ref 20–29)
CREATININE: 0.98 mg/dL (ref 0.57–1.00)
Calcium: 9.2 mg/dL (ref 8.7–10.2)
Chloride: 104 mmol/L (ref 96–106)
GFR calc Af Amer: 77 mL/min/{1.73_m2} (ref >59)
GFR calc non Af Amer: 67 mL/min/{1.73_m2} (ref >59)
GLUCOSE: 88 mg/dL (ref 65–99)
Globulin, Total: 2.5 g/dL (ref 1.5–4.5)
Potassium: 4.7 mmol/L (ref 3.5–5.2)
SODIUM: 143 mmol/L (ref 134–144)
Total Protein: 7 g/dL (ref 6.0–8.5)

## 2018-12-18 LAB — CBC WITH DIFFERENTIAL/PLATELET
BASOS ABS: 0 10*3/uL (ref 0.0–0.2)
BASOS: 1 %
EOS (ABSOLUTE): 0.1 10*3/uL (ref 0.0–0.4)
EOS: 1 %
Hematocrit: 40.8 % (ref 34.0–46.6)
Hemoglobin: 14 g/dL (ref 11.1–15.9)
Immature Grans (Abs): 0 10*3/uL (ref 0.0–0.1)
Immature Granulocytes: 0 %
LYMPHS ABS: 2.1 10*3/uL (ref 0.7–3.1)
Lymphs: 32 %
MCH: 32.3 pg (ref 26.6–33.0)
MCHC: 34.3 g/dL (ref 31.5–35.7)
MCV: 94 fL (ref 79–97)
MONOCYTES: 6 %
MONOS ABS: 0.4 10*3/uL (ref 0.1–0.9)
Neutrophils Absolute: 4 10*3/uL (ref 1.4–7.0)
Neutrophils: 60 %
Platelets: 280 10*3/uL (ref 150–450)
RBC: 4.34 x10E6/uL (ref 3.77–5.28)
RDW: 11.8 % (ref 11.7–15.4)
WBC: 6.6 10*3/uL (ref 3.4–10.8)

## 2018-12-18 LAB — LIPID PANEL
CHOL/HDL RATIO: 2.7 ratio (ref 0.0–4.4)
Cholesterol, Total: 154 mg/dL (ref 100–199)
HDL: 58 mg/dL (ref >39)
LDL Calculated: 80 mg/dL (ref 0–99)
TRIGLYCERIDES: 82 mg/dL (ref 0–149)
VLDL Cholesterol Cal: 16 mg/dL (ref 5–40)

## 2019-01-09 ENCOUNTER — Other Ambulatory Visit: Payer: Self-pay | Admitting: Family Medicine

## 2019-01-09 DIAGNOSIS — G8929 Other chronic pain: Secondary | ICD-10-CM

## 2019-01-09 DIAGNOSIS — M545 Low back pain: Principal | ICD-10-CM

## 2019-01-09 NOTE — Telephone Encounter (Signed)
No, the requested quantity is way off. We filled the 100mg  3/25 for #90.  She was to take 1 tablet mid-day, and could titrate up to 300mg  if needed. She takes 300mg  dose 1 in the morning, and 2 at bedtime.  She was written for 90d supply in 10/2018, so also shouldn't be due yet.  If she titrated up the mid-day dose to 300mg , we could change the 300mg  quantity/directions to say to take 1 in am, 1 mid day and 2 qHS, and change quantity to 360 for 90d supply, and NOT refill the 100mg  dose at all.  Check with pt to see what she is actually taking, and what she is needed, as it doesn't seem like she should be due for either (and definitely not 270 of the 100mg  dose).

## 2019-01-09 NOTE — Telephone Encounter (Signed)
Is this okay to refill? 

## 2019-01-12 NOTE — Telephone Encounter (Signed)
Is this ok to refill?  

## 2019-01-12 NOTE — Telephone Encounter (Signed)
Please read my documentation from the 17th, on this request. (on this current encounter/request)

## 2019-01-14 NOTE — Telephone Encounter (Signed)
I would have expected noticeable improvement at the higher dose after a few weeks

## 2019-01-14 NOTE — Telephone Encounter (Signed)
Spoke with patient and she did not need this refill at this time she is still transitioning to the 300mg  a day. She did want me to ask you after how long will she start feeling the effects of the higher dosage? She is taking 200mg  most days and 30mg  some days and the pain is no better.

## 2019-02-13 ENCOUNTER — Other Ambulatory Visit: Payer: Self-pay | Admitting: Family Medicine

## 2019-02-13 NOTE — Telephone Encounter (Signed)
Is this ok to refill?  

## 2019-06-09 DIAGNOSIS — Z1231 Encounter for screening mammogram for malignant neoplasm of breast: Secondary | ICD-10-CM | POA: Diagnosis not present

## 2019-06-09 DIAGNOSIS — Z6827 Body mass index (BMI) 27.0-27.9, adult: Secondary | ICD-10-CM | POA: Diagnosis not present

## 2019-06-09 DIAGNOSIS — Z01419 Encounter for gynecological examination (general) (routine) without abnormal findings: Secondary | ICD-10-CM | POA: Diagnosis not present

## 2019-06-10 LAB — HM MAMMOGRAPHY

## 2019-07-03 ENCOUNTER — Telehealth: Payer: Self-pay | Admitting: Family Medicine

## 2019-07-03 NOTE — Telephone Encounter (Signed)
Requested records received from [facility] °

## 2019-08-01 ENCOUNTER — Other Ambulatory Visit: Payer: Self-pay | Admitting: Family Medicine

## 2019-08-03 NOTE — Telephone Encounter (Signed)
Is this okay to refill? 

## 2019-08-05 ENCOUNTER — Encounter: Payer: Self-pay | Admitting: Family Medicine

## 2019-09-01 ENCOUNTER — Other Ambulatory Visit: Payer: Self-pay | Admitting: Family Medicine

## 2019-09-01 DIAGNOSIS — M545 Low back pain, unspecified: Secondary | ICD-10-CM

## 2019-09-01 DIAGNOSIS — G8929 Other chronic pain: Secondary | ICD-10-CM

## 2019-09-01 NOTE — Telephone Encounter (Signed)
Pt has 5 pills left but doesn't want to run out. She doesn't use this very often.

## 2019-11-13 ENCOUNTER — Encounter: Payer: Self-pay | Admitting: Family Medicine

## 2019-11-16 ENCOUNTER — Ambulatory Visit (INDEPENDENT_AMBULATORY_CARE_PROVIDER_SITE_OTHER): Payer: Managed Care, Other (non HMO)

## 2019-11-16 ENCOUNTER — Other Ambulatory Visit: Payer: Self-pay

## 2019-11-16 ENCOUNTER — Ambulatory Visit (INDEPENDENT_AMBULATORY_CARE_PROVIDER_SITE_OTHER): Payer: Managed Care, Other (non HMO) | Admitting: Physician Assistant

## 2019-11-16 ENCOUNTER — Encounter: Payer: Self-pay | Admitting: Physician Assistant

## 2019-11-16 DIAGNOSIS — M25532 Pain in left wrist: Secondary | ICD-10-CM | POA: Diagnosis not present

## 2019-11-16 MED ORDER — METHYLPREDNISOLONE 4 MG PO TABS: ORAL_TABLET | ORAL | 0 refills | Status: DC

## 2019-11-16 NOTE — Progress Notes (Signed)
Office Visit Note   Patient: Barbara Oconnor           Date of Birth: 03-16-1966           MRN: [id number] Visit Date: 11/16/2019              Requested by: Rita Ohara, [address] [place],  [place] [address] PCP: Rita Ohara, MD   Assessment & Plan: Visit Diagnoses:  1. Pain in left wrist     Plan: Due to the fact that she has global pain throughout the wrist and hand we will place her on a Medrol Dosepak to see if this helps to calm things down.  Like to see her back in 2 weeks to see if we can pinpoint the source of her pain.  She can continue to use the wrist splint for comfort.  However I did encourage her to bring the handout and work on range of motion she will use heat and ice on the hand.  Questions were encouraged and answered.  Follow-Up Instructions: Return in about 2 weeks (around 11/30/2019).   Orders:  Orders Placed This Encounter  Procedures  . XR Wrist Complete Left   Meds ordered this encounter  Medications  . methylPREDNISolone (MEDROL) 4 MG tablet    Sig: Take as directed    Dispense:  21 tablet    Refill:  0      Procedures: No procedures performed   Clinical Data: No additional findings.   Subjective: Chief Complaint  Patient presents with  . Left Wrist - Pain    HPI Barbara Oconnor comes in today due to left wrist pain.  She states that she was taking a handgun course where she was try firing weapon over and over.  She is having the rack the handgun over and over with her left hand.  After doing this she developed pain in the hand.  She thought this would get better however its been for some 4 weeks.  She has been using a wrist brace which seems to help.  She states that her pain seems to be moving throughout the wrist and hand.  She did initially have some bruising over the hand but this is dissipated. Review of Systems Negative for fevers chills shortness of breath please see HPI  Objective: Vital Signs: There were no vitals  taken for this visit.  Physical Exam Constitutional:      Appearance: She is not ill-appearing or diaphoretic.  Pulmonary:     Effort: Pulmonary effort is normal.  Neurological:     Mental Status: She is alert and oriented to person, place, and time.  Psychiatric:        Mood and Affect: Mood normal.     Ortho Exam Left wrist hand no rashes, skin lesions, ecchymosis or abrasions.  There is no abnormal warmth about the hand or wrist.  Full range of motion of the wrist and hand. Finkelstein's is negative but causes pain lateral aspect of the left wrist.  Minimal tenderness along the extensor first compartment of the left wrist.  Slight tenderness over the dorsal third and fourth metacarpal shafts.  Full range of motion of the left fingers.  Pain with extension of the fingers causing pain up into the left forearm.  Also tenderness over the distal ulna and styloid region.  Ulnar deviation does cause some discomfort in the wrist distal ulna region.  Right wrist and hand full range of motion without pain.  Radial  pulses are 2+.  Sensation grossly intact throughout both hands.  Nontender over the left medial and lateral epicondyles. Specialty Comments:  No specialty comments available.  Imaging: XR Wrist Complete Left  Result Date: 11/16/2019 Left wrist 3 views: No acute fractures.  CMC joint is well-preserved.  No bony abnormalities.    PMFS History: Patient Active Problem List   Diagnosis Date Noted  . Low back pain 10/25/2010   Past Medical History:  Diagnosis Date  . Complication of anesthesia    slow awakening  . Goiter 2002   u/s 2002, 2010 (Dr. Buddy Duty)  . H/O motion sickness   . Low back pain 10/2010  . Multiple thyroid nodules   . Rectal bleeding    anal fissure    Family History  Problem Relation Age of Onset  . Cancer Mother 67       breast cancer  . Dementia Mother   . Hyperlipidemia Father   . Hypertension Father   . Cancer Father 40       stage 4 non-hodgkins  lymphoma  . Lymphoma Father   . Bipolar disorder Sister   . Migraines Daughter   . Cancer Maternal Aunt        thyroid cancer  . Cancer Paternal Uncle        lymphatic  . Diabetes Paternal Grandmother   . Heart disease Paternal Grandfather     Past Surgical History:  Procedure Laterality Date  . COLONOSCOPY  03/2010   Jule Ser); benign polyp, told to repeat 10 years  . EXAMINATION UNDER ANESTHESIA N/A 11/04/2014   Procedure: EXAM UNDER ANESTHESIA;  Surgeon: Leighton Ruff, MD;  Location: [facility];  Service: General;  Laterality: N/A;  . hemorrhoid banding    . SPHINCTEROTOMY N/A 11/04/2014   Procedure: CHEMICAL SPHINCTEROTOMY WITH BOTOX/ fisurectomy;  Surgeon: Leighton Ruff, MD;  Location: [place];  Service: General;  Laterality: N/A;  . TEAR DUCT PROBING  2006   left  . TONSILLECTOMY  1986   Social History   Occupational History  . Occupation: teaches pilates  . 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 Smoking status: Never Smoker  . Smokeless tobacco: Never Used  Substance and Sexual Activity  . Alcohol use: Yes    Comment: 3-4/week (wine)  . Drug use: No  . Sexual activity: Yes    Partners: Male    Birth control/protection: Surgical    Comment: husband had vasectomy

## 2019-11-29 ENCOUNTER — Ambulatory Visit: Payer: Managed Care, Other (non HMO) | Attending: Internal Medicine

## 2019-11-29 DIAGNOSIS — Z23 Encounter for immunization: Secondary | ICD-10-CM | POA: Insufficient documentation

## 2019-11-29 NOTE — Progress Notes (Signed)
   Covid-19 Vaccination Clinic  Name:  Barbara Oconnor    MRN: [id number] DOB: [birth year]  11/29/2019  [name] was observed post Covid-19 immunization for 15 minutes without incident. She was provided with Vaccine Information Sheet and instruction to access the V-Safe system.   [name] was instructed to call 911 with any severe reactions post vaccine: Marland Kitchen Difficulty breathing  . Swelling of face and throat  . A fast heartbeat  . A bad rash all over body  . Dizziness and weakness   Immunizations Administered    Name Date Dose VIS Date Route   Pfizer COVID-19 Vaccine 11/29/2019  5:03 PM 0.3 mL 09/04/2019 Intramuscular   Manufacturer: [place]   Lot: [id number]   [place]: [id number]

## 2019-12-21 ENCOUNTER — Encounter: Payer: Self-pay | Admitting: Family Medicine

## 2019-12-22 ENCOUNTER — Ambulatory Visit: Payer: 59 | Attending: Internal Medicine

## 2019-12-22 DIAGNOSIS — Z23 Encounter for immunization: Secondary | ICD-10-CM

## 2019-12-22 NOTE — Progress Notes (Signed)
Chief Complaint  Patient presents with  . Annual Exam    nonfasting annual exam no pelvic. No new concerns.    Barbara Oconnor is a 54 y.o. female who presents for a complete physical.   She has the following concerns:  She saw [facility] in February with complaint of L wrist/hand pain.  She had taken a handgun course, with a lot of repetitive motion, but pain had persisted. She was treated with medrol dosepak and told to f/u for further evaluation if it persisted. She has been wearing a brace on her left hand (thumb not in the open space, so that the thumb is more immobilized.  She is gradually improving, but slowly.  Trying to keep it immobilized (since told MRI was next step, which might show tears, which would be treated by immobilization).   "wired, nauseated and dizzy" x 3 weeks from the steroids, but it did help some.  Facial rash--diagnosed with probable rosacea at her visit last year and she was started on metrogel. It helped, She only uses it prn.  She reports her skin has improved a lot since she has been going to [place] frequently.  Chronicback pain--She takes neurontin and uses tramadol prn (very rarely).  She also has flexeril to use prn.She takes this the night before traveling, helpswith her spasms, and prn with pain.  Tramadol causes some motion sensitivity, uses only when pain is very severe, hasn't taken in quite a while. She continues to take gabapentin 300mg  in the morning, 600mg  at bedtime. Last year she was also given 100mg  dose, to add mid-day and could titrate up to 300mg , if needed/tolerated, due to having more L sided leg pain, but she reports that the extra gabapentin made her "stupid", so didn't take it. With COVID and exercising less, the pain in the L leg has improved some. She doesn't need refills on either flexeril or tramadol.  She is doing well on current regimen. 9She previously had 2 cortisone injections for her back/leg pain, reportedly didn't help  all that much, pain just changed some.She "can't take ibuprofen"--inflames back more, as does meloxicam. Aleve constipates her. Hasn't used tylenol in a year, works temporarily.Topical medications haven't been helpful in the past (OTC))  History of right elbow pain (had PT and cortisone injection in 06/2017 for elbow), much improved. No longer wears tennis elbow strap. Doesn't bother her when playing tennis. Hasn't played tennis recently. Previously has seen Dr. Rush Farmer (elbow), Dr. Lorin Mercy (back).  H/o anal fissures. She had Botox to treat fissures, but doesn't really feel she got much benefit. TakingMiralax helps.Rectal bleeding is less often than in the past, since taking Miralax. She still has some intermittent bleeding fairly regularly.  Not heavy, bleeding, but when it oozes, there is an odor she doesn't like.  Denies any significant discomfort.  Goiter: Hasn't seen Dr. Buddy Duty in years. Denies any change in size, trouble swallowing. Denies hair/skin/bowel changes.  She has gained some weight over the last year.  Tried dieting and couldn't get weight down. Traveling and eating out more since husband is working in [place]. Not exercising as regularly through [employer] but recently restarted at the gym.  Immunization History  Administered Date(s) Administered  . Influenza Split 08/01/2010, 06/15/2011, 07/01/2013, 06/24/2014  . Influenza, Quadrivalent, Recombinant, Inj, Pf 08/02/2019  . Influenza,inj,Quad PF,6+ Mos 06/01/2015  . Influenza-Unspecified 08/25/2017, 07/20/2018  . PFIZER SARS-COV-2 Vaccination 11/29/2019, 12/22/2019  . Tdap 04/06/2011  . Zoster Recombinat (Shingrix) 03/13/2017, 06/03/2017   Last Pap  smear:06/2017, Dr. Philis Pique Last mammogram:05/2019 Last colonoscopy: 2011; she was told to f/u in 10 years Last DEXA: never Dentist: twice yearly Ophtho: yearly Exercise:elliptical 30 minutes at the gym--recently started (4x/week); no weights due to L wrist issues Lipids: Lab  Results  Component Value Date   CHOL 154 12/17/2018   HDL 58 12/17/2018   LDLCALC 80 12/17/2018   TRIG 82 12/17/2018   CHOLHDL 2.7 12/17/2018   vitamin D-OH 54 in 11/2015. Doesn't recall if she took any vitamins at that time; not taking any now.   PMH, PSH, SH and FH reviewed and updated  Outpatient Encounter Medications as of 12/23/2019  Medication Sig Note  . cyclobenzaprine (FLEXERIL) 10 MG tablet TAKE 1 TABLET BY MOUTH THREE TIMES A DAY AS NEEDED FOR MUSCLE SPASMS   . gabapentin (NEURONTIN) 300 MG capsule TAKE 1 CAPSULE BY MOUTH THREE TIMES A DAY 12/23/2019: Takes 1 tablet in the morning and 2 at bedtime  . norethindrone-ethinyl estradiol (FEMHRT 1/5) 1-5 MG-MCG TABS Take 1 tablet by mouth daily.   . polyethylene glycol (MIRALAX / GLYCOLAX) packet Take 6 g by mouth daily.  12/23/2019: 1/2 capful daily  . metroNIDAZOLE (METROCREAM) 0.75 % cream Apply topically 2 (two) times daily. (Patient not taking: Reported on 12/23/2019) 12/23/2019: Uses prn  . traMADol (ULTRAM) 50 MG tablet TAKE 1 TABLET BY MOUTH EVERY 6 TO 8 HOURS AS NEEDED FOR PAIN (Patient not taking: Reported on 12/23/2019) 12/23/2019: Uses very infrequently for severe pain  . [DISCONTINUED] Diclofenac Sodium (PENNSAID) 2 % SOLN Place 1 Tube onto the skin 4 (four) times daily. (Patient not taking: Reported on 12/05/2016)   . [DISCONTINUED] gabapentin (NEURONTIN) 100 MG capsule Take 1 mid-day, increasing up to 3 pills, as directed (in addition to the 300mg  capsule)   . [DISCONTINUED] Influenza Virus Vacc Split PF 0.5 ML SUSY Afluria 2013-2014(PF) 45 mcg (15 mcg x 3)/0.5 mL intramuscular syringe  inject 0.5 milliliter intramuscularly   . [DISCONTINUED] methylPREDNISolone (MEDROL) 4 MG tablet Take as directed    No facility-administered encounter medications on file as of 12/23/2019.   Allergies  Allergen Reactions  . Aspirin   . Ibuprofen Other (See Comments)    ROS: The patient denies anorexia, fever, headaches, vision changes,  decreased hearing, ear pain, sore throat, breast concerns, chest pain, palpitations, dizziness, syncope, dyspnea on exertion, cough, swelling, nausea, vomiting, diarrhea, abdominal pain, melena, indigestion/heartburn, hematuria, incontinence, dysuria, vaginalbleeding,discharge, odor or itch, genital lesions, numbness, tingling, weakness, tremor, suspicious skin lesions, depression, anxiety, abnormal bleeding/bruising, or enlarged lymph nodes.  Menopausal--on HRT without anysignificanthot flashes, has recurrent hot flashes if she forgets to take it.  Tolerable. +constipation--controlled by Miralax. Intermittent BRBPRper HPI +ongoing low back pain, chronic, stable/controlled Tinnitus, stable/unchanged (not bothered much by any hearing loss) Mild allergies, controlled by OTC antihistamines (uses either cetirizine or claritin prn with good results) Odor to L>R armpit; improved some since using antiperspirant. Some nausea, dizziness and aches from the COVID vaccine she got yesterday. L wrist pain per HPI   PHYSICAL EXAM:  BP 130/80   Pulse 80   Temp 97.7 F (36.5 C) (Other (Comment))   Ht 5\' 8"  (1.727 m)   Wt 182 lb 6.4 oz (82.7 kg)   BMI 27.73 kg/m   Wt Readings from Last 3 Encounters:  12/23/19 182 lb 6.4 oz (82.7 kg)  12/17/18 176 lb 9.6 oz (80.1 kg)  12/11/17 171 lb (77.6 kg)    General Appearance:  Alert, cooperative, appears stated age.  Head:  Normocephalic,  without obvious abnormality, atraumatic   Eyes:  PERRL, conjunctiva/corneas clear, EOM's intact, fundi benign   Ears:  Normal TM's and external ear canals   Nose:  Not examined, wearing mask due to COVID-19 pandemic  Throat:  Not examined, wearing mask due to COVID-19 pandemic  Neck:  Supple, no lymphadenopathy; thyroid: mild generalized enlargement of thyroid, small distinct nodule palpable on the R,nontender. no carotid bruit or JVD   Back:  Spine nontender, no muscle spasm. No CVA  tenderness.No SI tenderness  Lungs:  Clear to auscultation bilaterally without wheezes, rales or ronchi; respirations unlabored   Chest Wall:  No tenderness or deformity   Heart:  Regular rate and rhythm, S1 and S2 normal, no murmur, rub or gallop   Breast Exam:  Deferred to GYN.    Abdomen:  Soft, non-tender, nondistended, normoactive bowel sounds, no masses, no hepatosplenomegaly   Genitalia:  Deferred to GYN  Rectal:  Deferred to GYN/GI, declined exam today  Extremities:  No clubbing, cyanosis or edema.  Pulses:  2+ and symmetric all extremities   Skin:  Skin color, texture, turgor normal, no rashes. See below for description of mole at left upper back   Lymph nodes:  Cervical, supraclavicular, and axillary nodes normal   Neurologic:  CNII-XII intact, normal strength, sensation and gait; reflexes 2+ and symmetric throughout. Negative SLR   Psych: Normal mood, affect, hygiene and grooming   Atypical mole left upper back--unusual shape, slightly darker at the left upper area. 4.5 x 58mm  ASSESSMENT/PLAN:  Annual physical exam - Plan: Comprehensive metabolic panel, CBC with Differential/Platelet, TSH  Weight gain - counseled re: diet, exercise - Plan: TSH  Hot flashes - Plan: TSH  Left wrist pain - wearing brace; to f/u with ortho if persists  Atypical nevus - atypical features present, recommend excisional biopsy  Rectal bleeding - intermittent, h/o anal fissures. Discussed use of diltazem topically, will consider. Due for colonoscopy this year.   Discussed monthly self breast exams and yearly mammograms; at least 30 minutes of aerobic activity at least 5 days/week, weight-bearing exercise at least 2-3x/wk; proper sunscreen use reviewed; healthy diet, including goals of calcium and vitamin D intake and alcohol recommendations (less than or equal to 1 drink/day) reviewed; regular seatbelt use; changing batteries in smoke  detectors. Immunization recommendations discussed--continue yearly flu shots. Tetanus booster next year.Colonoscopy recommendations reviewed, due this year. Cont to follow with yearly GYN visits.

## 2019-12-22 NOTE — Progress Notes (Signed)
   Covid-19 Vaccination Clinic  Name:  Barbara Oconnor    MRN: [id number] DOB: [birth year]  12/22/2019  Ms. Barbara Oconnor was observed post Covid-19 immunization for 15 minutes without incident. She was provided with Vaccine Information Sheet and instruction to access the V-Safe system.   Ms. Barbara Oconnor was instructed to call 911 with any severe reactions post vaccine: Marland Kitchen Difficulty breathing  . Swelling of face and throat  . A fast heartbeat  . A bad rash all over body  . Dizziness and weakness   Immunizations Administered    Name Date Dose VIS Date Route   Pfizer COVID-19 Vaccine 12/22/2019  9:41 AM 0.3 mL 09/04/2019 Intramuscular   Manufacturer: [place]   Lot: [id number]   [place]: [id number]

## 2019-12-22 NOTE — Patient Instructions (Addendum)
  HEALTH MAINTENANCE RECOMMENDATIONS:  It is recommended that you get at least 30 minutes of aerobic exercise at least 5 days/week (for weight loss, you may need as much as 60-90 minutes). This can be any activity that gets your heart rate up. This can be divided in 10-15 minute intervals if needed, but try and build up your endurance at least once a week.  Weight bearing exercise is also recommended twice weekly.  Eat a healthy diet with lots of vegetables, fruits and fiber.  "Colorful" foods have a lot of vitamins (ie green vegetables, tomatoes, red peppers, etc).  Limit sweet tea, regular sodas and alcoholic beverages, all of which has a lot of calories and sugar.  Up to 1 alcoholic drink daily may be beneficial for women (unless trying to lose weight, watch sugars).  Drink a lot of water.  Calcium recommendations are 1200-1500 mg daily (1500 mg for postmenopausal women or women without ovaries), and vitamin D 1000 IU daily.  This should be obtained from diet and/or supplements (vitamins), and calcium should not be taken all at once, but in divided doses.  Monthly self breast exams and yearly mammograms for women over the age of 62 is recommended.  Sunscreen of at least SPF 30 should be used on all sun-exposed parts of the skin when outside between the hours of 10 am and 4 pm (not just when at beach or pool, but even with exercise, golf, tennis, and yard work!)  Use a sunscreen that says "broad spectrum" so it covers both UVA and UVB rays, and make sure to reapply every 1-2 hours.  Remember to change the batteries in your smoke detectors when changing your clock times in the spring and fall. Carbon monoxide detectors are recommended for your home.  Use your seat belt every time you are in a car, and please drive safely and not be distracted with cell phones and texting while driving.  We briefly discussed using diltazem gel topically as needed for anal fissures.  If you are interested in this  prescription, let us know (I believe it is a compounded agent, only certain pharmacies carry it).

## 2019-12-23 ENCOUNTER — Encounter: Payer: Self-pay | Admitting: Family Medicine

## 2019-12-23 ENCOUNTER — Ambulatory Visit (INDEPENDENT_AMBULATORY_CARE_PROVIDER_SITE_OTHER): Payer: 59 | Admitting: Family Medicine

## 2019-12-23 ENCOUNTER — Other Ambulatory Visit: Payer: Self-pay

## 2019-12-23 VITALS — BP 130/80 | HR 80 | Temp 97.7°F | Ht 68.0 in | Wt 182.4 lb

## 2019-12-23 DIAGNOSIS — R232 Flushing: Secondary | ICD-10-CM | POA: Diagnosis not present

## 2019-12-23 DIAGNOSIS — K625 Hemorrhage of anus and rectum: Secondary | ICD-10-CM

## 2019-12-23 DIAGNOSIS — Z0000 Encounter for general adult medical examination without abnormal findings: Secondary | ICD-10-CM

## 2019-12-23 DIAGNOSIS — M25532 Pain in left wrist: Secondary | ICD-10-CM

## 2019-12-23 DIAGNOSIS — D229 Melanocytic nevi, unspecified: Secondary | ICD-10-CM

## 2019-12-23 DIAGNOSIS — R635 Abnormal weight gain: Secondary | ICD-10-CM

## 2019-12-24 LAB — COMPREHENSIVE METABOLIC PANEL
ALT: 10 IU/L (ref 0–32)
AST: 17 IU/L (ref 0–40)
Albumin/Globulin Ratio: 1.9 (ref 1.2–2.2)
Albumin: 4.5 g/dL (ref 3.8–4.9)
Alkaline Phosphatase: 85 IU/L (ref 39–117)
BUN/Creatinine Ratio: 10 (ref 9–23)
BUN: 9 mg/dL (ref 6–24)
Bilirubin Total: 0.4 mg/dL (ref 0.0–1.2)
CO2: 22 mmol/L (ref 20–29)
Calcium: 9.3 mg/dL (ref 8.7–10.2)
Chloride: 103 mmol/L (ref 96–106)
Creatinine, Ser: 0.91 mg/dL (ref 0.57–1.00)
GFR calc Af Amer: 83 mL/min/{1.73_m2} (ref >59)
GFR calc non Af Amer: 72 mL/min/{1.73_m2} (ref >59)
Globulin, Total: 2.4 g/dL (ref 1.5–4.5)
Glucose: 94 mg/dL (ref 65–99)
Potassium: 4.5 mmol/L (ref 3.5–5.2)
Sodium: 140 mmol/L (ref 134–144)
Total Protein: 6.9 g/dL (ref 6.0–8.5)

## 2019-12-24 LAB — CBC WITH DIFFERENTIAL/PLATELET
Basophils Absolute: 0 10*3/uL (ref 0.0–0.2)
Basos: 1 %
EOS (ABSOLUTE): 0 10*3/uL (ref 0.0–0.4)
Eos: 1 %
Hematocrit: 41.9 % (ref 34.0–46.6)
Hemoglobin: 14.6 g/dL (ref 11.1–15.9)
Immature Grans (Abs): 0 10*3/uL (ref 0.0–0.1)
Immature Granulocytes: 0 %
Lymphocytes Absolute: 0.7 10*3/uL (ref 0.7–3.1)
Lymphs: 12 %
MCH: 32.7 pg (ref 26.6–33.0)
MCHC: 34.8 g/dL (ref 31.5–35.7)
MCV: 94 fL (ref 79–97)
Monocytes Absolute: 0.4 10*3/uL (ref 0.1–0.9)
Monocytes: 7 %
Neutrophils Absolute: 4.8 10*3/uL (ref 1.4–7.0)
Neutrophils: 79 %
Platelets: 246 10*3/uL (ref 150–450)
RBC: 4.47 x10E6/uL (ref 3.77–5.28)
RDW: 11.9 % (ref 11.7–15.4)
WBC: 6 10*3/uL (ref 3.4–10.8)

## 2019-12-24 LAB — TSH: TSH: 0.455 u[IU]/mL (ref 0.450–4.500)

## 2019-12-30 ENCOUNTER — Ambulatory Visit: Payer: Managed Care, Other (non HMO)

## 2020-01-02 ENCOUNTER — Encounter: Payer: Self-pay | Admitting: Family Medicine

## 2020-01-06 ENCOUNTER — Ambulatory Visit: Payer: 59 | Admitting: Family Medicine

## 2020-01-26 ENCOUNTER — Telehealth: Payer: Self-pay | Admitting: Family Medicine

## 2020-01-26 DIAGNOSIS — E049 Nontoxic goiter, unspecified: Secondary | ICD-10-CM

## 2020-01-26 DIAGNOSIS — R7989 Other specified abnormal findings of blood chemistry: Secondary | ICD-10-CM

## 2020-01-26 NOTE — Telephone Encounter (Signed)
Advise pt that orders were entered.  Okay for nonfasting lab visit.

## 2020-01-26 NOTE — Telephone Encounter (Signed)
Pt called and she is wanting to come in today and have thyroid labs redone today if possible ( I explained to pt that you are off today and  I can send message but not sure what time of day I may hear back)  She states she is constantly hot and is  light headed and dizzy at times  She is wanting to have thyroid checked before she consults her GYN to adjust her hormones

## 2020-01-27 ENCOUNTER — Other Ambulatory Visit: Payer: BC Managed Care – PPO

## 2020-01-27 ENCOUNTER — Other Ambulatory Visit: Payer: Self-pay

## 2020-01-27 ENCOUNTER — Other Ambulatory Visit: Payer: 59

## 2020-01-27 DIAGNOSIS — E049 Nontoxic goiter, unspecified: Secondary | ICD-10-CM | POA: Diagnosis not present

## 2020-01-27 DIAGNOSIS — R7989 Other specified abnormal findings of blood chemistry: Secondary | ICD-10-CM

## 2020-01-28 ENCOUNTER — Encounter: Payer: Self-pay | Admitting: Family Medicine

## 2020-01-28 LAB — T4, FREE: Free T4: 1.22 ng/dL (ref 0.82–1.77)

## 2020-01-28 LAB — TSH: TSH: 0.896 u[IU]/mL (ref 0.450–4.500)

## 2020-02-03 NOTE — Patient Instructions (Signed)
Sutured Wound Care Sutures are stitches that can be used to close wounds. Taking care of your wound properly can help to prevent pain and infection. It can also help your wound to heal more quickly. Follow instructions from your health care provider about how to care for your sutured wound. Supplies needed:  Soap and water.  A clean bandage (dressing), if needed.  Antibiotic ointment.  A clean towel. How to care for your sutured wound   Keep the wound completely dry for the first 24 hours, or for as long as directed by your health care provider. After 24-48 hours, you may shower or bathe as directed by your health care provider. Do not soak or submerge the wound in water until the sutures have been removed.  After the first 24 hours, clean the wound once a day, or as often as directed by your health care provider, using the following steps: ? Wash the wound with soap and water. ? Rinse the wound with water to remove all soap. ? Pat the wound dry with a clean towel. Do not rub the wound.  After cleaning the wound, apply a thin layer of antibiotic ointment as directed by your health care provider. This will prevent infection and keep the dressing from sticking to the wound.  Follow instructions from your health care provider about how to change your dressing: ? Wash your hands with soap and water. If soap and water are not available, use hand sanitizer. ? Change your dressing at least once a day, or as often as told by your health care provider. If your dressing gets wet or dirty, change it. ? Leave sutures and other skin closures, such as adhesive tape or skin glue, in place. These skin closures may need to stay in place for 2 weeks or longer. If adhesive strip edges start to loosen and curl up, you may trim the loose edges. Do not remove adhesive strips completely unless your health care provider tells you to do that.  Check your wound every day for signs of infection. Watch  for: ? Redness, swelling, or pain. ? Fluid or blood. ? Warmth. ? Pus or a bad smell.  Have the sutures removed as directed by your health care provider. Follow these instructions at home: Medicines  Take or apply over-the-counter and prescription medicines only as told by your health care provider.  If you were prescribed an antibiotic medicine or ointment, take or apply it as told by your health care provider. Do not stop using the antibiotic even if your condition improves. General instructions  To help reduce scarring after your wound heals, cover your wound with clothing or apply sunscreen of at least 30 SPF whenever you are outside.  Do not scratch or pick at your wound.  Avoid stretching your wound.  Raise (elevate) the injured area above the level of your heart while you are sitting or lying down, if possible.  Drink enough fluids to keep your urine clear or pale yellow.  Keep all follow-up visits as told by your health care provider. This is important. Contact a health care provider if:  You received a tetanus shot and you have swelling, severe pain, redness, or bleeding at the injection site.  Your wound breaks open.  You have redness, swelling, or pain around your wound.  You have fluid or blood coming from your wound.  Your wound feels warm to the touch.  You have a fever.  You notice something coming out of your   wound, such as wood or glass.  You have pain that does not get better with medicine.  The skin near your wound changes color.  You need to change your dressing very frequently due to a lot of fluid, blood, or pus draining from the wound.  You develop a new rash.  You develop numbness around the wound. Get help right away if:  You develop severe swelling around your wound.  You have pus or a bad smell coming from your wound.  Your pain suddenly gets worse and is severe.  You develop painful lumps near your wound or anywhere on your  body.  You have a red streak going away from your wound.  The wound is on your hand or foot and: ? You cannot properly move a finger or toe. ? Your fingers or toes look pale or bluish. ? You have numbness that is spreading down your hand, foot, fingers, or toes. Summary  Sutures are stitches that can be used to close wounds.  Taking care of your wound properly can help to prevent pain and infection.  Keep the wound completely dry for the first 24 hours, or for as long as directed by your health care provider. After 24-48 hours, you may shower or bathe as directed by your health care provider. This information is not intended to replace advice given to you by your health care provider. Make sure you discuss any questions you have with your health care provider. Document Revised: 08/23/2017 Document Reviewed: 10/16/2016 Elsevier Patient Education  2020 [employer] Inc[employer]  

## 2020-02-03 NOTE — Progress Notes (Signed)
Chief Complaint  Patient presents with  . Biopsy    on back.     Patient presents for biopsy of mole on her back.  This was recently evaluated, at her physical 12/23/19. There was concern regarding the mole at the left upper back, due to unusual shape, and being slightly darker on the left upper portion of the mole.  It measured 4.5 x 94mm at her physical. She denies any changes, itching or pain.   PMH, PSH, SH reviewed Recent repeat TSH and free T4 were normal.  Outpatient Encounter Medications as of 02/04/2020  Medication Sig Note  . cholecalciferol (VITAMIN D3) 25 MCG (1000 UNIT) tablet Take 1,000 Units by mouth daily. 02/04/2020: 3 times a week  . cyclobenzaprine (FLEXERIL) 10 MG tablet TAKE 1 TABLET BY MOUTH THREE TIMES A DAY AS NEEDED FOR MUSCLE SPASMS   . estradiol (ESTRACE) 0.5 MG tablet estradiol 0.5 mg tablet  Take 1 tablet every day by oral route.   . gabapentin (NEURONTIN) 300 MG capsule TAKE 1 CAPSULE BY MOUTH THREE TIMES A DAY 12/23/2019: Takes 1 tablet in the morning and 2 at bedtime  . norethindrone-ethinyl estradiol (FEMHRT 1/5) 1-5 MG-MCG TABS Take 1 tablet by mouth daily.   . polyethylene glycol (MIRALAX / GLYCOLAX) packet Take 6 g by mouth daily.  12/23/2019: 1/2 capful daily  . metroNIDAZOLE (METROCREAM) 0.75 % cream Apply topically 2 (two) times daily. (Patient not taking: Reported on 02/04/2020) 12/23/2019: Uses prn  . traMADol (ULTRAM) 50 MG tablet TAKE 1 TABLET BY MOUTH EVERY 6 TO 8 HOURS AS NEEDED FOR PAIN (Patient not taking: Reported on 12/23/2019) 12/23/2019: Uses very infrequently for severe pain   No facility-administered encounter medications on file as of 02/04/2020.   Allergies  Allergen Reactions  . Aspirin   . Ibuprofen Other (See Comments)    ROS: no fever, chills, URI symptoms, chest pain, other skin concerns, bleeding, bruising rash. +hot flashes. She reached out to her GYN and had estrogen added Dizziness resolved.   PHYSICAL EXAM:  BP 140/80    Pulse 80   Temp 98 F (36.7 C) (Temporal)   Ht 5\' 8"  (1.727 m)   Wt 185 lb (83.9 kg) Comment: did not want to remove shoes  BMI 28.13 kg/m   Wt Readings from Last 3 Encounters:  12/23/19 182 lb 6.4 oz (82.7 kg)  12/17/18 176 lb 9.6 oz (80.1 kg)  12/11/17 171 lb (77.6 kg)   Pleasant, well-appearing female in no distress Exam of L upper back shows lesion as previously described: unusual shape, slightly darker at the left upper area.   Verbal consent obtained from patient regarding risks and alternatives to biopsy. She is aware of potential for bleeding, infection and scar, and wishes to proceed.  Skin was prepped with betadine followed by alcohol. Skin was anesthetized with 1% lidocaine with epi Elliptical incision was made with 15 blade. Incision closed with 4-0 nylon with interrupted sutures x 3 Bacitracin and bandage was applied. She tolerated the procedure well   ASSESSMENT/PLAN:  Atypical nevus - Plan: Dermatology pathology, PR EXC SKIN BENIG <0.5 CM TRUNK,ARM,LEG   Proper wound care and s/sx of infection were reviewed F/u 10d for suture removal

## 2020-02-04 ENCOUNTER — Encounter: Payer: Self-pay | Admitting: Family Medicine

## 2020-02-04 ENCOUNTER — Other Ambulatory Visit: Payer: Self-pay

## 2020-02-04 ENCOUNTER — Ambulatory Visit: Payer: BC Managed Care – PPO | Admitting: Family Medicine

## 2020-02-04 ENCOUNTER — Other Ambulatory Visit (HOSPITAL_COMMUNITY)
Admission: RE | Admit: 2020-02-04 | Discharge: 2020-02-04 | Disposition: A | Discharge status: Home / Self Care | Payer: BC Managed Care – PPO | Source: Ambulatory Visit | Attending: Family Medicine | Admitting: Family Medicine

## 2020-02-04 VITALS — BP 140/80 | HR 80 | Temp 98.0°F | Ht 68.0 in | Wt 185.0 lb

## 2020-02-04 DIAGNOSIS — D229 Melanocytic nevi, unspecified: Secondary | ICD-10-CM | POA: Diagnosis not present

## 2020-02-05 ENCOUNTER — Other Ambulatory Visit: Payer: Self-pay | Admitting: Internal Medicine

## 2020-02-05 DIAGNOSIS — D225 Melanocytic nevi of trunk: Secondary | ICD-10-CM | POA: Diagnosis not present

## 2020-02-05 DIAGNOSIS — D229 Melanocytic nevi, unspecified: Secondary | ICD-10-CM

## 2020-02-05 NOTE — Addendum Note (Signed)
Addended by: Minette Headland A on: 02/05/2020 01:55 PM   Modules accepted: Orders

## 2020-02-09 LAB — SURGICAL PATHOLOGY

## 2020-02-13 NOTE — Progress Notes (Signed)
Chief Complaint  Patient presents with  . Suture / Staple Removal    removal. And left wrist still bothering her. Been to ortho and has been in a brace-has been 4 months.     Patient presents for suture removal.  She had excisional biopsy of an atypical-appearing mole from her upper back on 02/04/20. Pathology showed:  FINAL MICROSCOPIC DIAGNOSIS:  A. SKIN, LEFT UPPER BACK, BIOPSY: ATYPICAL COMBINED NEVUS, MARGINS FREE  COMMENT: Sections show epitheloid blue nevus cells admixed with a smaller nevocellular component in the dermis. A small, focal scar is  present. No expansile growth or mitoses are seen. This is best classified as an atypical combined nevus. The margins are free. Dr. Nira Retort has a similar opinion.   She has not had any difficulty with the wound--denies fever, drainage, pain. Just reports that it is itchy.  She is wondering what she should do about her ongoing L wrist pain.  Pain started in January, saw orthopedist in February.  She was supposed to f/u in 2 weeks (she thought 4), but reports MRI was recommended. Since immobilization would have been the treatment (as per pt), she didn't f/u, and kept wrist immobilized.  Wears brace during the day, does some ROM stretches and leave brace off at night.  She still has some discomfort, though not constant. Clipping branches with a clipper the other day caused pain. Hurt x 30 mins then resolved, didn't last all day.  Visit reviewed--given medrol dosepak and told to f/u in 2 weeks.  She denies numbness, tingling or weakness, just discomfort/pain.   PMH, PSH, SH reviewed  Outpatient Encounter Medications as of 02/15/2020  Medication Sig Note  . cholecalciferol (VITAMIN D3) 25 MCG (1000 UNIT) tablet Take 1,000 Units by mouth daily. 02/04/2020: 3 times a week  . estradiol (ESTRACE) 0.5 MG tablet estradiol 0.5 mg tablet  Take 1 tablet every day by oral route.   . gabapentin (NEURONTIN) 300 MG capsule TAKE 1 CAPSULE BY MOUTH THREE  TIMES A DAY 12/23/2019: Takes 1 tablet in the morning and 2 at bedtime  . norethindrone-ethinyl estradiol (FEMHRT 1/5) 1-5 MG-MCG TABS Take 1 tablet by mouth daily.   . polyethylene glycol (MIRALAX / GLYCOLAX) packet Take 6 g by mouth daily.  12/23/2019: 1/2 capful daily  . cyclobenzaprine (FLEXERIL) 10 MG tablet TAKE 1 TABLET BY MOUTH THREE TIMES A DAY AS NEEDED FOR MUSCLE SPASMS (Patient not taking: Reported on 02/15/2020)   . traMADol (ULTRAM) 50 MG tablet TAKE 1 TABLET BY MOUTH EVERY 6 TO 8 HOURS AS NEEDED FOR PAIN (Patient not taking: Reported on 12/23/2019) 12/23/2019: Uses very infrequently for severe pain  . [DISCONTINUED] metroNIDAZOLE (METROCREAM) 0.75 % cream Apply topically 2 (two) times daily. (Patient not taking: Reported on 02/04/2020) 12/23/2019: Uses prn   No facility-administered encounter medications on file as of 02/15/2020.   Allergies  Allergen Reactions  . Aspirin   . Ibuprofen Other (See Comments)   ROS: no fever, chills, URI symptoms, rashes. +L wrist pain per HPI.  Otherwise no complaints.   PHYSICAL EXAM:  BP 130/84   Pulse 64   Ht 5\' 8"  (1.727 m)   Wt 184 lb 6.4 oz (83.6 kg)   BMI 28.04 kg/m   Well-appearing, tan female, in good spirits. Left upper back--wound edges are well-approximated, no erythema, crusting, drainage.  Nontender. 3 interrupted sutures were removed.  L wrist--full exam not performed.  She doesn't appear to have any significant swelling, and she does have some range of  motion, which caused some discomfort.   ASSESSMENT/PLAN:  Visit for suture removal - healing well without complication  Atypical nevus - margins clear, reviewed path with patient  Left wrist pain - reviewed prior ortho notes and recs with her--encouraged her to f/u with ortho for further evaluation. May need PT/OT.

## 2020-02-15 ENCOUNTER — Encounter: Payer: Self-pay | Admitting: Family Medicine

## 2020-02-15 ENCOUNTER — Ambulatory Visit: Payer: BC Managed Care – PPO | Admitting: Family Medicine

## 2020-02-15 ENCOUNTER — Other Ambulatory Visit: Payer: Self-pay

## 2020-02-15 VITALS — BP 130/84 | HR 64 | Ht 68.0 in | Wt 184.4 lb

## 2020-02-15 DIAGNOSIS — Z4802 Encounter for removal of sutures: Secondary | ICD-10-CM

## 2020-02-15 DIAGNOSIS — M25532 Pain in left wrist: Secondary | ICD-10-CM

## 2020-02-15 DIAGNOSIS — D229 Melanocytic nevi, unspecified: Secondary | ICD-10-CM | POA: Diagnosis not present

## 2020-02-15 NOTE — Patient Instructions (Signed)
Sutured wound looks good, no evidence of infection. The removed tissue was benign.  Follow up with the orthopedist for ongoing wrist pain. You might benefit from some PT/OT

## 2020-03-07 ENCOUNTER — Encounter: Payer: Self-pay | Admitting: Family Medicine

## 2020-03-07 ENCOUNTER — Other Ambulatory Visit: Payer: Self-pay

## 2020-03-07 ENCOUNTER — Ambulatory Visit: Payer: BC Managed Care – PPO | Admitting: Family Medicine

## 2020-03-07 VITALS — BP 124/76 | HR 72 | Temp 98.2°F | Ht 68.0 in | Wt 184.0 lb

## 2020-03-07 DIAGNOSIS — Z87898 Personal history of other specified conditions: Secondary | ICD-10-CM

## 2020-03-07 DIAGNOSIS — Z77098 Contact with and (suspected) exposure to other hazardous, chiefly nonmedicinal, chemicals: Secondary | ICD-10-CM | POA: Diagnosis not present

## 2020-03-07 DIAGNOSIS — R0789 Other chest pain: Secondary | ICD-10-CM

## 2020-03-07 DIAGNOSIS — R05 Cough: Secondary | ICD-10-CM | POA: Diagnosis not present

## 2020-03-07 DIAGNOSIS — R059 Cough, unspecified: Secondary | ICD-10-CM

## 2020-03-07 HISTORY — DX: Personal history of other specified conditions: Z87.898

## 2020-03-07 MED ORDER — BENZONATATE 200 MG PO CAPS: 200.0000 mg | ORAL_CAPSULE | Freq: Three times a day (TID) | ORAL | 0 refills | Status: DC | PRN

## 2020-03-07 MED ORDER — ALBUTEROL SULFATE (2.5 MG/3ML) 0.083% IN NEBU
2.5000 mg | INHALATION_SOLUTION | Freq: Once | RESPIRATORY_TRACT | Status: AC
Start: 2020-03-07 — End: 2020-03-07
  Administered 2020-03-07: 2.5 mg via RESPIRATORY_TRACT

## 2020-03-07 MED ORDER — ALBUTEROL SULFATE HFA 108 (90 BASE) MCG/ACT IN AERS: 2.0000 | INHALATION_SPRAY | Freq: Four times a day (QID) | RESPIRATORY_TRACT | 1 refills | Status: DC | PRN

## 2020-03-07 NOTE — Progress Notes (Signed)
Chief Complaint  Patient presents with  . Consult    inhaled a disinfectant spray and her chest is tight and causing her to cough now. Date of inhalation was 03/02/20.   They are using Zep disenfectant spray at work, started during [place]. The odor in the bathrooms have been very strong. She had a problem once before, several weeks ago, when odor was very strong in the bathroom--thought they stopped using it, but still finding it around. Using it in enclosed spaces with poor ventilation (bathrooms).  On 6/9, she had another episode--she got overwhelmed by strong smell in the hallway, near the bathroom, trying to have a conversation, started coughing. Went back to her office, where there was no odor, and was able to work. She has had persistent chest tightness and some coughing.  She took Benadryl on 6/9, and taking loratidine since then. Seems to cough more when talking, bothers her most at work. Felt better at home over the weekend, still some pressure/tightness/cough.  Went back to work this morning, and seems a little worse now. She has always been very sensitive to odors/cleaners.  PMH, PSH, SH reviewed  Outpatient Encounter Medications as of 03/07/2020  Medication Sig Note  . cholecalciferol (VITAMIN D3) 25 MCG (1000 UNIT) tablet Take 1,000 Units by mouth daily. 02/04/2020: 3 times a week  . gabapentin (NEURONTIN) 100 MG capsule Take 100 mg by mouth at bedtime.   . norethindrone-ethinyl estradiol (FEMHRT 1/5) 1-5 MG-MCG TABS Take 1 tablet by mouth daily.   . polyethylene glycol (MIRALAX / GLYCOLAX) packet Take 6 g by mouth daily.  12/23/2019: 1/2 capful daily  . [DISCONTINUED] gabapentin (NEURONTIN) 300 MG capsule TAKE 1 CAPSULE BY MOUTH THREE TIMES A DAY   . cyclobenzaprine (FLEXERIL) 10 MG tablet TAKE 1 TABLET BY MOUTH THREE TIMES A DAY AS NEEDED FOR MUSCLE SPASMS (Patient not taking: Reported on 02/15/2020)   . estradiol (ESTRACE) 0.5 MG tablet estradiol 0.5 mg tablet  Take 1 tablet every  day by oral route. (Patient not taking: Reported on 03/07/2020)   . traMADol (ULTRAM) 50 MG tablet TAKE 1 TABLET BY MOUTH EVERY 6 TO 8 HOURS AS NEEDED FOR PAIN (Patient not taking: Reported on 12/23/2019) 12/23/2019: Uses very infrequently for severe pain   No facility-administered encounter medications on file as of 03/07/2020.   Allergies  Allergen Reactions  . Aspirin   . Ibuprofen Other (See Comments)   ROS: no fever, chills, URI symptoms, GI complaints.  Just the dry cough and chest tightness per HPI.  No dizziness, edema, rash or other concerns.   PHYSICAL EXAM:  BP 124/76   Pulse 72   Temp 98.2 F (36.8 C) (Temporal)   Ht 5\' 8"  (1.727 m)   Wt 184 lb (83.5 kg)   SpO2 98%   BMI 27.98 kg/m   Well-appearing, pleasant female, with occasional dry cough, in no distress. At times during the visit she is speaking extensively, and taking deep breaths, without any coughing.  Other times she had spells of dry cough. HEENT: conjunctiva and sclera are clear, EOMI. OP clear Neck: no lymphadenopathy or mass Heart: regular rate and rhythm Lungs: clear bilaterally.  Fair air movement, no wheezes, rales, ronchi. No cough after forced expiration. Abdomen: soft, nontender Extremities: no edema  Peak Flow: 400/410/410 Some coughing after peak flow attempts She was treated albuterol nebulizer. Post peak flows were 450.   ASSESSMENT/PLAN:  Chemical exposure - suspect some RAD/mild pneumonitis.  Had some response with neb, so will  try albuterol. Disc possible prednisone if needed - Plan: albuterol (PROVENTIL) (2.5 MG/3ML) 0.083% nebulizer solution 2.5 mg  Cough - Plan: benzonatate (TESSALON) 200 MG capsule, albuterol (PROVENTIL) (2.5 MG/3ML) 0.083% nebulizer solution 2.5 mg  Chest tightness - Plan: albuterol (VENTOLIN HFA) 108 (90 Base) MCG/ACT inhaler, albuterol (PROVENTIL) (2.5 MG/3ML) 0.083% nebulizer solution 2.5 mg  Proper technique and usage for inhaler was reviewed. F/u if symptoms  persist/worsen. Preferred to avoid prednisone at this time, so will try albuterol inhaler first, as well as tessalon prn for cough, especially while at work.  All questions answered.      Tessalon Albuterol MDI

## 2020-04-04 ENCOUNTER — Other Ambulatory Visit: Payer: Self-pay | Admitting: Family Medicine

## 2020-04-04 NOTE — Telephone Encounter (Signed)
According to her last physical in March 2021, this is what we discussed:  "She continues to take gabapentin 300mg  in the morning, 600mg  at bedtime. Last year she was also given 100mg  dose, to add mid-day and could titrate up to 300mg , if needed/tolerated, due to having more L sided leg pain, but she reports that the extra gabapentin made her "stupid", so didn't take it."  So, I guess we need to see if she requested this and truly wants to re-try this.

## 2020-04-04 NOTE — Telephone Encounter (Signed)
Pt has av=dvised that she is no longer taking the 300 mg gabapentin but has resumed taking the 100 when needed.  Please advise [facility]

## 2020-04-26 ENCOUNTER — Other Ambulatory Visit: Payer: Self-pay | Admitting: Family Medicine

## 2020-04-26 DIAGNOSIS — Z20822 Contact with and (suspected) exposure to covid-19: Secondary | ICD-10-CM | POA: Diagnosis not present

## 2020-04-26 NOTE — Telephone Encounter (Signed)
[facility] is requesting to fill pt gabapentin for 90 days please advise [facility]

## 2020-06-29 DIAGNOSIS — Z01419 Encounter for gynecological examination (general) (routine) without abnormal findings: Secondary | ICD-10-CM | POA: Diagnosis not present

## 2020-06-29 DIAGNOSIS — Z1231 Encounter for screening mammogram for malignant neoplasm of breast: Secondary | ICD-10-CM | POA: Diagnosis not present

## 2020-06-29 LAB — HM MAMMOGRAPHY

## 2020-07-02 NOTE — Progress Notes (Signed)
Chief Complaint  Patient presents with  . Follow-up    for night sweats, rectal bleeding and still having breathing issues.   . Flu Vaccine    does not want today.    She has 3 concerns to be addressed today-- Night sweats Ongoing chest tightness issues Ongoing rectal bleeding  Patient has been complaining of night sweats since COVID vaccine (some after the first, more constant after the second). She discussed these with her GYN, tried higher dose of estrogen, didn't tolerate this (due to feeling bloated, breast tenderness, stopped taking it after a month), and didn't notice improvement. She has been having some hot flashes during the day recently, used to be just at night. Sweats aren't waking her up.  She wakes up first, then sweats. Cooling pillow helps.  No known TB exposures. Negative TB tests in the past (years ago).  Cough per below (associated with chest tightness since chemical exposure), no hemoptysis. She has a gland that intermittently swells on the posterior R neck.  No other swollen glands. No fevers, no weight loss.  Patient takes gabapentin for chronic back pain.  She reports that she has cut her dose back from 900mg  daily to just 200mg  qHS.  She doesn't feel there is a relation to worsening sweats with the lower dose--she states that the night sweats started while she was still taking 900mg  daily, didn't cut back the dose until the end of April.  No longer has radicular symptoms of pain. Pain in back that is chronic has not worsened since decreasing the gabapentin dose.  She is complaining of ongoing tightness in her chest since the chemical exposure at work (see prior visit, related to breathing in the smell of cleaning chemicals in the bathroom at work). She used the inhaler a few times and report it didn't help.  Still sensitive to chemical smells at work (a different cleaner, even sensitive to the Pine[employer]). She wants to get to the bottom of this. She doesn't want a flu  shot until she knows what is going on.  She previously declined oral steroids, reporting that she doesn't do well on them, doesn't like how she feels (so just given albuterol trial, and reports no benefit though hasn't used it recently).  She reports having more rectal bleeding over the last 2 months.  She has h/o fissures, and hemorrhoids.  She had banding done about 7 years ago (possibly Dr. Earlean Shawl).  She had some sort of surgery and Botox injections by someone else after that. Last colonoscopy was 9-10 years ago (chart reviewed--we have the date of 03/2010, done in [place], but no actual report; she says that her GYN told her she wasn't due until NEXT July--perhaps GYN has the actual report and we have the incorrect date). She has had persistent rectal bleeding, despite the above treatments, but notes that it is worse recently. Bleeding isn't heavy, bowel movements aren't painful. She is noting small amounts of blood while wiping, even when didn't have a bowel movement.  Bowel movements are normal, firm (not hard, not soft), not painful. If stools are too soft, they irritate her more, so cut back on the dose of Miralax to just 1/2 capful daily..  She is a little worried also about her blood pressure.  She has seen some borderline high values.  It was 130/90 at church recently. She cut down on sodium in her diet for the last few weeks. She noted the swelling in her legs improved even prior to  that.  PMH, [place], [place] reviewed  Outpatient Encounter Medications as of 07/04/2020  Medication Sig Note  . cholecalciferol (VITAMIN D3) 25 MCG (1000 UNIT) tablet Take 1,000 Units by mouth daily. 02/04/2020: 3 times a week  . cyclobenzaprine (FLEXERIL) 10 MG tablet TAKE 1 TABLET BY MOUTH THREE TIMES A DAY AS NEEDED FOR MUSCLE SPASMS 07/04/2020: Uses prn, a few nights ago  . gabapentin (NEURONTIN) 100 MG capsule TAKE 1 MID-DAY, INCREASING UP TO 3 PILLS, AS DIRECTED (IN ADDITION TO THE 300MG  CAPSULE)  07/04/2020: 1-2 at bedtime only  . norethindrone-ethinyl estradiol (FEMHRT 1/5) 1-5 MG-MCG TABS Take 1 tablet by mouth daily.   . polyethylene glycol (MIRALAX / GLYCOLAX) packet Take 6 g by mouth daily.  12/23/2019: 1/2 capful daily  . albuterol (VENTOLIN HFA) 108 (90 Base) MCG/ACT inhaler Inhale 2 puffs into the lungs every 6 (six) hours as needed for wheezing or shortness of breath. (Patient not taking: Reported on 07/04/2020)   . estradiol (ESTRACE) 0.5 MG tablet estradiol 0.5 mg tablet  Take 1 tablet every day by oral route. (Patient not taking: Reported on 03/07/2020)   . traMADol (ULTRAM) 50 MG tablet TAKE 1 TABLET BY MOUTH EVERY 6 TO 8 HOURS AS NEEDED FOR PAIN (Patient not taking: Reported on 12/23/2019) 12/23/2019: Uses very infrequently for severe pain  . [DISCONTINUED] benzonatate (TESSALON) 200 MG capsule Take 1 capsule (200 mg total) by mouth 3 (three) times daily as needed for cough.    No facility-administered encounter medications on file as of 07/04/2020.   Allergies  Allergen Reactions  . Aspirin   . Ibuprofen Other (See Comments)   ROS: no fever, chills, URI symptoms.  She has ongoing chest tightness and dry cough x months.  No nausea, vomiting, diarrhea.  +rectal bleeding per HPI.  No urinary complaints.  Chronic back pain, no radiculopathy.  +night sweats per HPI, with recent hot flashes during the day also. No weakness. No significant weight loss (just a few pounds of intentional weight loss). Edema resolved. See HPI   BP (!) 150/90   Pulse 84   Ht 5\' 8"  (1.727 m)   Wt 182 lb 12.8 oz (82.9 kg)   BMI 27.79 kg/m    Wt Readings from Last 3 Encounters:  07/04/20 182 lb 12.8 oz (82.9 kg)  03/07/20 184 lb (83.5 kg)  02/15/20 184 lb 6.4 oz (83.6 kg)   Pleasant female, frequently having dry cough through visit, especially with any deep breath.  She is speaking comfortably, and in no acute distress HEENT: conjunctiva and sclera are clear, EOMI. Wearing mask Neck: no  lymphadenopathy, thyromegaly or mass Heart: regular rate and rhythm, no murmur Lungs: clear bilaterally.  She coughed every time she tried to take a deep breath.  With shallow breaths, no wheezing noted. No rales, ronchi Back: no CVA tenderness Abdomen: soft, nontender, no mass Extremities: no edema Rectal:  There are some external hemorrhoids noted, not particularly inflamed, not bleeding.  There is a fissure noted at 7 o'clock position, not actively bleeding.  There is also evidence of a healed fissure at 12 o'clock Non-irritated tags Psych: mildly anxious, mainly showing frustration.  Normal affect, hygiene, grooming. Skin: normal turgor, no visible rashes.   ASSESSMENT/PLAN:  Unexplained night sweats - poss related to COVID vaccine? Ddx reviewed with patient. Will check labs, CXR to evaluate for other causes - Plan: CBC with Differential/Platelet, VITAMIN D 25 Hydroxy (Vit-D Deficiency, Fractures), Follicle Stimulating Hormone, QuantiFERON-TB Gold Plus  Chest tightness - related  to chemical inhalation months ago. Appears to have RAD component--rec re-try albuterol prn. Declines steroids--willing to try inhaled. Arnuity sample - Plan: DG Chest 2 View  Hot flashes - Plan: Follicle Stimulating Hormone  Cough - Plan: DG Chest 2 View  Rectal bleeding - fissures on exam; cannot r/o intermittent issues related to hemorrhoids, not active currently  Anal fissure - recurrent, despite treatments. Declined diltiazem gel. Rec softer stools, use moist wipes to avoid irritaton. f/u with GI if not resolving  Elevated blood pressure reading - got higher during visit, component of anxiety/white coat. Low Na diet, monitor/record BP. f/u in 4 weeks with list  CXR CBC, Quant Gold, FSH, D  She declines re-trying diltiazem gel for anal fissures. Rec increasing miralax for softer stools (and use moist wipes to avoid irritation).  Will check CBC to ensure no significant bleeding has occurred. To f/u with  GI for ongoing issues, for additional treatments. Unsure of exactly when next colonoscopy due, since we do not have the report from the GI in [place] (she can confirm if was in 2011 vs 2012).  CXR will be helpful in evaluating her cough, chest tightness and night sweats. She declined oral/IM steroids. Willing to try inhaled steroid. Arnuity 200 sample given. Instructed on proper use. She should also re-try albuterol, as it really sounds as though she has RAD component. We discussed doing spirometry, but due to time limitations, we held off on doing this today, and instead recommend that if she doesn't get improvement from the Arnuity and albuterol, that we will refer her to pulmonary (and they can do full PFTs as part of her eval). Flu shot was encouraged, but she refuses this "until her respiratory issues are clear".  FTF time 40-45 mins, plus additional time in chart review and documentation.   Continue low sodium diet. Monitor blood pressure elsewhere. Bring the list and follow-up in 4 weeks if persistently over 135-140/85-90. On the log, document factors which can affect blood pressure--pain scores, headaches, dizziness, high salt foods.  Go to [place] for chest x-ray. Try the Arnuity inhaler.  1 inhalation once daily (2 week supply was given). Be sure to rinse mouth after using. If your pulmonary symptoms do not resolve, let us know and we will refer you to pulmonology.  You have another anal fissure as cause for your bleeding (as well as non-bleeding external hemorrhoids). Continue the Miralax, consider using somewhat more, so that stools are softer (and use a wet cloth to clean). Since other treatments have not been effective, if this doesn't help, I suggest following up with GI.   I don't have the actual documents for your last colonoscopy (just a date of 03/2010, at [facility]), so it is possible the date it wrong.  You can see if your GYN has those records, and name  of the doctor.

## 2020-07-04 ENCOUNTER — Encounter: Payer: Self-pay | Admitting: Family Medicine

## 2020-07-04 ENCOUNTER — Ambulatory Visit: Payer: BC Managed Care – PPO | Admitting: Physician Assistant

## 2020-07-04 ENCOUNTER — Encounter: Payer: Self-pay | Admitting: *Deleted

## 2020-07-04 ENCOUNTER — Ambulatory Visit
Admission: RE | Admit: 2020-07-04 | Discharge: 2020-07-04 | Disposition: A | Discharge status: Home / Self Care | Payer: BC Managed Care – PPO | Source: Ambulatory Visit | Attending: Family Medicine | Admitting: Family Medicine

## 2020-07-04 ENCOUNTER — Ambulatory Visit: Payer: Self-pay

## 2020-07-04 ENCOUNTER — Telehealth: Payer: Self-pay | Admitting: Family Medicine

## 2020-07-04 ENCOUNTER — Ambulatory Visit: Payer: BC Managed Care – PPO | Admitting: Family Medicine

## 2020-07-04 ENCOUNTER — Other Ambulatory Visit: Payer: Self-pay

## 2020-07-04 ENCOUNTER — Encounter: Payer: Self-pay | Admitting: Physician Assistant

## 2020-07-04 VITALS — BP 150/90 | HR 84 | Ht 68.0 in | Wt 182.8 lb

## 2020-07-04 DIAGNOSIS — R059 Cough, unspecified: Secondary | ICD-10-CM

## 2020-07-04 DIAGNOSIS — M25561 Pain in right knee: Secondary | ICD-10-CM | POA: Diagnosis not present

## 2020-07-04 DIAGNOSIS — R0789 Other chest pain: Secondary | ICD-10-CM

## 2020-07-04 DIAGNOSIS — R61 Generalized hyperhidrosis: Secondary | ICD-10-CM | POA: Diagnosis not present

## 2020-07-04 DIAGNOSIS — K625 Hemorrhage of anus and rectum: Secondary | ICD-10-CM

## 2020-07-04 DIAGNOSIS — R232 Flushing: Secondary | ICD-10-CM | POA: Diagnosis not present

## 2020-07-04 DIAGNOSIS — R030 Elevated blood-pressure reading, without diagnosis of hypertension: Secondary | ICD-10-CM

## 2020-07-04 DIAGNOSIS — K602 Anal fissure, unspecified: Secondary | ICD-10-CM

## 2020-07-04 IMAGING — DX DG CHEST 2V
2 series · 2 of 2 positions shown · non-contrast
Comparison: None.

CLINICAL DATA: Chest tightness and cough.

EXAM:
CHEST - 2 VIEW

[dg chest 2 view (1 of 2)]
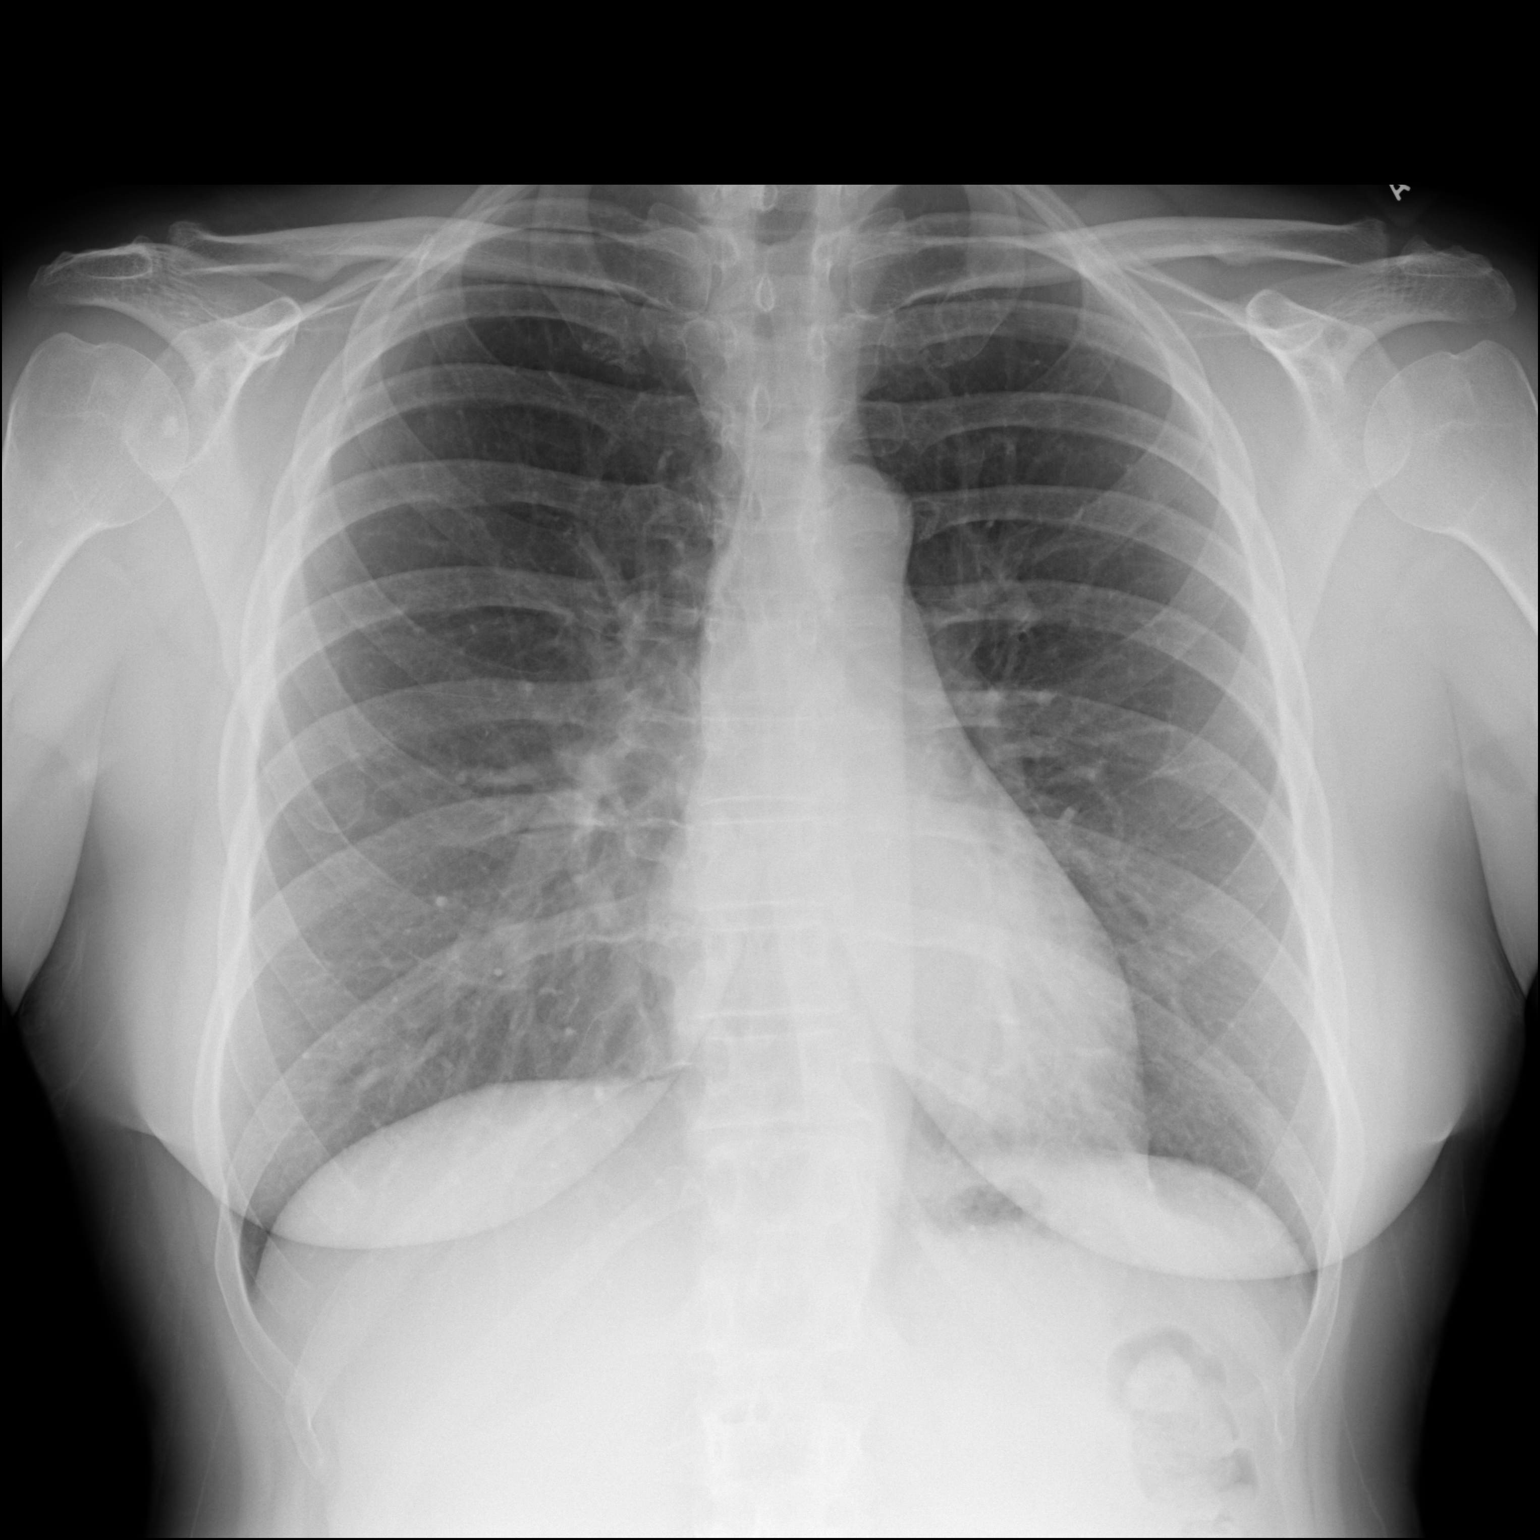

[dg chest 2 view (2 of 2)]
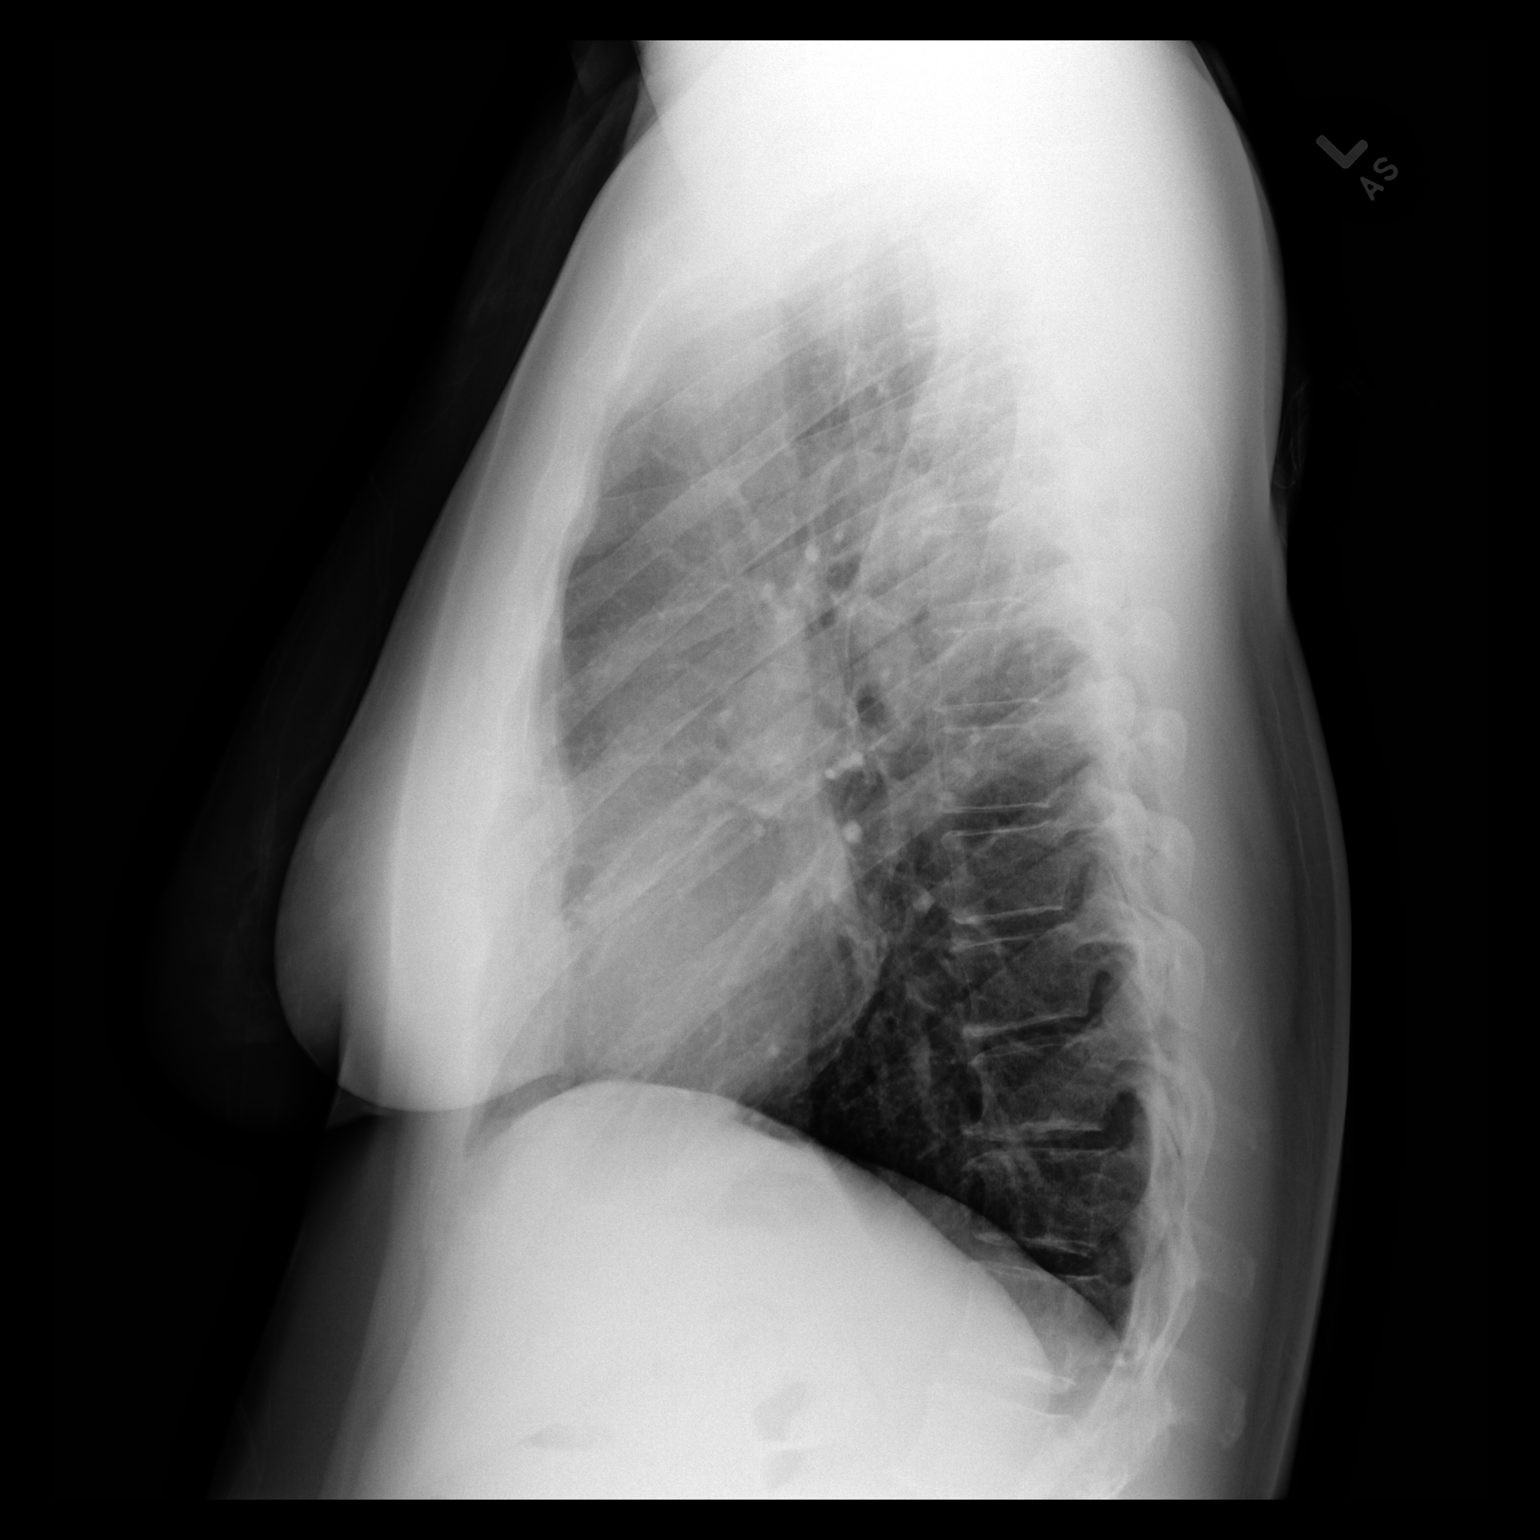

[2 of 2 positions shown; findings below may reference images not displayed]

FINDINGS: The cardiomediastinal silhouette is within normal limits. The lungs
are well inflated and clear. There is no evidence of pleural
effusion or pneumothorax. No acute osseous abnormality is
identified.
IMPRESSION: No active cardiopulmonary disease.

## 2020-07-04 NOTE — Patient Instructions (Addendum)
  Continue low sodium diet. Monitor blood pressure elsewhere. Bring the list and follow-up in 4 weeks if persistently over 135-140/85-90. On the log, document factors which can affect blood pressure--pain scores, headaches, dizziness, high salt foods.  Go to [place] for chest x-ray. Try the Arnuity inhaler.  1 inhalation once daily (2 week supply was given). Be sure to rinse mouth after using. I also recommend using the albuterol inhaler when feeling tight, as you seem to have reactive airways (coughing with deep breath, etc).  If your pulmonary symptoms do not resolve, let us know and we will refer you to pulmonology.  You have another anal fissure as cause for your bleeding (as well as non-bleeding external hemorrhoids). Continue the Miralax, consider using somewhat more, so that stools are softer (and use a wet cloth to clean). Since other treatments have not been effective, if this doesn't help, I suggest following up with GI.   I don't have the actual documents for your last colonoscopy (just a date of 03/2010, at [facility]), so it is possible the date it wrong.  You can see if your GYN has those records, and name of the doctor.

## 2020-07-04 NOTE — Telephone Encounter (Signed)
Received requested records from [facility] 

## 2020-07-04 NOTE — Progress Notes (Addendum)
Office Visit Note   Patient: Barbara Oconnor           Date of Birth: 09-01-1966           MRN: [id number] Visit Date: 07/04/2020              Requested by: Rita Ohara, MD [address] [place],  [place] [address] PCP: Rita Ohara, MD   Assessment & Plan: Visit Diagnoses:  1. Acute pain of right knee     Plan: Discussed quad strengthening exercises with her particularly strengthening her VMO.  Also discussed knee friendly exercises with her at length.  We will see her back if she does not improve in the next month with quad strengthening exercises.  Would consider a cortisone injection in the right knee.  She states she has had some cortisone injections that caused no reactions and others which she has had worsening pain with.  Questions were encouraged and answered at length.  Follow-Up Instructions: Return if symptoms worsen or fail to improve.   Orders:  Orders Placed This Encounter  Procedures  . XR Knee 1-2 Views Right   No orders of the defined types were placed in this encounter.     Procedures: No procedures performed   Clinical Data: No additional findings.   Subjective: Chief Complaint  Patient presents with  . Right Knee - Pain    HPI Barbara Oconnor 54 year old female who comes in with a new complaint today of right knee pain.  Pain began in September.  Started after walking more for exercise.  Pains inferior medial patellar region.  She states she has tenderness all the time and dull achy sensation.  No  catching, locking, painful popping in the knee.  She notes that the knee feels like it could give way especially when going up steps.  Denies any swelling.  She has tried ice and Tylenol.  She is unable to take NSAIDs due to the fact that she she states these cause her to have increased inflammation.  She denies any prior knee surgery or injury to the right knee.  Review of Systems  See HPI  Objective: Vital Signs: There were no vitals taken for  this visit.  Physical Exam Constitutional:      Appearance: She is not ill-appearing or diaphoretic.  Neurological:     Mental Status: She is alert and oriented to person, place, and time.  Psychiatric:        Mood and Affect: Mood normal.     Ortho Exam Bilateral knees good range of motion.  Significant patellofemoral crepitus of the right knee with past range of motion.  No tenderness along medial lateral joint line of either knee no instability valgus varus stressing of either knee McMurray's is negative bilaterally.  No abnormal warmth erythema or effusion of either knee. Specialty Comments:  No specialty comments available.  Imaging: AP lateral views right knee: No acute fractures no bony abnormalities.  Knee is well located.  Mild patellofemoral changes.  Otherwise knee is overall well-preserved.    PMFS History: Patient Active Problem List   Diagnosis Date Noted  . Low back pain 10/25/2010   Past Medical History:  Diagnosis Date  . Complication of anesthesia    slow awakening  . Goiter 2002   u/s 2002, 2010 (Dr. Buddy Duty)  . H/O motion sickness   . Low back pain 10/2010  . Multiple thyroid nodules   . Rectal bleeding    anal fissure  Family History  Problem Relation Age of Onset  . Cancer Mother 17       breast cancer  . Dementia Mother   . Hyperlipidemia Father   . Hypertension Father   . Cancer Father 48       stage 4 non-hodgkins lymphoma  . Lymphoma Father   . Bipolar disorder Sister   . Migraines Daughter   . Cancer Maternal Aunt        thyroid cancer  . Cancer Paternal Uncle        lymphatic  . Diabetes Paternal Grandmother   . Heart disease Paternal Grandfather     Past Surgical History:  Procedure Laterality Date  . COLONOSCOPY  03/2010   Jule Ser); benign polyp, told to repeat 10 years  . EXAMINATION UNDER ANESTHESIA N/A 11/04/2014   Procedure: EXAM UNDER ANESTHESIA;  Surgeon: Leighton Ruff, MD;  Location: [facility];   Service: General;  Laterality: N/A;  . hemorrhoid banding    . SPHINCTEROTOMY N/A 11/04/2014   Procedure: CHEMICAL SPHINCTEROTOMY WITH BOTOX/ fisurectomy;  Surgeon: Leighton Ruff, MD;  Location: [facility];  Service: General;  Laterality: N/A;  . TEAR DUCT PROBING  2006   left  . TONSILLECTOMY  1986   Social History   Occupational History  . Occupation: teaches pilates  . 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 Smoking status: Never Smoker  . Smokeless tobacco: Never Used  Vaping Use  . Vaping Use: Never used  Substance and Sexual Activity  . Alcohol use: Yes    Comment: 3-4/week (wine) or less  . Drug use: No  . Sexual activity: Yes    Partners: Male    Birth control/protection: Surgical    Comment: husband had vasectomy

## 2020-07-05 ENCOUNTER — Encounter: Payer: Self-pay | Admitting: Family Medicine

## 2020-07-05 MED ORDER — ARNUITY ELLIPTA 200 MCG/ACT IN AEPB: 1.0000 | INHALATION_SPRAY | Freq: Every day | RESPIRATORY_TRACT | 0 refills | Status: DC

## 2020-07-06 LAB — CBC WITH DIFFERENTIAL/PLATELET
Basophils Absolute: 0 10*3/uL (ref 0.0–0.2)
Basos: 1 %
EOS (ABSOLUTE): 0.1 10*3/uL (ref 0.0–0.4)
Eos: 1 %
Hematocrit: 44.5 % (ref 34.0–46.6)
Hemoglobin: 15 g/dL (ref 11.1–15.9)
Immature Grans (Abs): 0 10*3/uL (ref 0.0–0.1)
Immature Granulocytes: 0 %
Lymphocytes Absolute: 1.3 10*3/uL (ref 0.7–3.1)
Lymphs: 25 %
MCH: 32.3 pg (ref 26.6–33.0)
MCHC: 33.7 g/dL (ref 31.5–35.7)
MCV: 96 fL (ref 79–97)
Monocytes Absolute: 0.3 10*3/uL (ref 0.1–0.9)
Monocytes: 5 %
Neutrophils Absolute: 3.5 10*3/uL (ref 1.4–7.0)
Neutrophils: 68 %
Platelets: 236 10*3/uL (ref 150–450)
RBC: 4.65 x10E6/uL (ref 3.77–5.28)
RDW: 11.9 % (ref 11.7–15.4)
WBC: 5.1 10*3/uL (ref 3.4–10.8)

## 2020-07-06 LAB — VITAMIN D 25 HYDROXY (VIT D DEFICIENCY, FRACTURES): Vit D, 25-Hydroxy: 59.1 ng/mL (ref 30.0–100.0)

## 2020-07-06 LAB — QUANTIFERON-TB GOLD PLUS
QuantiFERON Mitogen Value: >10 IU/mL
QuantiFERON Nil Value: 0 IU/mL
QuantiFERON TB1 Ag Value: 0 IU/mL
QuantiFERON TB2 Ag Value: 0 IU/mL
QuantiFERON-TB Gold Plus: NEGATIVE

## 2020-07-06 LAB — FOLLICLE STIMULATING HORMONE: FSH: 39.1 m[IU]/mL

## 2020-07-27 ENCOUNTER — Telehealth: Payer: Self-pay

## 2020-07-27 DIAGNOSIS — M25561 Pain in right knee: Secondary | ICD-10-CM

## 2020-07-27 NOTE — Telephone Encounter (Signed)
Patient called she wants your or Artis Delay  to give her a call back regarding her right knee she is wondering if she could start pt or receive a injection.call back:[phone]

## 2020-07-27 NOTE — Telephone Encounter (Signed)
Would recommend  PT for straightening HEP Modalities

## 2020-07-27 NOTE — Telephone Encounter (Signed)
THIS IS YOUR PT

## 2020-07-28 NOTE — Telephone Encounter (Signed)
Tried calling pt to inform but no answer and no way to leave voice mail

## 2020-07-28 NOTE — Addendum Note (Signed)
Addended by: Robyne Peers on: 07/28/2020 09:53 AM   Modules accepted: Orders

## 2020-07-28 NOTE — Telephone Encounter (Signed)
PT order placed in chart 

## 2020-08-03 ENCOUNTER — Ambulatory Visit: Payer: BC Managed Care – PPO | Attending: Physician Assistant

## 2020-08-03 ENCOUNTER — Other Ambulatory Visit: Payer: Self-pay

## 2020-08-03 DIAGNOSIS — M25661 Stiffness of right knee, not elsewhere classified: Secondary | ICD-10-CM

## 2020-08-03 DIAGNOSIS — R2689 Other abnormalities of gait and mobility: Secondary | ICD-10-CM | POA: Diagnosis not present

## 2020-08-03 DIAGNOSIS — M6281 Muscle weakness (generalized): Secondary | ICD-10-CM | POA: Diagnosis not present

## 2020-08-03 DIAGNOSIS — M25561 Pain in right knee: Secondary | ICD-10-CM | POA: Diagnosis not present

## 2020-08-03 NOTE — Therapy (Signed)
[facility] [address], [place], [place], [address] Phone: [phone]   Fax:  [phone]  Physical Therapy Treatment  Patient Details  Name: Barbara Oconnor MRN: [id number] Date of Birth: 24-Jun-1966 Referring Provider (PT): Erskine Emery, [facility]   Encounter Date: 08/03/2020   PT End of Session - 08/03/20 1013    Visit Number 1    Date for PT Re-Evaluation 10/26/20    Authorization Type BCBS    PT Start Time 0932    PT Stop Time 1324    PT Time Calculation (min) 42 min    Activity Tolerance Patient tolerated treatment well    Behavior During Therapy [facility] for tasks assessed/performed           Past Medical History:  Diagnosis Date  . Complication of anesthesia    slow awakening  . Goiter 2002   u/s 2002, 2010 (Dr. Buddy Duty)  . H/O motion sickness   . Low back pain 10/2010  . Multiple thyroid nodules   . Rectal bleeding    anal fissure    Past Surgical History:  Procedure Laterality Date  . COLONOSCOPY  03/2010   Jule Ser); benign polyp, told to repeat 10 years  . EXAMINATION UNDER ANESTHESIA N/A 11/04/2014   Procedure: EXAM UNDER ANESTHESIA;  Surgeon: Leighton Ruff, MD;  Location: [facility];  Service: General;  Laterality: N/A;  . hemorrhoid banding    . SPHINCTEROTOMY N/A 11/04/2014   Procedure: CHEMICAL SPHINCTEROTOMY WITH BOTOX/ fisurectomy;  Surgeon: Leighton Ruff, MD;  Location: [place];  Service: General;  Laterality: N/A;  . TEAR DUCT PROBING  2006   left  . TONSILLECTOMY  1986    There were no vitals filed for this visit.   Subjective Assessment - 08/03/20 0935    Subjective Pt presents to PT with Rt knee pain that began 05/2020 without incident or injury.  Pt reports that she hikes a lot and went hiking around the time that pain began.  Pt had x-ray that showed some patellofemoral changes but otherwise negative result.  Pt exercises at the gym including  walking or biking and a 30 minute strength circuit.    How long can you walk comfortably? slow gait with antalgia- walking is painful    Diagnostic tests x-ray: patellofemoral  crepitus    Patient Stated Goals reduce Rt knee pain, walk up/down steps normally, exercise without limitation    Currently in Pain? Yes    Pain Score 6    up to 10/10   Pain Location Knee    Pain Orientation Right    Pain Descriptors / Indicators Aching;Sore;Radiating    Pain Type Acute pain    Pain Onset More than a month ago    Pain Frequency Intermittent    Aggravating Factors  walking, standing, steps    Pain Relieving Factors ice, epson salt              [facility] PT Assessment - 08/03/20 0001      Assessment   Medical Diagnosis Acute pain of the Rt knee    Referring Provider (PT) Erskine Emery, [facility]    Onset Date/Surgical Date 06/23/20    Next MD Visit none scheduled     Prior Therapy none for knee      Precautions   Precautions None      Restrictions   Weight Bearing Restrictions No      Balance Screen   Has the patient fallen  in the past 6 months No    Has the patient had a decrease in activity level because of a fear of falling?  No    Is the patient reluctant to leave their home because of a fear of falling?  No      Home Environment   Living Environment Private residence    Type of [place] Two level      Prior Function   Level of Independence Independent    Vocation Full time employment    Vocation Requirements desk work    Leisure hiking      Cognition   Overall Cognitive Status Within Functional Limits for tasks assessed      Observation/Other Assessments   Focus on Therapeutic Outcomes (FOTO)  32% limitation      Posture/Postural Control   Posture/Postural Control Postural limitations    Postural Limitations Weight shift left      ROM / Strength   AROM / PROM / Strength AROM;PROM;Strength      AROM   Overall AROM  Deficits    Overall AROM Comments  bil hip and knee A/ROM is full except Rt knee extension lacking 6 degrees with pain.  Pain with Rt knee flexion at end range.      PROM   Overall PROM  Due to pain    Overall PROM Comments not able to achieve full Rt knee extension due to pain.       Strength   Overall Strength Deficits    Overall Strength Comments Lt hip and knee 5/5    Strength Assessment Site Hip;Knee    Right/Left Hip Right    Right Hip Flexion 4-/5   pain with testing   Right Hip Extension 4+/5    Right Hip ABduction 4-/5    Right/Left Knee Right    Right Knee Flexion 5/5    Right Knee Extension 4-/5      Palpation   Patella mobility normal with pain and crepitus    Palpation comment tender over Rt medial knee joint, Rt quad atrophy      Transfers   Transfers Independent with all Transfers      Ambulation/Gait   Ambulation/Gait Yes    Gait Pattern Step-through pattern;Decreased step length - left;Decreased step length - right;Decreased stance time - right    Stairs Yes    Stairs Assistance 7: Independent    Stair Management Technique Step to pattern    Gait Comments single leg stance Rt=Lt with mild pelvic drop on Lt with Rt single leg stance                                 PT Education - 08/03/20 1011    Education Details Access Code: [email or web address]    Person(s) Educated Patient    Methods Explanation;Demonstration;Handout    Comprehension Verbalized understanding;Returned demonstration            PT Short Term Goals - 08/03/20 0952      PT SHORT TERM GOAL #1   Title Pt will demo consistency and independent in initial HEP.    Time 6    Period Weeks    Status New    Target Date 09/14/20      PT SHORT TERM GOAL #2   Title Pt will report a 30% reduction in the frequency and intensity of Rt knee pain with standing and walking  Baseline --    Time 6    Period Weeks    Status New    Target Date 09/14/20      PT SHORT TERM GOAL #3   Title demonstrate symmetry with  ambulation on level surfaces for household distances    Baseline --    Time 6    Period Weeks    Status New    Target Date 09/14/20      PT SHORT TERM GOAL #4   Title reduce Rt LE pain and improve functional strength to ascend steps with step-over-step gait    Baseline --    Time 6    Period Weeks    Status New    Target Date 09/14/20             PT Long Term Goals - 08/03/20 0953      PT LONG TERM GOAL #1   Title be independent in advanced HEP    Time 12    Period Weeks    Status New    Target Date 10/26/20      PT LONG TERM GOAL #2   Title reduce FOTO to < or = to 32% limitation    Baseline --    Time 12    Period Weeks    Status New    Target Date 10/26/20      PT LONG TERM GOAL #3   Title reduce Rt knee pain and improve functional strength to ascend and descend steps with step-over-step gait    Baseline --    Time 12    Period Weeks    Target Date 10/26/20      PT LONG TERM GOAL #4   Title demonstrate 0 degrees of Rt knee A/ROM extension to improve symmetry with gait    Time 12    Period Weeks    Status New    Target Date 10/26/20      PT LONG TERM GOAL #5   Title report < or = to 3/10 Rt knee pain with standing and walking    Time 8    Period Weeks    Status New    Target Date 10/26/20                 Plan - 08/03/20 1102    Clinical Impression Statement Pt presents to PT with a 1.5 month history of Rt knee pain that began without significant cause.  Pt was hiking prior to the onset of pain but this is a normal activity for this patient.  Recent x-ray showed mild patellofemoral changes and otherwise unremarkable.  Pt reports 6-10/10 Rt knee pain with all standing and walking and demonstrates antalgia on level surface.  Pt negotiates steps with step-to gait pattern due to Rt knee pain.  Pt reports limitation with standing and walking due to Rt knee pain with all standing activity.  Pt with 4/5 Rt quad and hip abduction strength, strength, fair  quad set on the Rt with pain and lacking 6 degrees of Rt knee extension due to pain in this position.  Patellar mobs with mild crepitus and pain reported by pt and tenderness over Rt knee medial joint line.  Pt will benefit from skilled PT to improve quad and Rt hip stability, normalize gait and reduce pain.    Personal Factors and Comorbidities Comorbidity 1    Comorbidities LBP    Examination-Activity Limitations Locomotion Level;Squat;Stairs;Stand    Examination-Participation Restrictions Community Activity;Laundry  Stability/Clinical Decision Making Stable/Uncomplicated    Clinical Decision Making Low    Rehab Potential Excellent    PT Frequency 2x / week    PT Duration 12 weeks    PT Treatment/Interventions ADLs/Self Care Home Management;Cryotherapy;Electrical Stimulation;Moist Heat;Functional mobility Event organiser;Therapeutic activities;Therapeutic exercise;Balance training;Neuromuscular re-education;Patient/family education;Manual techniques;Passive range of motion;Taping;Vasopneumatic Device    PT Next Visit Plan review HEP, gait training, flexibility and strength of Rt LE    PT Home Exercise Plan Access Code: [email or web address]    Consulted and Agree with Plan of Care Patient           Patient will benefit from skilled therapeutic intervention in order to improve the following deficits and impairments:  Abnormal gait, Decreased activity tolerance, Decreased strength, Pain, Decreased range of motion, Difficulty walking, Decreased endurance  Visit Diagnosis: Acute pain of right knee - Plan: PT plan of care cert/re-cert  Other abnormalities of gait and mobility - Plan: PT plan of care cert/re-cert  Muscle weakness (generalized) - Plan: PT plan of care cert/re-cert  Stiffness of right knee, not elsewhere classified - Plan: PT plan of care cert/re-cert     Problem List Patient Active Problem List   Diagnosis Date Noted  . Low back pain 10/25/2010     Sigurd Sos, PT 08/03/20 11:51 AM  [facility] [address] [place], [place], [address] Phone: [phone]   Fax:  [phone]  Name: Barbara Oconnor MRN: [id number] Date of Birth: May 20, 1966

## 2020-08-03 NOTE — Patient Instructions (Signed)
Access Code: [email or web address] URL: [email or web address] Date: 08/03/2020 Prepared by: Claiborne Billings  Exercises Clamshell - 2 x daily - 7 x weekly - 2 sets - 10 reps Supine Quad Set on Towel Roll - 2 x daily - 7 x weekly - 2 sets - 5 reps - 5 hold Active Straight Leg Raise with Quad Set - 2 x daily - 7 x weekly - 2 sets - 10 reps Standing Single Leg Stance with Counter Support - 2 x daily - 7 x weekly - 1 sets - 3-4 reps - 10-15 hold

## 2020-08-05 ENCOUNTER — Ambulatory Visit: Payer: BC Managed Care – PPO | Admitting: Physical Therapy

## 2020-08-08 ENCOUNTER — Ambulatory Visit: Payer: BC Managed Care – PPO | Admitting: Physical Therapy

## 2020-08-08 ENCOUNTER — Other Ambulatory Visit: Payer: Self-pay

## 2020-08-08 ENCOUNTER — Encounter: Payer: Self-pay | Admitting: Physical Therapy

## 2020-08-08 DIAGNOSIS — M25661 Stiffness of right knee, not elsewhere classified: Secondary | ICD-10-CM | POA: Diagnosis not present

## 2020-08-08 DIAGNOSIS — M25561 Pain in right knee: Secondary | ICD-10-CM

## 2020-08-08 DIAGNOSIS — R2689 Other abnormalities of gait and mobility: Secondary | ICD-10-CM | POA: Diagnosis not present

## 2020-08-08 DIAGNOSIS — M6281 Muscle weakness (generalized): Secondary | ICD-10-CM

## 2020-08-08 NOTE — Therapy (Signed)
[facility] [address], [place], [place], [address] Phone: [phone]   Fax:  [phone]  Physical Therapy Treatment  Patient Details  Name: Barbara Oconnor MRN: [id number] Date of Birth: May 26, 1966 Referring Provider (PT): Erskine Emery, [facility]   Encounter Date: 08/08/2020   PT End of Session - 08/08/20 0846    Visit Number 2    Date for PT Re-Evaluation 10/26/20    Authorization Type BCBS    PT Start Time [phone]    PT Stop Time 0932    PT Time Calculation (min) 46 min    Activity Tolerance Patient tolerated treatment well    Behavior During Therapy [facility] for tasks assessed/performed           Past Medical History:  Diagnosis Date  . Complication of anesthesia    slow awakening  . Goiter 2002   u/s 2002, 2010 (Dr. Buddy Duty)  . H/O motion sickness   . Low back pain 10/2010  . Multiple thyroid nodules   . Rectal bleeding    anal fissure    Past Surgical History:  Procedure Laterality Date  . COLONOSCOPY  03/2010   Jule Ser); benign polyp, told to repeat 10 years  . EXAMINATION UNDER ANESTHESIA N/A 11/04/2014   Procedure: EXAM UNDER ANESTHESIA;  Surgeon: Leighton Ruff, MD;  Location: [facility];  Service: General;  Laterality: N/A;  . hemorrhoid banding    . SPHINCTEROTOMY N/A 11/04/2014   Procedure: CHEMICAL SPHINCTEROTOMY WITH BOTOX/ fisurectomy;  Surgeon: Leighton Ruff, MD;  Location: [place];  Service: General;  Laterality: N/A;  . TEAR DUCT PROBING  2006   left  . TONSILLECTOMY  1986    There were no vitals filed for this visit.   Subjective Assessment - 08/08/20 0846    Subjective Pain ranging from 4-6/10.  The HEP hurts over the last 1.5 days.    How long can you walk comfortably? slow gait with antalgia- walking is painful    Diagnostic tests x-ray: patellofemoral  crepitus    Patient Stated Goals reduce Rt knee pain, walk up/down steps normally, exercise  without limitation    Currently in Pain? Yes    Pain Score 6     Pain Location Knee    Pain Orientation Right    Pain Descriptors / Indicators Aching;Sore;Radiating    Pain Onset More than a month ago    Pain Frequency Intermittent    Aggravating Factors  walk stand, steps    Pain Relieving Factors ice, epson salt                             [facility] Adult PT Treatment/Exercise - 08/08/20 0001      Ambulation/Gait   Ambulation/Gait Yes    Gait Comments PT cued closed chain hip stab activtion on Rt in stance phase      Self-Care   Self-Care Other Self-Care Comments    Other Self-Care Comments  neoprene sleeve for Rt knee, Pt allergic to adhesive so no KT tape      Exercises   Exercises Knee/Hip      Knee/Hip Exercises: Standing   Lateral Step Up Right;1 set;10 reps;Step Height: 2";Hand Hold: 2    Other Standing Knee Exercises weight shifts at counter x 10 reps, hold Rt weight shift x 3 sec, stand tall through hip      Knee/Hip Exercises: Seated   Long Arc [employer]  Strengthening;Right;1 set;10 reps    Long [employer] Limitations with ball squeeze      Knee/Hip Exercises: Supine   Short Arc [employer] Strengthening;Right;2 sets;10 reps    Short Arc [employer] Limitations with ball squeeze    Straight Leg Raises Right;2 sets;5 reps    Straight Leg Raises Limitations SAQ over bolster, then SLR      Modalities   Modalities Cryotherapy      Cryotherapy   Number Minutes Cryotherapy 10 Minutes    Cryotherapy Location Knee    Type of Cryotherapy Ice pack                  PT Education - 08/08/20 0928    Education Details SAQ/LAQ with ball squeeze, lateral step up 2"    Person(s) Educated Patient    Methods Explanation;Demonstration;Handout    Comprehension Verbalized understanding;Returned demonstration            PT Short Term Goals - 08/03/20 0952      PT SHORT TERM GOAL #1   Title Pt will demo consistency and independent in initial HEP.    Time 6     Period Weeks    Status New    Target Date 09/14/20      PT SHORT TERM GOAL #2   Title Pt will report a 30% reduction in the frequency and intensity of Rt knee pain with standing and walking    Baseline --    Time 6    Period Weeks    Status New    Target Date 09/14/20      PT SHORT TERM GOAL #3   Title demonstrate symmetry with ambulation on level surfaces for household distances    Baseline --    Time 6    Period Weeks    Status New    Target Date 09/14/20      PT SHORT TERM GOAL #4   Title reduce Rt LE pain and improve functional strength to ascend steps with step-over-step gait    Baseline --    Time 6    Period Weeks    Status New    Target Date 09/14/20             PT Long Term Goals - 08/03/20 0953      PT LONG TERM GOAL #1   Title be independent in advanced HEP    Time 12    Period Weeks    Status New    Target Date 10/26/20      PT LONG TERM GOAL #2   Title reduce FOTO to < or = to 32% limitation    Baseline --    Time 12    Period Weeks    Status New    Target Date 10/26/20      PT LONG TERM GOAL #3   Title reduce Rt knee pain and improve functional strength to ascend and descend steps with step-over-step gait    Baseline --    Time 12    Period Weeks    Target Date 10/26/20      PT LONG TERM GOAL #4   Title demonstrate 0 degrees of Rt knee A/ROM extension to improve symmetry with gait    Time 12    Period Weeks    Status New    Target Date 10/26/20      PT LONG TERM GOAL #5   Title report < or = to 3/10 Rt knee pain with  standing and walking    Time 8    Period Weeks    Status New    Target Date 10/26/20                 Plan - 08/08/20 1244    Clinical Impression Statement Pt arrived with heightened pain over past 1.5 days. Some of the HEP cause her pain.  Pt continues to have tenderness along medial aspect of joint line and VMO lag in activation.  She was better able to recruit VMO and perform several ther ex without pain  with addition of ball squeeze.  She has some proximal hip weakness on Rt which PT cued with lateral step ups, weight shifts and within gait.  PT updated HEP to reflect these changes.  PT also discussed shoe wear to avoid heels when possible to avoid anterior pressure and demand increase on knee when in heels.  Pt unable to tolerate adhesive so KT tape not used - Neoprene sleeve recommended if knee pain continues to flare up.  Continue along POC with ongoing assessment of ther ex tolerance.    Rehab Potential Excellent    PT Frequency 2x / week    PT Duration 12 weeks    PT Treatment/Interventions ADLs/Self Care Home Management;Cryotherapy;Electrical Stimulation;Moist Heat;Functional mobility Event organiser;Therapeutic activities;Therapeutic exercise;Balance training;Neuromuscular re-education;Patient/family education;Manual techniques;Passive range of motion;Taping;Vasopneumatic Device    PT Next Visit Plan consider Russion stim to Rt VMO if needed, work on hip abd strength in open/closed chain, review SAQ/LAQ with ball squeeze and lateral step ups 2", try TKEs, did Pt get Neoprene sleeve?    PT Home Exercise Plan Access Code: [email or web address]    Consulted and Agree with Plan of Care Patient           Patient will benefit from skilled therapeutic intervention in order to improve the following deficits and impairments:     Visit Diagnosis: Acute pain of right knee  Other abnormalities of gait and mobility  Muscle weakness (generalized)  Stiffness of right knee, not elsewhere classified     Problem List Patient Active Problem List   Diagnosis Date Noted  . Low back pain 10/25/2010    Baruch Merl, PT 08/08/20 12:54 PM   [facility] [address] [place], [place], [address] Phone: [phone]   Fax:  [phone]  Name: Barbara Oconnor MRN: [id number] Date of Birth: 08-06-1966

## 2020-08-08 NOTE — Patient Instructions (Signed)
Access Code: [email or web address] URL: [email or web address] Date: 08/08/2020 Prepared by: Venetia Night Maryella Abood  Exercises Clamshell - 2 x daily - 7 x weekly - 2 sets - 10 reps Supine Quad Set on Towel Roll - 2 x daily - 7 x weekly - 2 sets - 5 reps - 5 hold Standing Single Leg Stance with Counter Support - 2 x daily - 7 x weekly - 1 sets - 3-4 reps - 10-15 hold Supine Knee Extension Strengthening - 1 x daily - 7 x weekly - 3 sets - 10 reps Active Straight Leg Raise with Quad Set - 2 x daily - 7 x weekly - 2 sets - 10 reps Seated Long Arc Quad with Hip Adduction - 1 x daily - 7 x weekly - 3 sets - 5 reps Lateral Step Up - 1 x daily - 7 x weekly - 3 sets - 5 reps

## 2020-08-10 ENCOUNTER — Ambulatory Visit: Payer: BC Managed Care – PPO | Admitting: Physical Therapy

## 2020-08-10 ENCOUNTER — Other Ambulatory Visit: Payer: Self-pay

## 2020-08-10 ENCOUNTER — Encounter: Payer: Self-pay | Admitting: Physical Therapy

## 2020-08-10 DIAGNOSIS — M25561 Pain in right knee: Secondary | ICD-10-CM | POA: Diagnosis not present

## 2020-08-10 DIAGNOSIS — R2689 Other abnormalities of gait and mobility: Secondary | ICD-10-CM | POA: Diagnosis not present

## 2020-08-10 DIAGNOSIS — M25661 Stiffness of right knee, not elsewhere classified: Secondary | ICD-10-CM

## 2020-08-10 DIAGNOSIS — M6281 Muscle weakness (generalized): Secondary | ICD-10-CM

## 2020-08-10 NOTE — Therapy (Signed)
[facility] [address], [place], [place], [address] Phone: [phone]   Fax:  [phone]  Physical Therapy Treatment  Patient Details  Name: Barbara Oconnor MRN: [id number] Date of Birth: 12-02-1965 Referring Provider (PT): Erskine Emery, [facility]   Encounter Date: 08/10/2020   PT End of Session - 08/10/20 0818    Visit Number 3    Date for PT Re-Evaluation 10/26/20    Authorization Type BCBS    PT Start Time 0800    Activity Tolerance Patient tolerated treatment well;Patient limited by pain    Behavior During Therapy [facility] for tasks assessed/performed           Past Medical History:  Diagnosis Date  . Complication of anesthesia    slow awakening  . Goiter 2002   u/s 2002, 2010 (Dr. Buddy Duty)  . H/O motion sickness   . Low back pain 10/2010  . Multiple thyroid nodules   . Rectal bleeding    anal fissure    Past Surgical History:  Procedure Laterality Date  . COLONOSCOPY  03/2010   Jule Ser); benign polyp, told to repeat 10 years  . EXAMINATION UNDER ANESTHESIA N/A 11/04/2014   Procedure: EXAM UNDER ANESTHESIA;  Surgeon: Leighton Ruff, MD;  Location: [facility];  Service: General;  Laterality: N/A;  . hemorrhoid banding    . SPHINCTEROTOMY N/A 11/04/2014   Procedure: CHEMICAL SPHINCTEROTOMY WITH BOTOX/ fisurectomy;  Surgeon: Leighton Ruff, MD;  Location: [facility];  Service: General;  Laterality: N/A;  . TEAR DUCT PROBING  2006   left  . TONSILLECTOMY  1986    There were no vitals filed for this visit.   Subjective Assessment - 08/10/20 0800    Subjective I woke up 2/10, then did HEP and it went to 4/10, and driving here it started radiating up into my thigh    How long can you walk comfortably? slow gait with antalgia- walking is painful    Diagnostic tests x-ray: patellofemoral  crepitus    Patient Stated Goals reduce Rt knee pain, walk up/down steps normally,  exercise without limitation    Currently in Pain? Yes    Pain Score 6     Pain Location Knee    Pain Orientation Right;Anterior    Pain Descriptors / Indicators Stabbing    Pain Type Acute pain    Pain Radiating Towards anterior thigh    Pain Onset More than a month ago    Pain Frequency Intermittent    Aggravating Factors  walk, stand, steps, exercises              [facility] PT Assessment - 08/10/20 0001      Observation/Other Assessments-Edema    Edema Circumferential      Circumferential Edema   Circumferential - Right 42cm   midpatellar   Circumferential - Left  41cm      Palpation   Palpation comment taut band rectus femoris with trigger point, Rt                         [facility] Adult PT Treatment/Exercise - 08/10/20 0001      Knee/Hip Exercises: Stretches   Quad Stretch Right;1 rep;30 seconds    Quad Stretch Limitations standing, some knee pain end of stretch      Knee/Hip Exercises: Machines for Strengthening   Cybex Leg Press 60lb bil LEs with ball squeeze 2x10      Knee/Hip  Exercises: Standing   Terminal Knee Extension Strengthening;Right;Theraband;10 reps    Theraband Level (Terminal Knee Extension) Level 1 (Yellow)    Terminal Knee Extension Limitations mild knee pain with this    Lateral Step Up Right;1 set;5 reps;Hand Hold: 2;Step Height: 2"    Forward Step Up Right;1 set;5 reps;Hand Hold: 2;Step Height: 2"    Functional Squat 1 set;10 reps    Functional Squat Limitations with red hip abd band, single UE support      Knee/Hip Exercises: Supine   Quad Sets Strengthening;Right    Quad Sets Limitations with stim during 10 sec on/20 off, supervised stim by PT      Modalities   Modalities Electrical Stimulation      Cryotherapy   Number Minutes Cryotherapy 10 Minutes    Cryotherapy Location Knee   medium compression coldest setting   Type of Cryotherapy Other (comment)   game ready Rt knee     Electrical Stimulation   Electrical  Stimulation Location Rt knee    Electrical Stimulation Action Engineer, petroleum Parameters 10/20 duty cycle, 2ma    Electrical Stimulation Goals Strength;Neuromuscular facilitation      Manual Therapy   Manual Therapy Soft tissue mobilization    Soft tissue mobilization Rt rectus femoris                    PT Short Term Goals - 08/03/20 4765      PT SHORT TERM GOAL #1   Title Pt will demo consistency and independent in initial HEP.    Time 6    Period Weeks    Status New    Target Date 09/14/20      PT SHORT TERM GOAL #2   Title Pt will report a 30% reduction in the frequency and intensity of Rt knee pain with standing and walking    Baseline --    Time 6    Period Weeks    Status New    Target Date 09/14/20      PT SHORT TERM GOAL #3   Title demonstrate symmetry with ambulation on level surfaces for household distances    Baseline --    Time 6    Period Weeks    Status New    Target Date 09/14/20      PT SHORT TERM GOAL #4   Title reduce Rt LE pain and improve functional strength to ascend steps with step-over-step gait    Baseline --    Time 6    Period Weeks    Status New    Target Date 09/14/20             PT Long Term Goals - 08/03/20 0953      PT LONG TERM GOAL #1   Title be independent in advanced HEP    Time 12    Period Weeks    Status New    Target Date 10/26/20      PT LONG TERM GOAL #2   Title reduce FOTO to < or = to 32% limitation    Baseline --    Time 12    Period Weeks    Status New    Target Date 10/26/20      PT LONG TERM GOAL #3   Title reduce Rt knee pain and improve functional strength to ascend and descend steps with step-over-step gait    Baseline --    Time 12    Period Weeks  Target Date 10/26/20      PT LONG TERM GOAL #4   Title demonstrate 0 degrees of Rt knee A/ROM extension to improve symmetry with gait    Time 12    Period Weeks    Status New    Target Date 10/26/20      PT LONG  TERM GOAL #5   Title report < or = to 3/10 Rt knee pain with standing and walking    Time 8    Period Weeks    Status New    Target Date 10/26/20                 Plan - 08/10/20 8309    Clinical Impression Statement Pt arrived with heightened pain since waking which went from 2/10 to 6/10 with activity today.  She presented with 1cm circumferential edema today and increased trigger point in rectus femoris.  PT used supervised [place] stim with supine ther ex for neuro re-ed, STM of quad and game ready for management of pain and edema today.  Pt with improved pain and tolerance of ther ex following [place] stim today.  Pt was able to tolerate closed chain ther ex with maintained reduced pain of 2/10 after stim.  Continue along POC with ongoing assessment    PT Next Visit Plan reassess circumferential measurement, progress HEP to include closed chain counter squat, forward step ups, sit to stand    PT Home Exercise Plan Access Code: [email or web address]    Consulted and Agree with Plan of Care Patient           Patient will benefit from skilled therapeutic intervention in order to improve the following deficits and impairments:     Visit Diagnosis: Acute pain of right knee  Other abnormalities of gait and mobility  Muscle weakness (generalized)  Stiffness of right knee, not elsewhere classified     Problem List Patient Active Problem List   Diagnosis Date Noted  . Low back pain 10/25/2010    Baruch Merl, PT 08/10/20 8:45 AM   [facility] [address], [place], [place], [address] Phone: [phone]   Fax:  [phone]  Name: Barbara Oconnor MRN: [id number] Date of Birth: Aug 24, 1966

## 2020-08-15 ENCOUNTER — Encounter: Payer: Self-pay | Admitting: Physical Therapy

## 2020-08-15 ENCOUNTER — Ambulatory Visit: Payer: BC Managed Care – PPO | Admitting: Physical Therapy

## 2020-08-15 ENCOUNTER — Other Ambulatory Visit: Payer: Self-pay

## 2020-08-15 ENCOUNTER — Ambulatory Visit: Payer: BC Managed Care – PPO | Admitting: Physician Assistant

## 2020-08-15 ENCOUNTER — Encounter: Payer: Self-pay | Admitting: Physician Assistant

## 2020-08-15 DIAGNOSIS — M25561 Pain in right knee: Secondary | ICD-10-CM | POA: Diagnosis not present

## 2020-08-15 DIAGNOSIS — N921 Excessive and frequent menstruation with irregular cycle: Secondary | ICD-10-CM | POA: Insufficient documentation

## 2020-08-15 DIAGNOSIS — M6281 Muscle weakness (generalized): Secondary | ICD-10-CM | POA: Diagnosis not present

## 2020-08-15 DIAGNOSIS — R2689 Other abnormalities of gait and mobility: Secondary | ICD-10-CM

## 2020-08-15 DIAGNOSIS — M25661 Stiffness of right knee, not elsewhere classified: Secondary | ICD-10-CM | POA: Diagnosis not present

## 2020-08-15 DIAGNOSIS — R195 Other fecal abnormalities: Secondary | ICD-10-CM | POA: Insufficient documentation

## 2020-08-15 MED ORDER — METHYLPREDNISOLONE ACETATE 40 MG/ML IJ SUSP
40.0000 mg | INTRAMUSCULAR | Status: AC | PRN
  Administered 2020-08-15: 40 mg via INTRA_ARTICULAR

## 2020-08-15 MED ORDER — LIDOCAINE HCL 1 % IJ SOLN
3.0000 mL | INTRAMUSCULAR | Status: AC | PRN
  Administered 2020-08-15: 3 mL

## 2020-08-15 NOTE — Progress Notes (Signed)
Procedure Note  Patient: Barbara Oconnor             Date of Birth: 1966/03/24           MRN: [id number]             Visit Date: 08/15/2020 HPI: Barbara Oconnor returns today follow-up of her right knee pain.  She is requesting cortisone injection in the knee.  States overall no change in the pain she is having in her knee.  She is been going to physical therapy.  She notes grinding in the knee when going up and down stairs.  Describes sharp pain rates her pain to be 4-6 out of 10 pain most the time and 8 out of 10 pain at worst.  She denies any mechanical symptoms of the knee.  Physical exam: Right knee no abnormal warmth erythema or effusion.  Tenderness along medial joint line.  Significant patellofemoral crepitus with passive range of motion. Procedures: Visit Diagnoses:  1. Acute pain of right knee     Large Joint Inj on 08/15/2020 11:27 AM Indications: pain Details: 22 G 1.5 in needle, anterolateral approach  Arthrogram: No  Medications: 3 mL lidocaine 1 %; 40 mg methylPREDNISolone acetate 40 MG/ML Outcome: tolerated well, no immediate complications Procedure, treatment alternatives, risks and benefits explained, specific risks discussed. Consent was given by the patient. Immediately prior to procedure a time out was called to verify the correct patient, procedure, equipment, support staff and site/side marked as required. Patient was prepped and draped in the usual sterile fashion.     Plan: We will see how she does with the cortisone injection if she continues to have severe pain in the knee given her radiographs with some minimal arthritic changes of the patellofemoral joint would recommend MRI to evaluate the knee cartilage and rule out any internal derangement.  She will continue to work with physical therapy on strengthening.  Questions encouraged and answered at length.  Follow-up as  needed

## 2020-08-15 NOTE — Therapy (Signed)
[facility] [address] [place], [place], [address] Phone: [phone]   Fax:  [phone]  Physical Therapy Treatment  Patient Details  Name: Barbara Oconnor MRN: [id number] Date of Birth: April 08, 1966 Referring Provider (PT): Erskine Emery, [facility]   Encounter Date: 08/15/2020   PT End of Session - 08/15/20 1011    Visit Number 4    Date for PT Re-Evaluation 10/26/20    Authorization Type BCBS    PT Start Time 0930    PT Stop Time 1011    PT Time Calculation (min) 41 min    Activity Tolerance Patient tolerated treatment well;Patient limited by pain    Behavior During Therapy [facility] for tasks assessed/performed           Past Medical History:  Diagnosis Date  . Complication of anesthesia    slow awakening  . Goiter 2002   u/s 2002, 2010 (Dr. Buddy Duty)  . H/O motion sickness   . Low back pain 10/2010  . Multiple thyroid nodules   . Rectal bleeding    anal fissure    Past Surgical History:  Procedure Laterality Date  . COLONOSCOPY  03/2010   Jule Ser); benign polyp, told to repeat 10 years  . EXAMINATION UNDER ANESTHESIA N/A 11/04/2014   Procedure: EXAM UNDER ANESTHESIA;  Surgeon: Leighton Ruff, MD;  Location: [facility];  Service: General;  Laterality: N/A;  . hemorrhoid banding    . SPHINCTEROTOMY N/A 11/04/2014   Procedure: CHEMICAL SPHINCTEROTOMY WITH BOTOX/ fisurectomy;  Surgeon: Leighton Ruff, MD;  Location: [facility];  Service: General;  Laterality: N/A;  . TEAR DUCT PROBING  2006   left  . TONSILLECTOMY  1986    There were no vitals filed for this visit.   Subjective Assessment - 08/15/20 0932    Subjective I had a quiet weekend with pain ranging from 4-6/10.  I just got a cortisone injection and iced x 20 min.    How long can you walk comfortably? slow gait with antalgia- walking is painful    Diagnostic tests x-ray: patellofemoral  crepitus    Patient Stated  Goals reduce Rt knee pain, walk up/down steps normally, exercise without limitation    Currently in Pain? Yes    Pain Score 2     Pain Location Knee    Pain Orientation Right;Anterior    Pain Descriptors / Indicators Sharp;Tightness    Pain Type Acute pain    Pain Onset More than a month ago    Aggravating Factors  certain moveements, walk, stand, steps, exercises sometimes    Pain Relieving Factors ice, epson salt                             [facility] Adult PT Treatment/Exercise - 08/15/20 0001      Knee/Hip Exercises: Stretches   Gastroc Stretch Right;1 rep;30 seconds    Soleus Stretch Right;1 rep;30 seconds      Knee/Hip Exercises: Standing   Hip Abduction Stengthening;10 reps;Knee straight    Hip Extension Stengthening;Right;10 reps;Knee straight      Knee/Hip Exercises: Seated   Other Seated Knee/Hip Exercises heel slides Rt foot on slider x 10      Electrical Stimulation   Electrical Stimulation Location Rt knee    Printmaker Action [place]   quad sets with on phase, supervised   Electrical Stimulation Parameters 10/20 duty cycle, 53ma    Dealer  Stimulation Goals Strength;Neuromuscular facilitation      Manual Therapy   Manual Therapy Soft tissue mobilization    Soft tissue mobilization Rt knee effluerage, medial and lateral joint lines, retrograde massage, Rt gastroc and soleus                    PT Short Term Goals - 08/03/20 0981      PT SHORT TERM GOAL #1   Title Pt will demo consistency and independent in initial HEP.    Time 6    Period Weeks    Status New    Target Date 09/14/20      PT SHORT TERM GOAL #2   Title Pt will report a 30% reduction in the frequency and intensity of Rt knee pain with standing and walking    Baseline --    Time 6    Period Weeks    Status New    Target Date 09/14/20      PT SHORT TERM GOAL #3   Title demonstrate symmetry with ambulation on level surfaces for household distances     Baseline --    Time 6    Period Weeks    Status New    Target Date 09/14/20      PT SHORT TERM GOAL #4   Title reduce Rt LE pain and improve functional strength to ascend steps with step-over-step gait    Baseline --    Time 6    Period Weeks    Status New    Target Date 09/14/20             PT Long Term Goals - 08/03/20 0953      PT LONG TERM GOAL #1   Title be independent in advanced HEP    Time 12    Period Weeks    Status New    Target Date 10/26/20      PT LONG TERM GOAL #2   Title reduce FOTO to < or = to 32% limitation    Baseline --    Time 12    Period Weeks    Status New    Target Date 10/26/20      PT LONG TERM GOAL #3   Title reduce Rt knee pain and improve functional strength to ascend and descend steps with step-over-step gait    Baseline --    Time 12    Period Weeks    Target Date 10/26/20      PT LONG TERM GOAL #4   Title demonstrate 0 degrees of Rt knee A/ROM extension to improve symmetry with gait    Time 12    Period Weeks    Status New    Target Date 10/26/20      PT LONG TERM GOAL #5   Title report < or = to 3/10 Rt knee pain with standing and walking    Time 8    Period Weeks    Status New    Target Date 10/26/20                 Plan - 08/15/20 1012    Clinical Impression Statement Pt had a Rt knee injection this AM immediately prior to PT appointment.  She has ongoing pain ranging from 4-6/10.  MD and Pt discussed MRI if injection doesn't help.  Session focused on STM, ROM, stretching of calf and quad activation with and without supervised [place] stim.  PT advised continued HEP and use  of neoprene sleeve as tolerated within lower pain range.    Comorbidities LBP    Rehab Potential Excellent    PT Frequency 2x / week    PT Duration 12 weeks    PT Treatment/Interventions ADLs/Self Care Home Management;Cryotherapy;Electrical Stimulation;Moist Heat;Functional mobility Event organiser;Therapeutic  activities;Therapeutic exercise;Balance training;Neuromuscular re-education;Patient/family education;Manual techniques;Passive range of motion;Taping;Vasopneumatic Device    PT Next Visit Plan f/u on injection, continue quad and hip strength, progress if pain is reduced (sit to stand, forward and lateral step ups, counter squat)  Game Ready if edema present    PT Home Exercise Plan Access Code: [email or web address]    Consulted and Agree with Plan of Care Patient           Patient will benefit from skilled therapeutic intervention in order to improve the following deficits and impairments:     Visit Diagnosis: Acute pain of right knee  Other abnormalities of gait and mobility  Muscle weakness (generalized)  Stiffness of right knee, not elsewhere classified     Problem List Patient Active Problem List   Diagnosis Date Noted  . Low back pain 10/25/2010   Baruch Merl, PT 08/15/20 10:15 AM   [facility] [address], [place], [place], [address] Phone: [phone]   Fax:  [phone]  Name: Barbara Oconnor MRN: [id number] Date of Birth: March 27, 1966

## 2020-08-24 ENCOUNTER — Encounter: Payer: Self-pay | Admitting: Physical Therapy

## 2020-08-24 ENCOUNTER — Other Ambulatory Visit: Payer: Self-pay

## 2020-08-24 ENCOUNTER — Ambulatory Visit: Payer: BC Managed Care – PPO | Attending: Physician Assistant | Admitting: Physical Therapy

## 2020-08-24 DIAGNOSIS — R2689 Other abnormalities of gait and mobility: Secondary | ICD-10-CM

## 2020-08-24 DIAGNOSIS — M6281 Muscle weakness (generalized): Secondary | ICD-10-CM | POA: Diagnosis not present

## 2020-08-24 DIAGNOSIS — M25561 Pain in right knee: Secondary | ICD-10-CM | POA: Diagnosis not present

## 2020-08-24 DIAGNOSIS — M25661 Stiffness of right knee, not elsewhere classified: Secondary | ICD-10-CM

## 2020-08-24 NOTE — Patient Instructions (Signed)
Access Code: [email or web address] URL: [email or web address] Date: 08/24/2020 Prepared by: Venetia Night Corney Knighton  Exercises Clamshell - 2 x daily - 7 x weekly - 1 sets - 15 reps Active Straight Leg Raise with Quad Set - 2 x daily - 7 x weekly - 2 sets - 20 reps Seated Hip Abduction with Resistance - 1 x daily - 7 x weekly - 1 sets - 20 reps Sit to Stand with Resistance Around Legs - 1 x daily - 7 x weekly - 2 sets - 5 reps Seated Hamstring Curls with Resistance - 1 x daily - 7 x weekly - 2 sets - 10 reps Standing Hip Extension with Counter Support - 1 x daily - 7 x weekly - 2 sets - 10 reps Standing Hip Abduction with Counter Support - 1 x daily - 7 x weekly - 2 sets - 10 reps

## 2020-08-24 NOTE — Therapy (Signed)
[facility] [address] [place], [place], [address] Phone: [phone]   Fax:  [phone]  Physical Therapy Treatment  Patient Details  Name: Barbara Oconnor MRN: [id number] Date of Birth: 1966/03/27 Referring Provider (PT): Erskine Emery, [facility]   Encounter Date: 08/24/2020   PT End of Session - 08/24/20 1146    Visit Number 5    Date for PT Re-Evaluation 10/26/20    Authorization Type BCBS    PT Start Time 1100    PT Stop Time 1145    PT Time Calculation (min) 45 min    Activity Tolerance Patient tolerated treatment well    Behavior During Therapy [facility] for tasks assessed/performed           Past Medical History:  Diagnosis Date  . Complication of anesthesia    slow awakening  . Goiter 2002   u/s 2002, 2010 (Dr. Buddy Duty)  . H/O motion sickness   . Low back pain 10/2010  . Multiple thyroid nodules   . Rectal bleeding    anal fissure    Past Surgical History:  Procedure Laterality Date  . COLONOSCOPY  03/2010   Jule Ser); benign polyp, told to repeat 10 years  . EXAMINATION UNDER ANESTHESIA N/A 11/04/2014   Procedure: EXAM UNDER ANESTHESIA;  Surgeon: Leighton Ruff, MD;  Location: [facility];  Service: General;  Laterality: N/A;  . hemorrhoid banding    . SPHINCTEROTOMY N/A 11/04/2014   Procedure: CHEMICAL SPHINCTEROTOMY WITH BOTOX/ fisurectomy;  Surgeon: Leighton Ruff, MD;  Location: [place];  Service: General;  Laterality: N/A;  . TEAR DUCT PROBING  2006   left  . TONSILLECTOMY  1986    There were no vitals filed for this visit.   Subjective Assessment - 08/24/20 1103    Subjective Much improved pain since injection.  Still hurts to cross my legs.  Steps are better but knee is crunchy/noisy.    How long can you walk comfortably? slow gait with antalgia- walking is painful    Diagnostic tests x-ray: patellofemoral  crepitus    Patient Stated Goals reduce Rt knee  pain, walk up/down steps normally, exercise without limitation    Currently in Pain? Yes    Pain Score 2     Pain Location Knee    Pain Orientation Right;Anterior    Pain Descriptors / Indicators Tightness    Pain Type Chronic pain    Pain Onset More than a month ago    Pain Frequency Intermittent    Aggravating Factors  crossing legs, stairs improved but noisy knee                             [facility] Adult PT Treatment/Exercise - 08/24/20 0001      Exercises   Exercises Knee/Hip      Knee/Hip Exercises: Aerobic   Recumbent Bike L2 x 4' PT present to discuss symptoms and monitor      Knee/Hip Exercises: Standing   Hip Abduction Stengthening;Right;10 reps;Knee straight;Left    Hip Extension Stengthening;Right;10 reps;Knee straight;Left    Functional Squat 15 reps    Functional Squat Limitations small range, holding edge of TM    Rebounder weight shift marching side to side and stagger 1' each way      Knee/Hip Exercises: Seated   Abd/Adduction Limitations red loop band abd pulse Rt/Lt x 1'    Sit to Sand 5 reps;without UE  support   red loop band     Knee/Hip Exercises: Supine   Straight Leg Raises Strengthening;Right;2 sets;15 reps    Straight Leg Raises Limitations 2lb      Knee/Hip Exercises: Sidelying   Clams red loop bil x 15      Knee/Hip Exercises: Prone   Hamstring Curl 1 set;20 reps    Hamstring Curl Limitations 2lb    Hip Extension Strengthening;Right;2 sets;10 reps    Hip Extension Limitations knee bent, 2lb ankle weight                  PT Education - 08/24/20 1142    Education Details Access Code: [email or web address]    Person(s) Educated Patient    Methods Explanation;Demonstration;Handout    Comprehension Verbalized understanding;Returned demonstration            PT Short Term Goals - 08/24/20 1247      PT SHORT TERM GOAL #1   Title Pt will demo consistency and independent in initial HEP.    Baseline updated HEP 08/24/20 as Pt is  tolerating more since injection and demo'ing improved VMO activation    Status On-going      PT SHORT TERM GOAL #2   Title Pt will report a 30% reduction in the frequency and intensity of Rt knee pain with standing and walking    Status Achieved      PT SHORT TERM GOAL #3   Title demonstrate symmetry with ambulation on level surfaces for household distances    Status Achieved      PT SHORT TERM GOAL #4   Title reduce Rt LE pain and improve functional strength to ascend steps with step-over-step gait    Status Achieved             PT Long Term Goals - 08/03/20 0953      PT LONG TERM GOAL #1   Title be independent in advanced HEP    Time 12    Period Weeks    Status New    Target Date 10/26/20      PT LONG TERM GOAL #2   Title reduce FOTO to < or = to 32% limitation    Baseline --    Time 12    Period Weeks    Status New    Target Date 10/26/20      PT LONG TERM GOAL #3   Title reduce Rt knee pain and improve functional strength to ascend and descend steps with step-over-step gait    Baseline --    Time 12    Period Weeks    Target Date 10/26/20      PT LONG TERM GOAL #4   Title demonstrate 0 degrees of Rt knee A/ROM extension to improve symmetry with gait    Time 12    Period Weeks    Status New    Target Date 10/26/20      PT LONG TERM GOAL #5   Title report < or = to 3/10 Rt knee pain with standing and walking    Time 8    Period Weeks    Status New    Target Date 10/26/20                 Plan - 08/24/20 1146    Clinical Impression Statement Pt with much improved Rt knee pain ranging from 0-2 since injection.  She met several STGs today.  She was ambulating with symmetry today.  She was  able to tolerate increased ther ex reps and resistance today.  She has some Rt knee valgus descending stairs so PT updated HEP for hip ER/abd strength.  Pt had interminttent bil knee pain (Lt lateral and Rt medial) during ther ex but nothing that lasted.  Rt VMO  with much improved activation during all ther ex noted.  Pt understood updates to HEP and demo'd good performance of all ther ex.  She reported 1/10 pain in Rt knee end of session.  No swelling observed and good patellar mobility on Rt.  Continue along POC with ongoing assessment of response to interventions.    Comorbidities LBP    Rehab Potential Excellent    PT Frequency 2x / week    PT Duration 12 weeks    PT Treatment/Interventions ADLs/Self Care Home Management;Cryotherapy;Electrical Stimulation;Moist Heat;Functional mobility Event organiser;Therapeutic activities;Therapeutic exercise;Balance training;Neuromuscular re-education;Patient/family education;Manual techniques;Passive range of motion;Taping;Vasopneumatic Device    PT Next Visit Plan review updated HEP, continue bike and ther ex for hip and knee as tol, continue working on descending stairs for valgus control    PT Home Exercise Plan Access Code: [email or web address]    Consulted and Agree with Plan of Care Patient           Patient will benefit from skilled therapeutic intervention in order to improve the following deficits and impairments:     Visit Diagnosis: Acute pain of right knee  Other abnormalities of gait and mobility  Muscle weakness (generalized)  Stiffness of right knee, not elsewhere classified     Problem List Patient Active Problem List   Diagnosis Date Noted  . Irregular intermenstrual bleeding 08/15/2020  . Fecal occult blood test positive 08/15/2020  . Low back pain 10/25/2010    Baruch Merl, PT 08/24/20 12:49 PM   [facility] [address], [place], [place], [address] Phone: [phone]   Fax:  [phone]  Name: Barbara Oconnor MRN: [id number] Date of Birth: October 02, 1965

## 2020-08-25 ENCOUNTER — Other Ambulatory Visit: Payer: Self-pay

## 2020-08-25 DIAGNOSIS — M25561 Pain in right knee: Secondary | ICD-10-CM

## 2020-08-30 ENCOUNTER — Ambulatory Visit: Payer: BC Managed Care – PPO | Admitting: Physical Therapy

## 2020-08-30 ENCOUNTER — Other Ambulatory Visit: Payer: Self-pay

## 2020-08-30 ENCOUNTER — Encounter: Payer: Self-pay | Admitting: Physical Therapy

## 2020-08-30 DIAGNOSIS — M6281 Muscle weakness (generalized): Secondary | ICD-10-CM | POA: Diagnosis not present

## 2020-08-30 DIAGNOSIS — M25561 Pain in right knee: Secondary | ICD-10-CM | POA: Diagnosis not present

## 2020-08-30 DIAGNOSIS — M25661 Stiffness of right knee, not elsewhere classified: Secondary | ICD-10-CM

## 2020-08-30 DIAGNOSIS — R2689 Other abnormalities of gait and mobility: Secondary | ICD-10-CM

## 2020-08-30 NOTE — Therapy (Signed)
[facility] [address] [place], [place], [address] Phone: [phone]   Fax:  [phone]  Physical Therapy Treatment  Patient Details  Name: Barbara Oconnor MRN: [id number] Date of Birth: [birth year] Referring Provider (PT): Erskine Emery, [facility]   Encounter Date: 08/30/2020   PT End of Session - 08/30/20 1537    Visit Number 6    Date for PT Re-Evaluation 10/26/20    Authorization Type BCBS    PT Start Time 1534    PT Stop Time 1612    PT Time Calculation (min) 38 min    Activity Tolerance Patient tolerated treatment well    Behavior During Therapy [facility] for tasks assessed/performed           Past Medical History:  Diagnosis Date  . Complication of anesthesia    slow awakening  . Goiter 2002   u/s 2002, 2010 (Dr. Buddy Duty)  . H/O motion sickness   . Low back pain 10/2010  . Multiple thyroid nodules   . Rectal bleeding    anal fissure    Past Surgical History:  Procedure Laterality Date  . COLONOSCOPY  03/2010   Jule Ser); benign polyp, told to repeat 10 years  . EXAMINATION UNDER ANESTHESIA N/A 11/04/2014   Procedure: EXAM UNDER ANESTHESIA;  Surgeon: Leighton Ruff, MD;  Location: [facility];  Service: General;  Laterality: N/A;  . hemorrhoid banding    . SPHINCTEROTOMY N/A 11/04/2014   Procedure: CHEMICAL SPHINCTEROTOMY WITH BOTOX/ fisurectomy;  Surgeon: Leighton Ruff, MD;  Location: [place];  Service: General;  Laterality: N/A;  . TEAR DUCT PROBING  2006   left  . TONSILLECTOMY  1986    There were no vitals filed for this visit.   Subjective Assessment - 08/30/20 1535    Subjective Knee is about the same - improved since injection.  I have walked, mowed the yard, and biked 6 miles on recumbant bike.  Sometimes my Lt knee hurts more than my Rt.  Rt knee is still very noisy going up/down stairs.  I have an MRI ordered but waiting to be scheduled.    Diagnostic  tests x-ray: patellofemoral  crepitus    Patient Stated Goals reduce Rt knee pain, walk up/down steps normally, exercise without limitation    Currently in Pain? Yes    Pain Score 2     Pain Location Knee    Pain Orientation Right;Anterior    Pain Descriptors / Indicators Sharp    Pain Type Chronic pain    Pain Onset More than a month ago    Pain Frequency Intermittent    Aggravating Factors  side to side movement                             [facility] Adult PT Treatment/Exercise - 08/30/20 0001      Exercises   Exercises Knee/Hip      Knee/Hip Exercises: Aerobic   Recumbent Bike L2 then L3 x 3' each, PT present to discuss progress      Knee/Hip Exercises: Machines for Strengthening   Cybex Leg Press 50lb Rt LE only 2x10      Knee/Hip Exercises: Standing   Rebounder weight shift marching side to side and stagger 1' each way    Other Standing Knee Exercises sidestepping and monster walks black loop band 6 steps each way x 3 rounds each      Knee/Hip  Exercises: Seated   Sit to Sand 10 reps;without UE support      Knee/Hip Exercises: Supine   Bridges Strengthening;Both;15 reps    Bridges Limitations feet on red ball, slight knee bend    Straight Leg Raises Strengthening;Both;15 reps;2 sets    Straight Leg Raises Limitations 2lb      Knee/Hip Exercises: Sidelying   Hip ABduction Strengthening;Both;2 sets;10 reps    Hip ADduction Strengthening;Both;2 sets;10 reps                    PT Short Term Goals - 08/24/20 1247      PT SHORT TERM GOAL #1   Title Pt will demo consistency and independent in initial HEP.    Baseline updated HEP 08/24/20 as Pt is tolerating more since injection and demo'ing improved VMO activation    Status On-going      PT SHORT TERM GOAL #2   Title Pt will report a 30% reduction in the frequency and intensity of Rt knee pain with standing and walking    Status Achieved      PT SHORT TERM GOAL #3   Title demonstrate symmetry  with ambulation on level surfaces for household distances    Status Achieved      PT SHORT TERM GOAL #4   Title reduce Rt LE pain and improve functional strength to ascend steps with step-over-step gait    Status Achieved             PT Long Term Goals - 08/30/20 1606      PT LONG TERM GOAL #1   Title be independent in advanced HEP    Status On-going      PT LONG TERM GOAL #2   Title reduce FOTO to < or = to 32% limitation    Status On-going      PT LONG TERM GOAL #3   Title reduce Rt knee pain and improve functional strength to ascend and descend steps with step-over-step gait    Status Achieved      PT LONG TERM GOAL #4   Title demonstrate 0 degrees of Rt knee A/ROM extension to improve symmetry with gait    Status Achieved      PT LONG TERM GOAL #5   Title report < or = to 3/10 Rt knee pain with standing and walking    Status Achieved                 Plan - 08/30/20 1547    Clinical Impression Statement Pt has met several LTGs.  She has continued improvement in Rt knee pain since injection, with pain ranging from 0-2/10.  She has been able to return to walking, mowing yard and riding stationary bike.  She has intermittent pain with lateral movement which is briefly sharp and doesn't last.  She was able to progress hip strength today against gravity with 2lb ankle weights x 3 ways.  Back pain prevented prone hip ext so bridges used for gluts.  Pt can demo symmetrical sit to stand and controlled stand to sit.  Rt quad is firing well around knee.  PT added some lateral stabilization given report of sharp intermittent pain with functional lateral movement.  Pt able to tolerate all ther ex without exacerbation of knee pain.  She gets intermittent Lt knee pain.  Continue along POC.    Comorbidities LBP    PT Frequency 2x / week    PT Duration 12 weeks  PT Treatment/Interventions ADLs/Self Care Home Management;Cryotherapy;Electrical Stimulation;Moist Heat;Functional  mobility Event organiser;Therapeutic activities;Therapeutic exercise;Balance training;Neuromuscular re-education;Patient/family education;Manual techniques;Passive range of motion;Taping;Vasopneumatic Device    PT Next Visit Plan continue hip and knee strength and functional strength    PT Home Exercise Plan Access Code: [email or web address]    Consulted and Agree with Plan of Care Patient           Patient will benefit from skilled therapeutic intervention in order to improve the following deficits and impairments:     Visit Diagnosis: Acute pain of right knee  Other abnormalities of gait and mobility  Muscle weakness (generalized)  Stiffness of right knee, not elsewhere classified     Problem List Patient Active Problem List   Diagnosis Date Noted  . Irregular intermenstrual bleeding 08/15/2020  . Fecal occult blood test positive 08/15/2020  . Low back pain 10/25/2010    Baruch Merl, PT 08/30/20 4:14 PM   [facility] [address] [place], [place], [address] Phone: [phone]   Fax:  [phone]  Name: SHACORIA LATIF MRN: [id number] Date of Birth: Mar 18, 1966

## 2020-08-31 ENCOUNTER — Encounter: Payer: BC Managed Care – PPO | Admitting: Physical Therapy

## 2020-09-02 ENCOUNTER — Telehealth: Payer: Self-pay | Admitting: Orthopaedic Surgery

## 2020-09-02 NOTE — Telephone Encounter (Signed)
Called and spoke with patient and she stated to call back to set appt with Dr. Ninfa Linden for MRI review after 12/11.

## 2020-09-03 ENCOUNTER — Other Ambulatory Visit: Payer: Self-pay

## 2020-09-03 ENCOUNTER — Ambulatory Visit
Admission: RE | Admit: 2020-09-03 | Discharge: 2020-09-03 | Disposition: A | Discharge status: Home / Self Care | Payer: BC Managed Care – PPO | Source: Ambulatory Visit | Attending: Orthopaedic Surgery | Admitting: Orthopaedic Surgery

## 2020-09-03 DIAGNOSIS — M25561 Pain in right knee: Secondary | ICD-10-CM

## 2020-09-03 IMAGING — MR MR KNEE*R* W/O CM
4 of 7 series · 19 of 40 positions shown · non-contrast
Comparison: X-ray [DATE]

CLINICAL DATA: Chronic anterior and medial right knee pain

EXAM:
MRI OF THE RIGHT KNEE WITHOUT CONTRAST
TECHNIQUE: Multiplanar, multisequence MR imaging of the knee was performed. No
intravenous contrast was administered.

[Series 3: T2 fat-sat · axial · 4.0mm · 0.35mm/px · z∈[-72,+77]mm · 4 of 35 slices shown (1 of 2)]
[im 1/35]
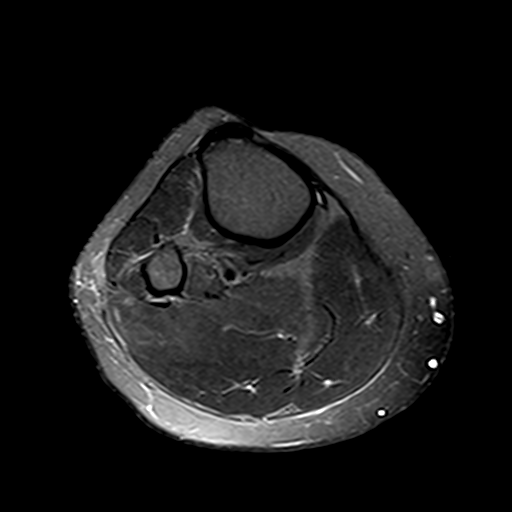
[im 7/35]
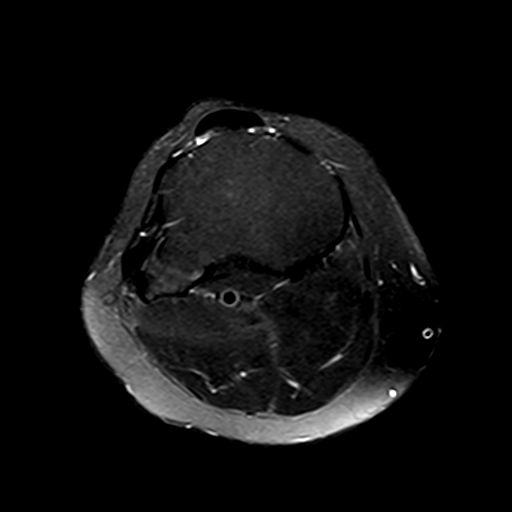
[im 21/35]
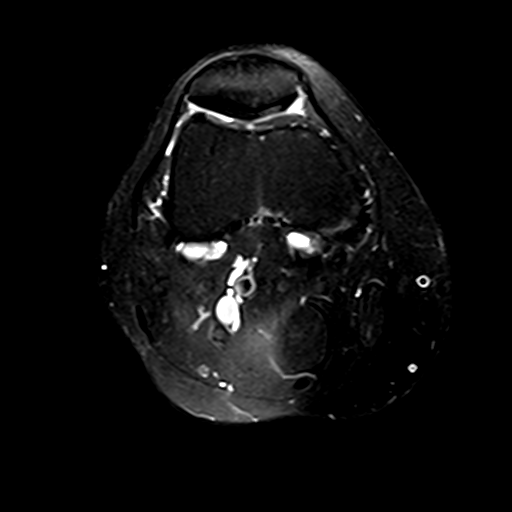
[im 35/35]
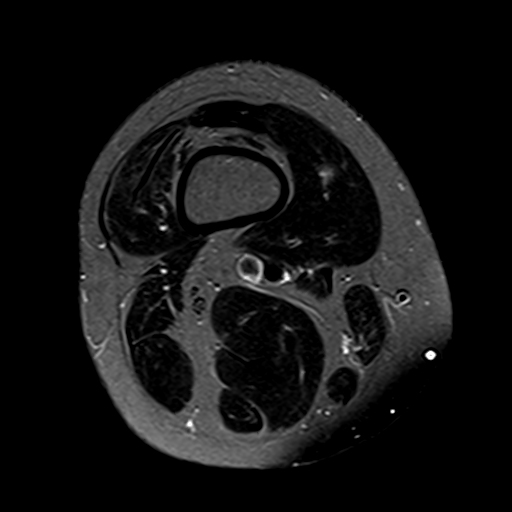

[Series 4: T2 fat-sat · axial · 4.0mm · 0.35mm/px · z∈[-46,+77]mm · 3 of 35 slices shown (2 of 2)]
[im 7/35]
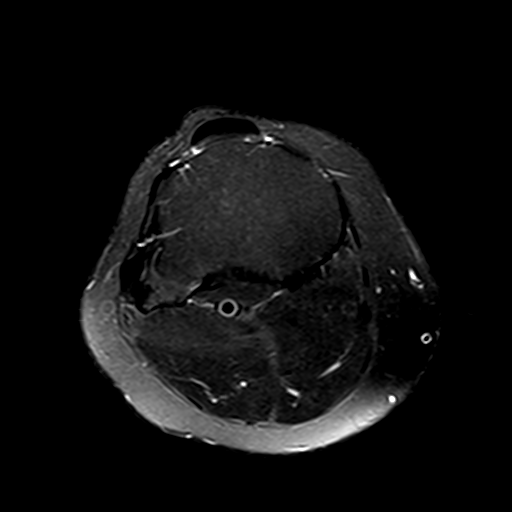
[im 21/35]
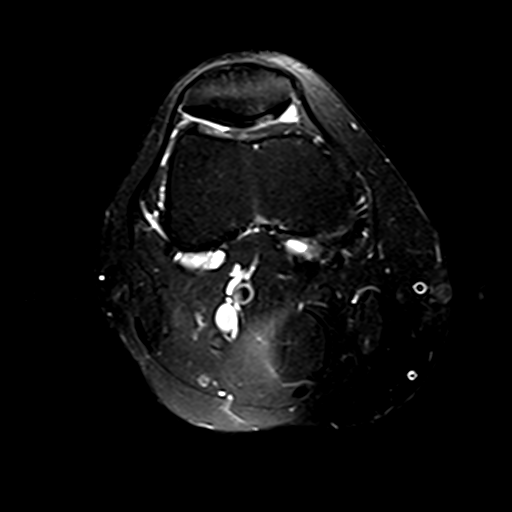
[im 35/35]
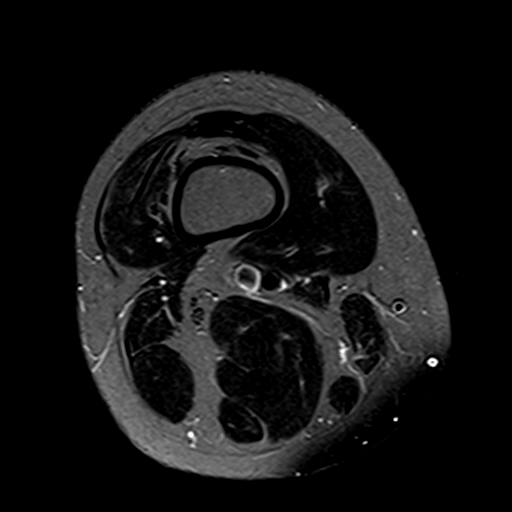

[Series 7: PD fat-sat · coronal · 3.0mm · 0.33mm/px · 6 of 37 slices shown (1 of 2)]
[im 1/37]
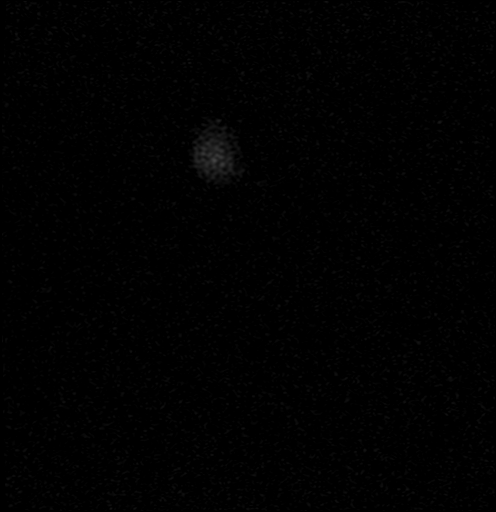
[im 8/37]
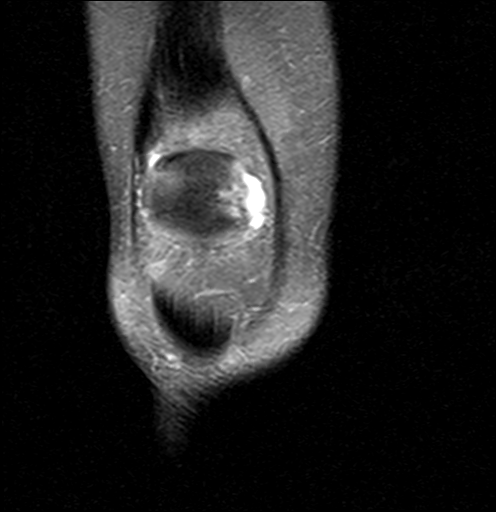
[im 15/37]
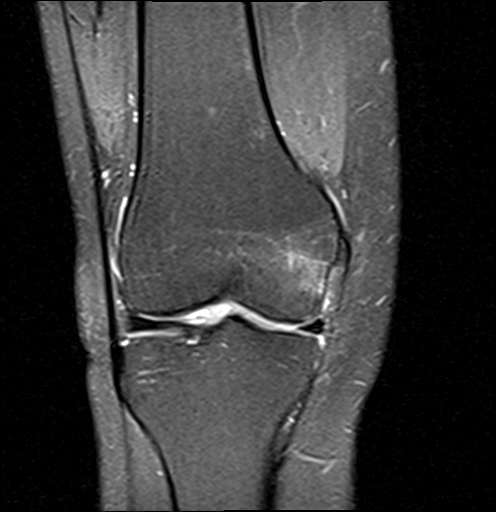
[im 22/37]
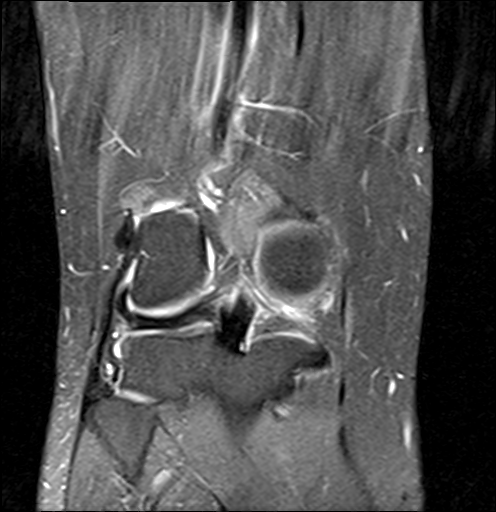
[im 29/37]
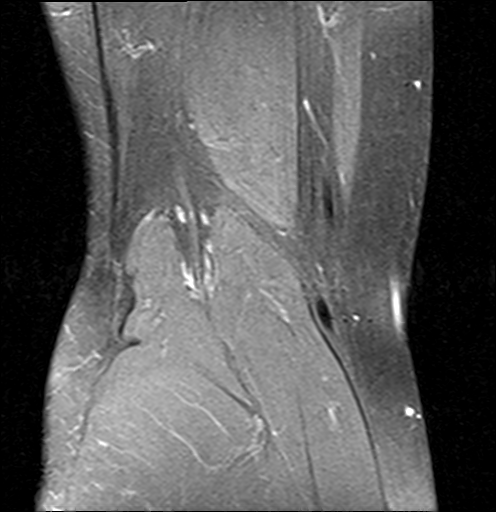
[im 37/37]
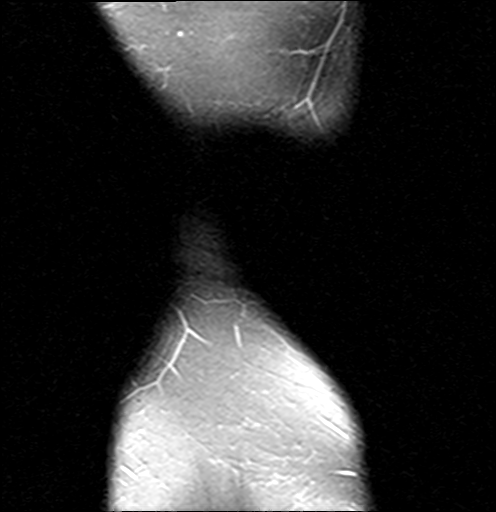

[Series 8: PD fat-sat · sagittal · 3.0mm · 0.35mm/px · 6 of 37 slices shown (2 of 2)]
[im 1/37]
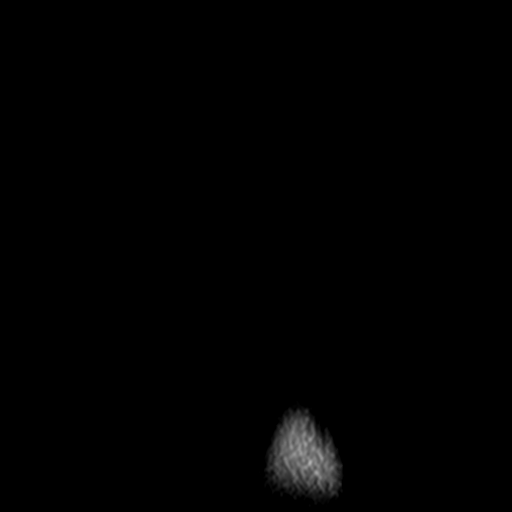
[im 8/37]
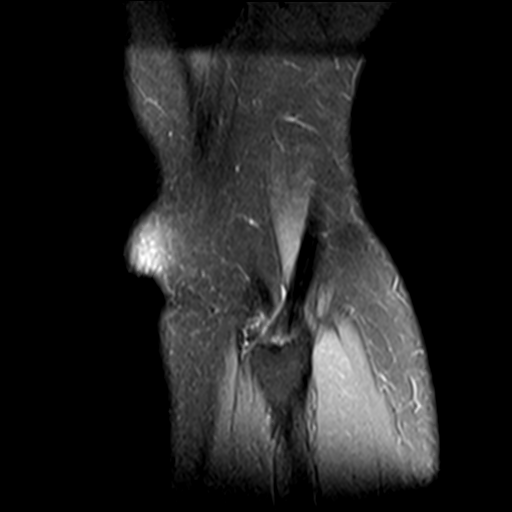
[im 15/37]
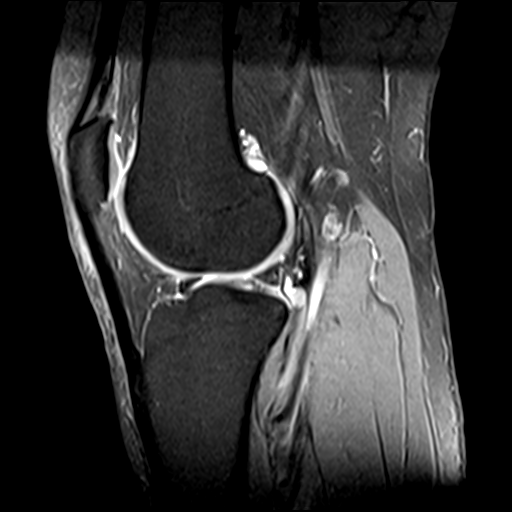
[im 22/37]
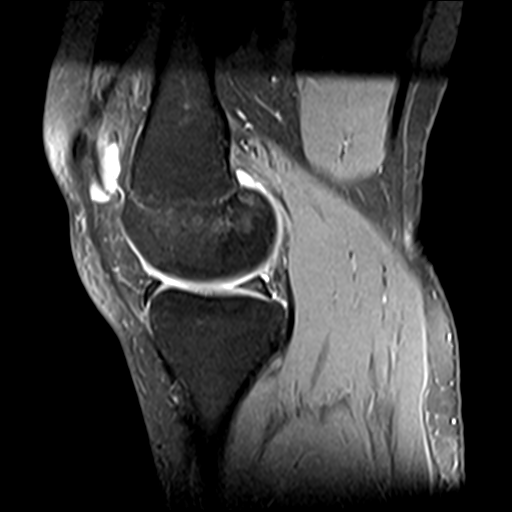
[im 29/37]
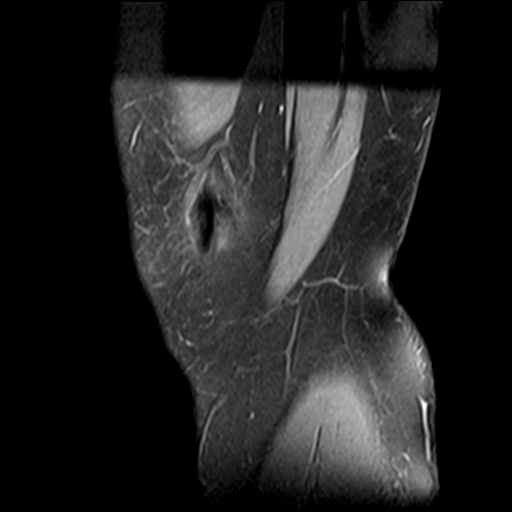
[im 37/37]
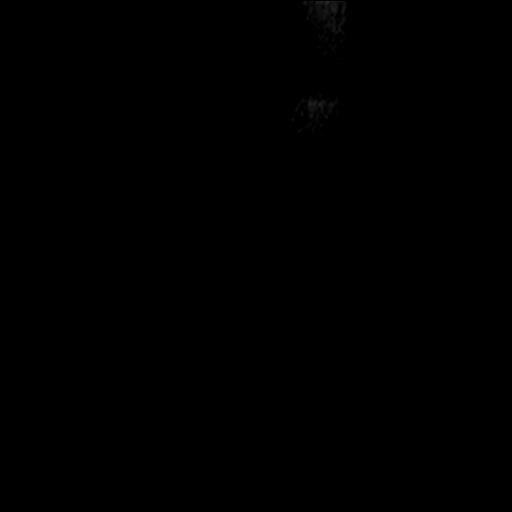

[19 of 40 positions shown; findings below may reference images not displayed]

FINDINGS: MENISCI

Medial meniscus: Intrasubstance degeneration of the posterior horn
and body segments of the medial meniscus without discrete tear.

Lateral meniscus:  Intact.

LIGAMENTS

Cruciates: Intact ACL. PCL is intact although the proximal to mid
portions of the ligament are slightly thickened and intermediate in
signal intensity (series 8, image 20) suggesting sprain or partial
tear.

Collaterals: Medial collateral ligament is intact. Lateral
collateral ligament complex is intact.

CARTILAGE

Patellofemoral: Mild partial-thickness chondral irregularity of the
medial patellar facet and patellar apex. No focal trochlear chondral
defect.

Medial:  No chondral defect.

Lateral:  No chondral defect.

Joint:  No joint effusion.  Fat pads within normal limits.

Popliteal Fossa:  No Baker cyst. Intact popliteus tendon.

Extensor Mechanism: Intact quadriceps tendon and patellar tendon.
Mild distal quadriceps tendinosis.

Bones: Focal bone marrow edema within the peripheral aspect of the
medial femoral condyle (series 6, image 16) without associated
fracture line. Osseous structures are otherwise within normal
limits. No malalignment. No suspicious bone lesion.

Other: None.
IMPRESSION: 1. Sprain or partial tear of the PCL.
2. Focal bone marrow edema within the peripheral aspect of the
medial femoral condyle without associated fracture line. Findings
suggestive of a bone contusion.
3. Medial meniscal degeneration without discrete tear.
4. Mild distal quadriceps tendinosis.
5. Mild partial-thickness chondral irregularity of the medial
patellar facet and patellar apex.

## 2020-09-06 ENCOUNTER — Encounter: Payer: Self-pay | Admitting: Physical Therapy

## 2020-09-06 ENCOUNTER — Ambulatory Visit: Payer: BC Managed Care – PPO | Admitting: Physical Therapy

## 2020-09-06 ENCOUNTER — Other Ambulatory Visit: Payer: Self-pay

## 2020-09-06 DIAGNOSIS — M25661 Stiffness of right knee, not elsewhere classified: Secondary | ICD-10-CM

## 2020-09-06 DIAGNOSIS — M6281 Muscle weakness (generalized): Secondary | ICD-10-CM

## 2020-09-06 DIAGNOSIS — M25561 Pain in right knee: Secondary | ICD-10-CM

## 2020-09-06 DIAGNOSIS — R2689 Other abnormalities of gait and mobility: Secondary | ICD-10-CM

## 2020-09-06 NOTE — Therapy (Addendum)
[facility] [address] [place], [place], [address] Phone: [phone]   Fax:  [phone]  Physical Therapy Treatment  Patient Details  Name: Barbara Oconnor MRN: [id number] Date of Birth: [birth year] Referring Provider (PT): Erskine Emery, [facility]   Encounter Date: 09/06/2020   PT End of Session - 09/06/20 1357    Visit Number 7    Date for PT Re-Evaluation 10/26/20    Authorization Type BC[employer]    PT Start Time 0258    PT Stop Time 1439    PT Time Calculation (min) 41 min    Activity Tolerance Patient tolerated treatment well    Behavior During Therapy [facility] for tasks assessed/performed           Past Medical History:  Diagnosis Date  . Complication of anesthesia    slow awakening  . Goiter 2002   u/s 2002, 2010 (Dr. Buddy Duty)  . H/O motion sickness   . Low back pain 10/2010  . Multiple thyroid nodules   . Rectal bleeding    anal fissure    Past Surgical History:  Procedure Laterality Date  . COLONOSCOPY  03/2010   Jule Ser); benign polyp, told to repeat 10 years  . EXAMINATION UNDER ANESTHESIA N/A 11/04/2014   Procedure: EXAM UNDER ANESTHESIA;  Surgeon: Leighton Ruff, MD;  Location: [facility];  Service: General;  Laterality: N/A;  . hemorrhoid banding    . SPHINCTEROTOMY N/A 11/04/2014   Procedure: CHEMICAL SPHINCTEROTOMY WITH BOTOX/ fisurectomy;  Surgeon: Leighton Ruff, MD;  Location: [place];  Service: General;  Laterality: N/A;  . TEAR DUCT PROBING  2006   left  . TONSILLECTOMY  1986    There were no vitals filed for this visit.   Subjective Assessment - 09/06/20 1358    Subjective I just did 6 miles on the bike at the gym and my knee didn't hurt.    How long can you walk comfortably? slow gait with antalgia- walking is painful    Diagnostic tests x-ray: patellofemoral  crepitus    Patient Stated Goals reduce Rt knee pain, walk up/down steps normally, exercise  without limitation    Currently in Pain? Yes    Pain Score 1     Pain Orientation Right    Pain Descriptors / Indicators Sore    Pain Type Chronic pain    Pain Onset More than a month ago    Pain Frequency Intermittent    Aggravating Factors  side to side movement                             [facility] Adult PT Treatment/Exercise - 09/06/20 0001      Exercises   Exercises Knee/Hip;Lumbar      Knee/Hip Exercises: Stretches   Active Hamstring Stretch Both;1 rep;30 seconds    Active Hamstring Stretch Limitations foot on 3rd step    Quad Stretch Right;Left;30 seconds    Quad Stretch Limitations standing    Piriformis Stretch Both;1 rep;30 seconds    Piriformis Stretch Limitations edge of mat table    Gastroc Stretch Left;Right;2 reps;30 seconds    Gastroc Stretch Limitations slant board      Knee/Hip Exercises: Machines for Strengthening   Cybex Knee Flexion 20lb Rt only and Lt only x 15 reps      Knee/Hip Exercises: Standing   Abduction Limitations sidestepping and monster walks black loop band x 3 laps  each, 5 steps each lap back/forth    Hip Extension Stengthening;Right;Left;1 set;15 reps;Knee straight    Extension Limitations elbows on counter    Rebounder high knee marching and and stagger stance weight shift march 1' each way    Other Standing Knee Exercises deadlifts 5lb to step stool height x 10 reps, emphasis on gluts to stand                    PT Short Term Goals - 08/24/20 1247      PT SHORT TERM GOAL #1   Title Pt will demo consistency and independent in initial HEP.    Baseline updated HEP 08/24/20 as Pt is tolerating more since injection and demo'ing improved VMO activation    Status On-going      PT SHORT TERM GOAL #2   Title Pt will report a 30% reduction in the frequency and intensity of Rt knee pain with standing and walking    Status Achieved      PT SHORT TERM GOAL #3   Title demonstrate symmetry with ambulation on level  surfaces for household distances    Status Achieved      PT SHORT TERM GOAL #4   Title reduce Rt LE pain and improve functional strength to ascend steps with step-over-step gait    Status Achieved             PT Long Term Goals - 08/30/20 1606      PT LONG TERM GOAL #1   Title be independent in advanced HEP    Status On-going      PT LONG TERM GOAL #2   Title reduce FOTO to < or = to 32% limitation    Status On-going      PT LONG TERM GOAL #3   Title reduce Rt knee pain and improve functional strength to ascend and descend steps with step-over-step gait    Status Achieved      PT LONG TERM GOAL #4   Title demonstrate 0 degrees of Rt knee A/ROM extension to improve symmetry with gait    Status Achieved      PT LONG TERM GOAL #5   Title report < or = to 3/10 Rt knee pain with standing and walking    Status Achieved                 Plan - 09/06/20 1439    Clinical Impression Statement Pt came from gym where she had done bike x 6 miles without pain.  She has restricted flexibility of Rt quad, gastroc and hamstring compared to Lt side so flexibility focus added to functional strength today.  She is developing improved strength of hip which allows for improved knee control.  Pain rating is 0-1 but can have intermittent sharp pain along Rt medial knee with lateral movements or step ups/downs.  PT encouraged Pt to use seated hamstring curl machine at gym.  PT and Pt discussed tapering vists since she is doing well and is compliant with HEP.  She had an MRI but her appt with MD is early Jan to discuss.    PT Frequency 2x / week    PT Duration 12 weeks    PT Treatment/Interventions ADLs/Self Care Home Management;Cryotherapy;Electrical Stimulation;Moist Heat;Functional mobility Event organiser;Therapeutic activities;Therapeutic exercise;Balance training;Neuromuscular re-education;Patient/family education;Manual techniques;Passive range of  motion;Taping;Vasopneumatic Device    PT Next Visit Plan continue hip and knee strength and functional strength, flexibility of Rt quad and gastroc  PT Home Exercise Plan Access Code: [email or web address]    Consulted and Agree with Plan of Care Patient           Patient will benefit from skilled therapeutic intervention in order to improve the following deficits and impairments:     Visit Diagnosis: Acute pain of right knee  Other abnormalities of gait and mobility  Muscle weakness (generalized)  Stiffness of right knee, not elsewhere classified     Problem List Patient Active Problem List   Diagnosis Date Noted  . Irregular intermenstrual bleeding 08/15/2020  . Fecal occult blood test positive 08/15/2020  . Low back pain 10/25/2010    Baruch Merl, PT 09/06/20 2:43 PM  PHYSICAL THERAPY DISCHARGE SUMMARY  Visits from Start of Care: 7  Current functional level related to goals / functional outcomes: See above. Pt called to cancel remaining appointments due to improved knee pain and function.   Remaining deficits: See above.   Education / Equipment: HEP Plan: Patient agrees to discharge.  Patient goals were met. Patient is being discharged due to meeting the stated rehab goals.  ?????        Baruch Merl, PT 10/13/20 1:42 PM   [facility] [address] [place], [place], [address] Phone: [phone]   Fax:  [phone]  Name: JOSCELYNE RENVILLE MRN: [id number] Date of Birth: [birth year]

## 2020-09-09 ENCOUNTER — Ambulatory Visit: Payer: BC Managed Care – PPO | Admitting: Physical Therapy

## 2020-09-17 ENCOUNTER — Other Ambulatory Visit: Payer: Self-pay | Admitting: Family Medicine

## 2020-09-17 DIAGNOSIS — G8929 Other chronic pain: Secondary | ICD-10-CM

## 2020-09-17 DIAGNOSIS — M545 Low back pain, unspecified: Secondary | ICD-10-CM

## 2020-09-19 ENCOUNTER — Encounter: Payer: Self-pay | Admitting: Family Medicine

## 2020-09-19 NOTE — Telephone Encounter (Signed)
Is this okay to refill? 

## 2020-09-27 ENCOUNTER — Encounter: Payer: Self-pay | Admitting: Orthopaedic Surgery

## 2020-09-29 ENCOUNTER — Ambulatory Visit: Payer: BC Managed Care – PPO | Admitting: Orthopaedic Surgery

## 2020-10-03 ENCOUNTER — Ambulatory Visit: Payer: BC Managed Care – PPO | Admitting: Physical Therapy

## 2020-10-03 DIAGNOSIS — Z03818 Encounter for observation for suspected exposure to other biological agents ruled out: Secondary | ICD-10-CM | POA: Diagnosis not present

## 2020-10-03 DIAGNOSIS — Z20822 Contact with and (suspected) exposure to covid-19: Secondary | ICD-10-CM | POA: Diagnosis not present

## 2020-10-12 ENCOUNTER — Encounter: Payer: Self-pay | Admitting: Orthopaedic Surgery

## 2020-10-12 ENCOUNTER — Ambulatory Visit: Payer: BC Managed Care – PPO | Admitting: Orthopaedic Surgery

## 2020-10-12 DIAGNOSIS — M25562 Pain in left knee: Secondary | ICD-10-CM

## 2020-10-12 DIAGNOSIS — M25561 Pain in right knee: Secondary | ICD-10-CM

## 2020-10-12 HISTORY — DX: Pain in right knee: M25.561

## 2020-10-12 MED ORDER — LIDOCAINE HCL 1 % IJ SOLN
3.0000 mL | INTRAMUSCULAR | Status: AC | PRN
  Administered 2020-10-12: 3 mL

## 2020-10-12 MED ORDER — METHYLPREDNISOLONE ACETATE 40 MG/ML IJ SUSP
40.0000 mg | INTRAMUSCULAR | Status: AC | PRN
  Administered 2020-10-12: 40 mg via INTRA_ARTICULAR

## 2020-10-12 NOTE — Progress Notes (Signed)
Office Visit Note   Patient: Barbara Oconnor           Date of Birth: 1965/09/27           MRN: [id number] Visit Date: 10/12/2020              Requested by: Rita Ohara, [address] [place],  [place] [address] PCP: Rita Ohara, MD   Assessment & Plan: Visit Diagnoses:  1. Acute pain of right knee   2. Acute pain of left knee     Plan: I gave the patient reassurance that her right knee does not require any type of surgical intervention.  She may be a candidate later for hyaluronic acid to treat the pain from the osteoarthritis of the patellofemoral joint.  Per her request I did provide a steroid injection of the left knee.  All questions and concerns were answered and addressed.  She will call if she decides to have a gel injection in her knees.  I also recommended glucosamine and/or turmeric.  Follow-Up Instructions: Return if symptoms worsen or fail to improve.   Orders:  Orders Placed This Encounter  Procedures  . Large Joint Inj   No orders of the defined types were placed in this encounter.     Procedures: Large Joint Inj: L knee on 10/12/2020 2:20 PM Indications: diagnostic evaluation and pain Details: 22 G 1.5 in needle, superolateral approach  Arthrogram: No  Medications: 3 mL lidocaine 1 %; 40 mg methylPREDNISolone acetate 40 MG/ML Outcome: tolerated well, no immediate complications Procedure, treatment alternatives, risks and benefits explained, specific risks discussed. Consent was given by the patient. Immediately prior to procedure a time out was called to verify the correct patient, procedure, equipment, support staff and site/side marked as required. Patient was prepped and draped in the usual sterile fashion.       Clinical Data: No additional findings.   Subjective: Chief Complaint  Patient presents with  . Right Knee - Follow-up  The patient comes in today to go over MRI of her right knee.  She says the knee has been feeling better  after steroid injection and physical therapy.  She is requesting a steroid injection in her left knee because that is been problematic as she has been getting over her right knee.  A MRI was appropriate given the medial tenderness that she is having as well as the deep pain in her right knee.  She has had no other acute change in her medical status.  HPI  Review of Systems There is currently listed no headache, chest pain, shortness of breath, fever, chills, nausea, vomiting  Objective: Vital Signs: There were no vitals taken for this visit.  Physical Exam She is alert and orient x3 and in no acute distress Ortho Exam Examination of both knees shows no effusion.  The right knee does have some patellofemoral crepitation.  There is medial joint line tenderness.  There is lateral joint line tenderness of the left knee.  Both knees are ligamentously stable. Specialty Comments:  No specialty comments available.  Imaging: No results found. The MRI of her right knee does show some patellofemoral arthritic changes mainly of the medial aspect the patella.  There is chronic tendinosis of the quadriceps tendon.  There is at least a sprain or partial tear of the PCL.  The medial lateral compartment cartilage is well-maintained.  There is no meniscal tear.  There is finding suggesting a bone contusion on the medial femoral condyle.  PMFS History: Patient Active Problem List   Diagnosis Date Noted  . Irregular intermenstrual bleeding 08/15/2020  . Fecal occult blood test positive 08/15/2020  . Low back pain 10/25/2010   Past Medical History:  Diagnosis Date  . Complication of anesthesia    slow awakening  . Goiter 2002   u/s 2002, 2010 (Dr. Buddy Duty)  . H/O motion sickness   . Low back pain 10/2010  . Multiple thyroid nodules   . Rectal bleeding    anal fissure    Family History  Problem Relation Age of Onset  . Cancer Mother 79       breast cancer  . Dementia Mother   . Hyperlipidemia  Father   . Hypertension Father   . Cancer Father 82       stage 4 non-hodgkins lymphoma  . Lymphoma Father   . Bipolar disorder Sister   . Migraines Daughter   . Cancer Maternal Aunt        thyroid cancer  . Cancer Paternal Uncle        lymphatic  . Diabetes Paternal Grandmother   . Heart disease Paternal Grandfather     Past Surgical History:  Procedure Laterality Date  . COLONOSCOPY  03/2010   Jule Ser); benign polyp, told to repeat 10 years  . EXAMINATION UNDER ANESTHESIA N/A 11/04/2014   Procedure: EXAM UNDER ANESTHESIA;  Surgeon: Leighton Ruff, MD;  Location: [facility];  Service: General;  Laterality: N/A;  . hemorrhoid banding    . SPHINCTEROTOMY N/A 11/04/2014   Procedure: CHEMICAL SPHINCTEROTOMY WITH BOTOX/ fisurectomy;  Surgeon: Leighton Ruff, MD;  Location: [facility];  Service: General;  Laterality: N/A;  . TEAR DUCT PROBING  2006   left  . TONSILLECTOMY  1986   Social History   Occupational History  . Occupation: teaches pilates  . 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 Smoking status: Never Smoker  . Smokeless tobacco: Never Used  Vaping Use  . Vaping Use: Never used  Substance and Sexual Activity  . Alcohol use: Yes    Comment: 3-4/week (wine) or less  . Drug use: No  . Sexual activity: Yes    Partners: Male    Birth control/protection: Surgical    Comment: husband had vasectomy

## 2020-10-17 ENCOUNTER — Encounter: Payer: BC Managed Care – PPO | Admitting: Physical Therapy

## 2020-11-19 ENCOUNTER — Other Ambulatory Visit: Payer: Self-pay | Admitting: Family Medicine

## 2020-11-21 NOTE — Telephone Encounter (Signed)
Called patient and she is taking the 300mg  currently, one a day. She has some of the 100mg  left and thinks she may need to go up to 600mg  a day and will transition up to this by using the 100mg  capsules. I am sending this to you to refill.

## 2020-12-20 NOTE — Patient Instructions (Addendum)
HEALTH MAINTENANCE RECOMMENDATIONS:  It is recommended that you get at least 30 minutes of aerobic exercise at least 5 days/week (for weight loss, you may need as much as 60-90 minutes). This can be any activity that gets your heart rate up. This can be divided in 10-15 minute intervals if needed, but try and build up your endurance at least once a week.  Weight bearing exercise is also recommended twice weekly.  Eat a healthy diet with lots of vegetables, fruits and fiber.  "Colorful" foods have a lot of vitamins (ie green vegetables, tomatoes, red peppers, etc).  Limit sweet tea, regular sodas and alcoholic beverages, all of which has a lot of calories and sugar.  Up to 1 alcoholic drink daily may be beneficial for women (unless trying to lose weight, watch sugars).  Drink a lot of water.  Calcium recommendations are 1200-1500 mg daily (1500 mg for postmenopausal women or women without ovaries), and vitamin D 1000 IU daily.  This should be obtained from diet and/or supplements (vitamins), and calcium should not be taken all at once, but in divided doses.  Monthly self breast exams and yearly mammograms for women over the age of 15 is recommended.  Sunscreen of at least SPF 30 should be used on all sun-exposed parts of the skin when outside between the hours of 10 am and 4 pm (not just when at beach or pool, but even with exercise, golf, tennis, and yard work!)  Use a sunscreen that says "broad spectrum" so it covers both UVA and UVB rays, and make sure to reapply every 1-2 hours.  Remember to change the batteries in your smoke detectors when changing your clock times in the spring and fall. Carbon monoxide detectors are recommended for your home.  Use your seat belt every time you are in a car, and please drive safely and not be distracted with cell phones and texting while driving.  Please get your flu shots yearly, in the Fall.  There was slight prominence of the glandular tissue at the right  medial breast.  It was small and hard to find.  Keep an eye on this area--if it persists or increases, you will need to have diagnostic mammogram and/or ultrasound to further evaluate this area.  Do not wait until October if it persists or enlarges.    MyPlate from [employer] is an outline of a general healthy diet based on the 2010 Dietary Guidelines for Americans, from the U.S. Department of Agriculture Scientist, research (physical sciences)). It sets guidelines for how much food you should eat from each food group based on your age, sex, and level of physical activity. What are tips for following MyPlate? To follow MyPlate recommendations:  Eat a wide variety of fruits and vegetables, grains, and protein foods.  Serve smaller portions and eat less food throughout the day.  Limit portion sizes to avoid overeating.  Enjoy your food.  Get at least 150 minutes of exercise every week. This is about 30 minutes each day, 5 or more days per week. It can be difficult to have every meal look like MyPlate. Think about MyPlate as eating guidelines for an entire day, rather than each individual meal. Fruits and vegetables  Make half of your plate fruits and vegetables.  Eat many different colors of fruits and vegetables each day.  For a 2,000 calorie daily food plan, eat: ? 2 cups of vegetables every day. ? 2 cups of fruit every day.  1 cup is equal to: ?  1 cup raw or cooked vegetables. ? 1 cup raw fruit. ? 1 medium-sized orange, apple, or banana. ? 1 cup 100% fruit or vegetable juice. ? 2 cups raw leafy greens, such as lettuce, spinach, or kale. ?  cup dried fruit. Grains  One fourth of your plate should be grains.  Make at least half of the grains you eat each day whole grains.  For a 2,000 calorie daily food plan, eat 6 oz of grains every day.  1 oz is equal to: ? 1 slice bread. ? 1 cup cereal. ?  cup cooked rice, cereal, or pasta. Protein  One fourth of your plate should be protein.  Eat a wide  variety of protein foods, including meat, poultry, fish, eggs, beans, nuts, and tofu.  For a 2,000 calorie daily food plan, eat 5 oz of protein every day.  1 oz is equal to: ? 1 oz meat, poultry, or fish. ?  cup cooked beans. ? 1 egg. ?  oz nuts or seeds. ? 1 Tbsp peanut butter. Dairy  Drink fat-free or low-fat (1%) milk.  Eat or drink dairy as a side to meals.  For a 2,000 calorie daily food plan, eat or drink 3 cups of dairy every day.  1 cup is equal to: ? 1 cup milk, yogurt, cottage cheese, or soy milk (soy beverage). ? 2 oz processed cheese. ? 1 oz natural cheese. Fats, oils, salt, and sugars  Only small amounts of oils are recommended.  Avoid foods that are high in calories and low in nutritional value (empty calories), like foods high in fat or added sugars.  Choose foods that are low in salt (sodium). Choose foods that have less than 140 milligrams (mg) of sodium per serving.  Drink water instead of sugary drinks. Drink enough water each day to keep your urine pale yellow. Where to find support  Work with your health care provider or a nutrition specialist (dietitian) to develop a customized eating plan that is right for you.  Download an app (mobile application) to help you track your daily food intake. Where to find more information  Go to [employer] for more information. Summary  MyPlate is a general guideline for healthy eating from the USDA. It is based on the 2010 Dietary Guidelines for Americans.  In general, fruits and vegetables should take up  of your plate, grains should take up  of your plate, and protein should take up  of your plate. This information is not intended to replace advice given to you by your health care provider. Make sure you discuss any questions you have with your health care provider. Document Revised: 02/12/2019 Document Reviewed: 12/10/2016 Elsevier Patient Education  [place].

## 2020-12-20 NOTE — Progress Notes (Signed)
Chief Complaint  Patient presents with  . Annual Exam    Fasting annual exam with pelvic. Thinks she is actually due for colonoscopy this year. Okay for Tdap. Nothing new as far as concerns.     Barbara Oconnor is a 55 y.o. female who presents for a complete physical.   She has the following concerns:  Seen in October with night sweats, insomnia. Workup for night sweats was unremarkable at that time, and pt is already on HRT from GYN. She increased gabapentin to 400mg , which helps her sleep through hot flashes. This was suggested in October, but only recently increased it (seemed to do okay over the cooler months).  Doesn't always sweat, mainly is just getting hot. This has been tolerable through the winter, is worried about the summertime.  She also had more rectal bleeding reported at that visit, fissure noted at 06/2020 visit. She declined diltiazem gel, was advised to f/u with GI if ongoing issues (having failed other treatments in the past--botox injection, diltiazem).  She increased to full cap of Miralax daily, and was using wet wipes, but nothing helped.  Denies any pain with passing bowel movements, just notes blood with wiping, and notes some spotting in the underwear.  She had ongoing issues with breathing after chemical exposure. She was given Arnuity sample and albuterol to see if that helped, suspected RAD component (in 06/2020). The 2 weeks of Arnuity was very helpful, calmed it down, and the tightness in the chest resolved. She still has trouble if they use new chemicals in the bathrooms at work.   Has albuterol, but tried it once and it didn't seem to help much.  She uses a respirator at work when needed.  Rosacea--hasn't needed to use metrogel.  Skin improved when she started going to [facility] more frequently.  She no longer goes much, but skin has remained improved.  Chronicback pain--She is currently taking 400mg  of gabapentin at night, none during the day.  (last year was taking  300mg  in the morning, 600mg  at bedtime). Back is "fair to midlin". She has flexeril to use prn.She takes this the night before traveling, helpswith her spasms, and prn with pain.  This was refilled #40 on 09/19/2020. She uses tramadol very infrequently--causes some motion sensitivity, uses only when pain is very severe. Last filled 12/17/2018 #40. She is doing well on current regimen. (She previously had 2 cortisone injections for her back/leg pain, reportedly didn't help all that much, pain just changed some.She "can't take ibuprofen"--inflames back more, as does meloxicam. Aleve constipates her.) She uses Tylenol prn (can take it once or twice before it stops working). Heat and movement (staying active, stretches) seems to help the most.  Goiter: Hasn't seen Dr. Buddy Duty in years. Denies any change in size, trouble swallowing. Denies hair/skin/bowel changes.   Lab Results  Component Value Date   TSH 0.896 01/27/2020   Immunization History  Administered Date(s) Administered  . Influenza Split 08/01/2010, 06/15/2011, 07/01/2013, 06/24/2014  . Influenza, Quadrivalent, Recombinant, Inj, Pf 08/02/2019  . Influenza,inj,Quad PF,6+ Mos 06/01/2015  . Influenza-Unspecified 08/25/2017, 07/20/2018  . PFIZER(Purple Top)SARS-COV-2 Vaccination 11/29/2019, 12/22/2019  . Tdap 04/06/2011  . Zoster Recombinat (Shingrix) 03/13/2017, 06/03/2017   She didn't get COVID booster, and isn't interested now. Last Pap smear:06/2017, Dr. Philis Pique (sees her yearly, last 06/2020) Last mammogram:06/2020 Last colonoscopy: 2011 (in [place]) Last DEXA: never Dentist: twice yearly Ophtho: yearly Exercise:elliptical 45 mins 2x/week.  Walks 2-4 miles once a week.  Some interval training (walk/run,  planks). Recently added in wrist weights while on the elliptical. Lipids: Lab Results  Component Value Date   CHOL 154 12/17/2018   HDL 58 12/17/2018   LDLCALC 80 12/17/2018   TRIG 82 12/17/2018   CHOLHDL 2.7  12/17/2018   vitamin D-OH 59.1 in 06/2020 (not taking supplements)  Lab Results  Component Value Date   WBC 5.1 07/04/2020   HGB 15.0 07/04/2020   HCT 44.5 07/04/2020   MCV 96 07/04/2020   PLT 236 07/04/2020   Negative Quantiferon Gold in 06/2020.   PMH, PSH, SH and FH reviewed and updated  Outpatient Encounter Medications as of 12/21/2020  Medication Sig Note  . cholecalciferol (VITAMIN D3) 25 MCG (1000 UNIT) tablet Take 1,000 Units by mouth daily. 12/21/2020: 4 times a week  . gabapentin (NEURONTIN) 100 MG capsule TAKE 1 MID-DAY, INCREASING UP TO 3 PILLS, AS DIRECTED (IN ADDITION TO THE 300MG  CAPSULE) 12/21/2020: 1 qhs  . gabapentin (NEURONTIN) 300 MG capsule TAKE 1 CAPSULE BY MOUTH THREE TIMES A DAY 12/21/2020: 1 qhs  . norethindrone-ethinyl estradiol (FEMHRT 1/5) 1-5 MG-MCG TABS Take 1 tablet by mouth daily.   . polyethylene glycol (MIRALAX / GLYCOLAX) packet Take 6 g by mouth daily.  12/23/2019: 1/2 capful daily  . albuterol (VENTOLIN HFA) 108 (90 Base) MCG/ACT inhaler Inhale 2 puffs into the lungs every 6 (six) hours as needed for wheezing or shortness of breath. (Patient not taking: Reported on 12/21/2020)   . cyclobenzaprine (FLEXERIL) 10 MG tablet TAKE 1 TABLET BY MOUTH THREE TIMES A DAY AS NEEDED FOR MUSCLE SPASMS (Patient not taking: Reported on 12/21/2020)   . traMADol (ULTRAM) 50 MG tablet TAKE 1 TABLET BY MOUTH EVERY 6 TO 8 HOURS AS NEEDED FOR PAIN (Patient not taking: Reported on 12/21/2020) 12/23/2019: Uses very infrequently for severe pain  . [DISCONTINUED] estradiol (ESTRACE) 0.5 MG tablet estradiol 0.5 mg tablet  Take 1 tablet every day by oral route.   . [DISCONTINUED] Fluticasone Furoate (ARNUITY ELLIPTA) 200 MCG/ACT AEPB Inhale 1 puff into the lungs daily. (Patient not taking: Reported on 12/21/2020)    No facility-administered encounter medications on file as of 12/21/2020.   Allergies  Allergen Reactions  . Aspirin   . Ibuprofen Other (See Comments)    ROS: The  patient denies anorexia, fever, headaches, vision changes, decreased hearing, ear pain, sore throat, breast concerns, chest pain, palpitations, dizziness, syncope, dyspnea on exertion, cough, swelling, nausea, vomiting, diarrhea, abdominal pain, melena, indigestion/heartburn, hematuria, incontinence, dysuria, vaginalbleeding,discharge, odor or itch, genital lesions, numbness, tingling, weakness, tremor, suspicious skin lesions, depression, anxiety, abnormal bleeding/bruising, or enlarged lymph nodes.  Menopausal--on HRT, hot flashes at night per HPI (able to sleep through them since taking gabapentin qHS) +constipation (mainly wide caliber stools)--controlled by Miralax.  Anal fissure continues to bleed, per HPI +chronic low back pain, chronic, stable/controlled Tinnitus, stable/unchanged (not bothered much by any hearing loss) Mild allergies, controlled by OTC antihistamines (uses either cetirizine or claritin prn with good results) Atypical combined nevus was removed 01/2020.  She denies any skin concerns or mole changes.    PHYSICAL EXAM:  BP 134/78   Pulse 68   Ht 5\' 8"  (1.727 m)   Wt 184 lb 6.4 oz (83.6 kg)   LMP 02/04/2014   BMI 28.04 kg/m   Wt Readings from Last 3 Encounters:  12/21/20 184 lb 6.4 oz (83.6 kg)  07/04/20 182 lb 12.8 oz (82.9 kg)  03/07/20 184 lb (83.5 kg)    General Appearance:  Alert, cooperative, appears  stated age.  Head:  Normocephalic, without obvious abnormality, atraumatic   Eyes:  PERRL, conjunctiva/corneas clear, EOM's intact, fundi benign   Ears:  Normal TM's and external ear canals   Nose:  Not examined, wearing mask due to COVID-19 pandemic  Throat:  Not examined, wearing mask due to COVID-19 pandemic  Neck:  Supple, no lymphadenopathy; thyroid: mild generalized enlargement of thyroid, small distinct nodule palpable on the R, nontender. no carotid bruit or JVD   Back:  Spine nontender, no muscle spasm. No CVA  tenderness.No SI tenderness  Lungs:  Clear to auscultation bilaterally without wheezes, rales or ronchi; respirations unlabored   Chest Wall:  No tenderness or deformity   Heart:  Regular rate and rhythm, S1 and S2 normal, no murmur, rub or gallop   Breast Exam:  Deferred to GYN.    Abdomen:  Soft, non-tender, nondistended, normoactive bowel sounds, no masses, no hepatosplenomegaly   Genitalia:  Deferred to GYN  Rectal:  Deferred to GYN/GI  Extremities:  No clubbing, cyanosis or edema.  Pulses:  2+ and symmetric all extremities   Skin:  Skin color, texture, turgor normal, no rashes.   Lymph nodes:  Cervical, supraclavicular, and axillary nodes normal   Neurologic:  Normal strength, sensation and gait; reflexes 2+ and symmetric throughout. Negative SLR   Psych: Normal mood, affect, hygiene and grooming   ASSESSMENT/PLAN:  Annual physical exam - Plan: POCT Urinalysis DIP (Proadvantage Device), Lipid panel, Comprehensive metabolic panel  Goiter - Stable/unchanged  Screen for colon cancer - Plan: Ambulatory referral to Gastroenterology  Anal fissure - Declined Diltiazem gel.  Will discuss with GI - Plan: Ambulatory referral to Gastroenterology  Need for Tdap vaccination - Plan: Tdap vaccine greater than or equal to 7yo IM  Hot flashes - at night, tolerable with gabapentin. Unclear etiology, on HRT from GYN. w/u for night sweats negative (no longer sweating)  Mild intermittent reactive airway disease without complication - related to chemical exposures.  Responded to Arnuity with last flare.  To use albuterol prn, avoid exposures  Chronic low back pain without sciatica, unspecified back pain laterality - Controlled.  Has flexeril to use prn spasm, tramadol prn severe pain, and is on nightly gabapentin   Pt requesting screening for DM and lipids (wanting TG checked, given her reported weight gain)   Discussed monthly self  breast exams and yearly mammograms; at least 30 minutes of aerobic activity at least 5 days/week, weight-bearing exercise at least 2-3x/wk; proper sunscreen use reviewed; healthy diet, including goals of calcium and vitamin D intake and alcohol recommendations (less than or equal to 1 drink/day) reviewed; regular seatbelt use; changing batteries in smoke detectors. Immunization recommendations discussed--continue yearly flu shots. Tetanus booster given today. COVID booster recommended, declined. Colonoscopy recommendations reviewed, due. Prefers to go locally, not back to [place].  Referred. Cont to follow with yearly GYN visits.

## 2020-12-21 ENCOUNTER — Encounter: Payer: Self-pay | Admitting: Family Medicine

## 2020-12-21 ENCOUNTER — Ambulatory Visit (INDEPENDENT_AMBULATORY_CARE_PROVIDER_SITE_OTHER): Payer: BC Managed Care – PPO | Admitting: Family Medicine

## 2020-12-21 ENCOUNTER — Other Ambulatory Visit: Payer: Self-pay

## 2020-12-21 VITALS — BP 134/78 | HR 68 | Ht 68.0 in | Wt 184.4 lb

## 2020-12-21 DIAGNOSIS — Z1211 Encounter for screening for malignant neoplasm of colon: Secondary | ICD-10-CM

## 2020-12-21 DIAGNOSIS — R232 Flushing: Secondary | ICD-10-CM

## 2020-12-21 DIAGNOSIS — K602 Anal fissure, unspecified: Secondary | ICD-10-CM | POA: Diagnosis not present

## 2020-12-21 DIAGNOSIS — Z23 Encounter for immunization: Secondary | ICD-10-CM | POA: Diagnosis not present

## 2020-12-21 DIAGNOSIS — Z0000 Encounter for general adult medical examination without abnormal findings: Secondary | ICD-10-CM | POA: Diagnosis not present

## 2020-12-21 DIAGNOSIS — E049 Nontoxic goiter, unspecified: Secondary | ICD-10-CM | POA: Diagnosis not present

## 2020-12-21 DIAGNOSIS — M545 Low back pain, unspecified: Secondary | ICD-10-CM

## 2020-12-21 DIAGNOSIS — G8929 Other chronic pain: Secondary | ICD-10-CM

## 2020-12-21 DIAGNOSIS — J4520 Mild intermittent asthma, uncomplicated: Secondary | ICD-10-CM

## 2020-12-21 DIAGNOSIS — Z13220 Encounter for screening for lipoid disorders: Secondary | ICD-10-CM | POA: Diagnosis not present

## 2020-12-21 LAB — POCT URINALYSIS DIP (PROADVANTAGE DEVICE)
Bilirubin, UA: NEGATIVE
Blood, UA: NEGATIVE
Glucose, UA: NEGATIVE mg/dL
Ketones, POC UA: NEGATIVE mg/dL
Leukocytes, UA: NEGATIVE
Nitrite, UA: NEGATIVE
Protein Ur, POC: NEGATIVE mg/dL
Specific Gravity, Urine: 1.025
Urobilinogen, Ur: NEGATIVE
pH, UA: 5.5 (ref 5.0–8.0)

## 2020-12-22 LAB — COMPREHENSIVE METABOLIC PANEL
ALT: 12 IU/L (ref 0–32)
AST: 15 IU/L (ref 0–40)
Albumin/Globulin Ratio: 1.6 (ref 1.2–2.2)
Albumin: 4.6 g/dL (ref 3.8–4.9)
Alkaline Phosphatase: 81 IU/L (ref 44–121)
BUN/Creatinine Ratio: 16 (ref 9–23)
BUN: 13 mg/dL (ref 6–24)
Bilirubin Total: 0.4 mg/dL (ref 0.0–1.2)
CO2: 21 mmol/L (ref 20–29)
Calcium: 9.4 mg/dL (ref 8.7–10.2)
Chloride: 105 mmol/L (ref 96–106)
Creatinine, Ser: 0.81 mg/dL (ref 0.57–1.00)
Globulin, Total: 2.8 g/dL (ref 1.5–4.5)
Glucose: 93 mg/dL (ref 65–99)
Potassium: 4.7 mmol/L (ref 3.5–5.2)
Sodium: 144 mmol/L (ref 134–144)
Total Protein: 7.4 g/dL (ref 6.0–8.5)
eGFR: 86 mL/min/{1.73_m2} (ref >59)

## 2020-12-22 LAB — LIPID PANEL
Chol/HDL Ratio: 2.8 ratio (ref 0.0–4.4)
Cholesterol, Total: 157 mg/dL (ref 100–199)
HDL: 57 mg/dL (ref >39)
LDL Chol Calc (NIH): 82 mg/dL (ref 0–99)
Triglycerides: 96 mg/dL (ref 0–149)
VLDL Cholesterol Cal: 18 mg/dL (ref 5–40)

## 2020-12-27 ENCOUNTER — Encounter: Payer: Self-pay | Admitting: Family Medicine

## 2020-12-29 ENCOUNTER — Encounter: Payer: Self-pay | Admitting: Gastroenterology

## 2021-02-16 ENCOUNTER — Encounter: Payer: Self-pay | Admitting: Gastroenterology

## 2021-02-16 ENCOUNTER — Ambulatory Visit: Payer: BC Managed Care – PPO | Admitting: Gastroenterology

## 2021-02-16 VITALS — BP 124/74 | HR 72 | Ht 68.0 in | Wt 183.0 lb

## 2021-02-16 DIAGNOSIS — H9319 Tinnitus, unspecified ear: Secondary | ICD-10-CM | POA: Insufficient documentation

## 2021-02-16 DIAGNOSIS — K601 Chronic anal fissure: Secondary | ICD-10-CM | POA: Insufficient documentation

## 2021-02-16 DIAGNOSIS — Z1211 Encounter for screening for malignant neoplasm of colon: Secondary | ICD-10-CM

## 2021-02-16 NOTE — Patient Instructions (Addendum)
I have recommended a colonoscopy for colon cancer screening. I recommend that you speak to your insurance company prior to colonoscopy to understand what your out of pocket costs will be for a colonoscopy. Although we will schedule you for a screening colonoscopy, if a polyp is removed or something needs to biopsied, the procedure then becomes a diagnostic colonoscopy. A diagnostic colonoscopy may have different out of pocket costs for you based on your insurance plan.   We discussed anal fissure Treatment for fissure involves: - Sitz baths two to three times daily for pain relief - High fiber diet - increasing both dietary fiber (35 grams daily) and water intake  - Citrucel or Metamucil daily, add Miralax 17 g daily if needed to keep stools soft - Local therapy such as nitroglycerin, nifedipine, or diltiazem for 6-8 weeks - Surgery if local therapy is not helping  PROCEDURES: You have been scheduled for a colonoscopy. Please follow the written instructions given to you at your visit today. If you use inhalers (even only as needed), please bring them with you on the day of your procedure.  It was great seeing you today! Thank you for entrusting me with your care and choosing [facility].  Dr. Tarri Glenn

## 2021-02-16 NOTE — Progress Notes (Signed)
Referring Provider: Rita Ohara, MD Primary Care Physician:  Rita Ohara, MD  Reason for Consultation:  Rectal bleeding   IMPRESSION:  Recurrent rectal bleeding from a chronic posterior anal fissure: Discussed treatment for anal fissure. She declined prescription therapies. She is weighing surgery versus no treatment as she does not tolerate local therapy and did not feel that softening her stools has changed her symptoms. She is considering following up with [facility] to discuss options.  Need for colon cancer screening: Discussed colonoscopy. She did not want to sign the consent until she was able to have questions about anesthesia answered. I have asked Osvaldo Angst, CRNA to reach out to her by phone. She also had concerns about screening versus diagnostic colonoscopy that I tried to answer to the best of my ability. I asked her to call her insurance company for specific details about the difference in cost for her between a diagnostic and screening procedure.     PLAN: Obtain colonoscopy report from [facility] Obtain prior records from Dr. Marcello Moores Colonoscopy for colon cancer screening   Please see the "Patient Instructions" section for addition details about the plan.  HPI: Barbara Oconnor is a 55 y.o. female referred by Dr. Tomi Bamberger for rectal bleeding. The history is obtained through patient and review of CareEverywhere. She has a history of hemorrhoids that have been previously banded. Has had intermittent issues with a fissure that date back to college. Has had intermittent treatment for multiple anal fissures over the last 12 years - including prior consultations with 2 gastroenterologist and evaluation with a surgeon, Dr. Leighton Ruff. I do not have access to all of those records through [facility], but, I reviewed all records currently available in [place].  She has a history of anal fissures.  In the past they have been at the 6:00 and 12:00 positions.  They have  previously been treated with diltiazem, Metamucil, MiraLAX, wet wipes, Nitrogylcerin and Botox. Has not tolerated topical therapies. She recently declined her doctors offered for diltiazem gel. Currenlty using half a dose of Miralax daily.   She had colonoscopy in 2011 with Dr. Erlene Quan at [facility].  A 5 mm sigmoid polyp was removed however 10-year surveillance was recommended.  Neither the procedure note nor the pathology findings are available. She is due surveillance colonoscopy this year.    Flexible sigmoidoscopy with Dr. Shana Chute 05/26/2014 with monitored anesthesia care revealed a recurrent anal fissure.  Records from Dr. Ivin Booty note the fissure has been present for at least 4 years.  She was referred to the surgeon of her choice. It sounds like she saw Dr. Marcello Moores after this procedure and had Botox. However, the patient did not remember Dr. Shana Chute and felt that her procedure had been performed without anesthesia by Dr. Marcello Moores.   No known family history of colon cancer or polyps. No family history of uterine/endometrial cancer, pancreatic cancer or gastric/stomach cancer.   Past Medical History:  Diagnosis Date  . Anal fissure   . Complication of anesthesia    slow awakening  . Goiter 2002   u/s 2002, 2010 (Dr. Buddy Duty)  . H/O motion sickness   . Low back pain 10/2010  . Multiple thyroid nodules   . Rectal bleeding    anal fissure    Past Surgical History:  Procedure Laterality Date  . COLONOSCOPY  03/2010   Jule Ser); benign polyp, told to repeat 10 years  . EXAMINATION UNDER ANESTHESIA N/A 11/04/2014   Procedure: EXAM UNDER ANESTHESIA;  Surgeon: Leighton Ruff, MD;  Location: [facility];  Service: General;  Laterality: N/A;  . hemorrhoid banding    . SPHINCTEROTOMY N/A 11/04/2014   Procedure: CHEMICAL SPHINCTEROTOMY WITH BOTOX/ fisurectomy;  Surgeon: Leighton Ruff, MD;  Location: [place];  Service: General;  Laterality: N/A;  .  TEAR DUCT PROBING  2006   left  . TONSILLECTOMY  1986    Current Outpatient Medications  Medication Sig Dispense Refill  . cholecalciferol (VITAMIN D3) 25 MCG (1000 UNIT) tablet Take 1,000 Units by mouth daily.    . cyclobenzaprine (FLEXERIL) 10 MG tablet TAKE 1 TABLET BY MOUTH THREE TIMES A DAY AS NEEDED FOR MUSCLE SPASMS 40 tablet 0  . gabapentin (NEURONTIN) 100 MG capsule TAKE 1 MID-DAY, INCREASING UP TO 3 PILLS, AS DIRECTED (IN ADDITION TO THE 300MG  CAPSULE) 90 capsule 0  . gabapentin (NEURONTIN) 300 MG capsule TAKE 1 CAPSULE BY MOUTH THREE TIMES A DAY 270 capsule 0  . norethindrone-ethinyl estradiol (FEMHRT 1/5) 1-5 MG-MCG TABS Take 1 tablet by mouth daily.    . polyethylene glycol (MIRALAX / GLYCOLAX) packet Take 6 g by mouth daily.     Marland Kitchen albuterol (VENTOLIN HFA) 108 (90 Base) MCG/ACT inhaler Inhale 2 puffs into the lungs every 6 (six) hours as needed for wheezing or shortness of breath. (Patient not taking: Reported on 02/16/2021) 18 g 1   No current facility-administered medications for this visit.    Allergies as of 02/16/2021 - Review Complete 02/16/2021  Allergen Reaction Noted  . Aspirin    . Ibuprofen Other (See Comments)     Family History  Problem Relation Age of Onset  . Breast cancer Mother 20  . Hyperlipidemia Father   . Hypertension Father   . Non-Hodgkin's lymphoma Father 4       stage 4  . Bipolar disorder Sister   . Migraines Daughter   . Thyroid cancer Maternal Aunt   . Cancer Paternal Uncle        lymphatic  . Diabetes Paternal Grandmother   . Dementia Paternal Grandmother   . Heart disease Paternal Grandfather       Physical Exam: General:   Alert,  well-nourished, pleasant and cooperative in NAD Head:  Normocephalic and atraumatic. Eyes:  Sclera clear, no icterus.   Conjunctiva pink. Abdomen:  Soft, nontender, nondistended, normal bowel sounds, no rebound or guarding. No hepatosplenomegaly.   Rectal:   No chemical dermatitis. Large, chronic  appearing posterior fissure. There are posterior skin tags. There is an anterior scar from healed fissure. No external hemorrhoids. No prolapsing hemorrhoids. No rectal prolapse. Normal anocutaneous reflex. No stool in the rectal vault. No mass or fecal impaction. Normal anal resting tone. Chaperone: Melissa (images obtained at the time of her exam and are now included in Epic) Neurologic:  Alert and  oriented x4;  grossly nonfocal Skin:  Intact without significant lesions or rashes. Psych:  Alert and cooperative. Normal mood and affect.     Kimiah Hibner L. Tarri Glenn, MD, MPH 02/16/2021, 2:35 PM

## 2021-02-21 ENCOUNTER — Telehealth: Payer: Self-pay

## 2021-02-21 NOTE — Telephone Encounter (Signed)
Pt will need to sign consent at the time of her procedure. No further action required d/t having already been given prep instructions.

## 2021-02-21 NOTE — Progress Notes (Signed)
Records Request  Provider and/or Facility: Dr. Erasmo Downer and Dr. Erlene Quan  Document: Signed ROI  ROI has been faxed successfully to Dr. Erasmo Downer and Dr. Erlene Quan. Document(s) and fax confirmation(s) have been placed in the faxed file for future reference.

## 2021-02-21 NOTE — Telephone Encounter (Signed)
-----   Message from Thornton Park, MD sent at 02/17/2021  9:09 AM EDT ----- Many, many thanks for your help.  KLB ----- Message ----- From: Osvaldo Angst, CRNA Sent: 02/17/2021   8:59 AM EDT To: Thornton Park, MD  I just spoke with Barbara Oconnor, described the anesthetic and answered her many questions and concerns.  She expressed feeling more comfortable and will proceed with the procedure.  Thanks much,  JMN ----- Message ----- From: Thornton Park, MD Sent: 02/16/2021   4:07 PM EDT To: Osvaldo Angst, CRNA  Would you please call this patient? She has many questions about "her" anesthesia and was not comfortable signing her consent form for screening colonoscopy today.   I see in Epic that she had MAC for a flexible sigmoidoscopy 5 years ago. But, she denied having any anesthesia with the procedure.   Thank you!    KLB

## 2021-02-23 NOTE — Progress Notes (Signed)
Records received from [facility]/Dr. Marcello Moores. Placed all records on Dr. Tarri Glenn desk for her review.

## 2021-03-09 ENCOUNTER — Telehealth: Payer: Self-pay | Admitting: Gastroenterology

## 2021-03-09 NOTE — Telephone Encounter (Signed)
Review of 22 pages of records from [facility].  Seen in 2015 for lateral anal fissure with pain and bleeding that persisted despite nitroglycerin.  She was treated with fissurectomy and chemical sphincterotomy with Botox in 2016.  There was a prior history of banding for internal hemorrhoids.  It was noted that she had prior endoscopy with Dr. Earlean Shawl.

## 2021-05-25 ENCOUNTER — Other Ambulatory Visit: Payer: Self-pay | Admitting: Family Medicine

## 2021-06-07 ENCOUNTER — Ambulatory Visit (AMBULATORY_SURGERY_CENTER): Payer: BC Managed Care – PPO | Admitting: Gastroenterology

## 2021-06-07 ENCOUNTER — Other Ambulatory Visit: Payer: Self-pay

## 2021-06-07 ENCOUNTER — Encounter: Payer: Self-pay | Admitting: Gastroenterology

## 2021-06-07 VITALS — BP 114/68 | HR 71 | Temp 98.7°F | Resp 13 | Ht 68.0 in | Wt 183.0 lb

## 2021-06-07 DIAGNOSIS — Z1211 Encounter for screening for malignant neoplasm of colon: Secondary | ICD-10-CM | POA: Diagnosis not present

## 2021-06-07 DIAGNOSIS — Z86010 Personal history of colonic polyps: Secondary | ICD-10-CM

## 2021-06-07 DIAGNOSIS — D120 Benign neoplasm of cecum: Secondary | ICD-10-CM

## 2021-06-07 HISTORY — PX: COLONOSCOPY: SHX174

## 2021-06-07 MED ORDER — SODIUM CHLORIDE 0.9 % IV SOLN: 500.0000 mL | Freq: Once | INTRAVENOUS | Status: DC

## 2021-06-07 NOTE — Patient Instructions (Signed)
Thank you for allowing [place] to care for you today! Await final result of polyp removed, approximately 7-10 days by mail. Resume previous diet and medications today.  Return to your normal daily activities tomorrow. Consider a daily stool bulking agent such as Metamucil and Benefiber. Recommendation that you meet Dr Nadeen Landau to discuss surgical options for anal fissure.   YOU HAD AN ENDOSCOPIC PROCEDURE TODAY AT [facility]:   Refer to the procedure report that was given to you for any specific questions about what was found during the examination.  If the procedure report does not answer your questions, please call your gastroenterologist to clarify.  If you requested that your care partner not be given the details of your procedure findings, then the procedure report has been included in a sealed envelope for you to review at your convenience later.  YOU SHOULD EXPECT: Some feelings of bloating in the abdomen. Passage of more gas than usual.  Walking can help get rid of the air that was put into your GI tract during the procedure and reduce the bloating. If you had a lower endoscopy (such as a colonoscopy or flexible sigmoidoscopy) you may notice spotting of blood in your stool or on the toilet paper. If you underwent a bowel prep for your procedure, you may not have a normal bowel movement for a few days.  Please Note:  You might notice some irritation and congestion in your nose or some drainage.  This is from the oxygen used during your procedure.  There is no need for concern and it should clear up in a day or so.  SYMPTOMS TO REPORT IMMEDIATELY:  Following lower endoscopy (colonoscopy or flexible sigmoidoscopy):  Excessive amounts of blood in the stool  Significant tenderness or worsening of abdominal pains  Swelling of the abdomen that is new, acute  Fever of 100F or higher    For urgent or emergent issues, a gastroenterologist can be reached at any hour by  calling [phone]. Do not use MyChart messaging for urgent concerns.    DIET:  We do recommend a small meal at first, but then you may proceed to your regular diet.  Drink plenty of fluids but you should avoid alcoholic beverages for 24 hours.  ACTIVITY:  You should plan to take it easy for the rest of today and you should NOT DRIVE or use heavy machinery until tomorrow (because of the sedation medicines used during the test).    FOLLOW UP: Our staff will call the number listed on your records 48-72 hours following your procedure to check on you and address any questions or concerns that you may have regarding the information given to you following your procedure. If we do not reach you, we will leave a message.  We will attempt to reach you two times.  During this call, we will ask if you have developed any symptoms of COVID 19. If you develop any symptoms (ie: fever, flu-like symptoms, shortness of breath, cough etc.) before then, please call [phone].  If you test positive for Covid 19 in the 2 weeks post procedure, please call and report this information to [place].    If any biopsies were taken you will be contacted by phone or by letter within the next 1-3 weeks.  Please call us at [phone] if you have not heard about the biopsies in 3 weeks.    SIGNATURES/CONFIDENTIALITY: You and/or your care partner have signed paperwork which will be entered  into your electronic medical record.  These signatures attest to the fact that that the information above on your After Visit Summary has been reviewed and is understood.  Full responsibility of the confidentiality of this discharge information lies with you and/or your care-partner.

## 2021-06-07 NOTE — Progress Notes (Addendum)
Referring Provider: Rita Ohara, MD Primary Care Physician:  Rita Ohara, MD  Reason for Procedure:  Colon cancer screening   IMPRESSION:  Need for colon cancer screening History of colon polyp Chronic posterior anal fissure  PLAN: Colonoscopy in the [place] today  She remains an appropriate candidate for colonoscopy in the [place] today   HPI: Barbara Oconnor is a 55 y.o. female presents for screening colonoscopy.  Prior colonoscopy in 2011. Prior flexible sigmoidoscopy with Dr. Shana Chute in 2015 for hematochezia showed a recurrent anal fissure. Seen in the office 02/16/21 for rectal bleeding attributed to a chronic posterior fissure. Has continued to have intermittent bleeding.   She had colonoscopy in 2011 with Dr. Erlene Quan at [facility].  A 5 mm sigmoid polyp was removed however 10-year surveillance was recommended.  Neither the procedure note nor the pathology findings are available. She is due surveillance colonoscopy this year.     Flexible sigmoidoscopy with Dr. Shana Chute 05/26/2014 with monitored anesthesia care revealed a recurrent anal fissure.  Records from Dr. Ivin Booty note the fissure has been present for at least 4 years.  She was referred to the surgeon of her choice. It sounds like she saw Dr. Marcello Moores after this procedure and had Botox. However, the patient did not remember Dr. Shana Chute and felt that her procedure had been performed without anesthesia by Dr. Marcello Moores.       Past Medical History:  Diagnosis Date   Allergy    Anal fissure    Complication of anesthesia    slow awakening   Goiter 2002   u/s 2002, 2010 (Dr. Buddy Duty)   H/O motion sickness    Low back pain 10/2010   Multiple thyroid nodules    Rectal bleeding    anal fissure    Past Surgical History:  Procedure Laterality Date   COLONOSCOPY  03/2010   Jule Ser); benign polyp, told to repeat 10 years   EXAMINATION UNDER ANESTHESIA N/A 11/04/2014   Procedure: EXAM UNDER ANESTHESIA;  Surgeon:  Leighton Ruff, MD;  Location: [facility];  Service: General;  Laterality: N/A;   hemorrhoid banding     SPHINCTEROTOMY N/A 11/04/2014   Procedure: CHEMICAL SPHINCTEROTOMY WITH BOTOX/ fisurectomy;  Surgeon: Leighton Ruff, MD;  Location: [place];  Service: General;  Laterality: N/A;   TEAR DUCT PROBING  2006   left   TONSILLECTOMY  1986    Current Outpatient Medications  Medication Sig Dispense Refill   cholecalciferol (VITAMIN D3) 25 MCG (1000 UNIT) tablet Take 1,000 Units by mouth daily.     gabapentin (NEURONTIN) 100 MG capsule TAKE 1 MID-DAY, INCREASING UP TO 3 PILLS, AS DIRECTED (IN ADDITION TO THE '300MG'$  CAPSULE) 90 capsule 0   gabapentin (NEURONTIN) 300 MG capsule TAKE 1 CAPSULE BY MOUTH THREE TIMES A DAY 270 capsule 1   norethindrone-ethinyl estradiol (FEMHRT 1/5) 1-5 MG-MCG TABS Take 1 tablet by mouth daily.     polyethylene glycol (MIRALAX / GLYCOLAX) packet Take 6 g by mouth daily.      albuterol (VENTOLIN HFA) 108 (90 Base) MCG/ACT inhaler Inhale 2 puffs into the lungs every 6 (six) hours as needed for wheezing or shortness of breath. (Patient not taking: No sig reported) 18 g 1   cyclobenzaprine (FLEXERIL) 10 MG tablet TAKE 1 TABLET BY MOUTH THREE TIMES A DAY AS NEEDED FOR MUSCLE SPASMS 40 tablet 0   Current Facility-Administered Medications  Medication Dose Route Frequency Provider Last Rate Last Admin   0.9 %  sodium chloride infusion  500 mL Intravenous Once Thornton Park, MD        Allergies as of 06/07/2021 - Review Complete 06/07/2021  Allergen Reaction Noted   Aspirin     Ibuprofen Other (See Comments)     Family History  Problem Relation Age of Onset   Breast cancer Mother 53   Hyperlipidemia Father    Hypertension Father    Non-Hodgkin's lymphoma Father 52       stage 4   Bipolar disorder Sister    Thyroid cancer Maternal Aunt    Cancer Paternal Uncle        lymphatic   Diabetes Paternal Grandmother    Dementia  Paternal Grandmother    Heart disease Paternal Grandfather    Migraines Daughter    Colon cancer Neg Hx    Colon polyps Neg Hx    Esophageal cancer Neg Hx    Rectal cancer Neg Hx    Stomach cancer Neg Hx      Physical Exam: General:   Alert,  well-nourished, pleasant and cooperative in NAD Head:  Normocephalic and atraumatic. Eyes:  Sclera clear, no icterus.   Conjunctiva pink. Mouth:  No deformity or lesions.   Neck:  Supple; no masses or thyromegaly. Lungs:  Clear throughout to auscultation.   No wheezes. Heart:  Regular rate and rhythm; no murmurs. Abdomen:  Soft, non-tender, nondistended, normal bowel sounds, no rebound or guarding.  Msk:  Symmetrical. No boney deformities LAD: No inguinal or umbilical LAD Extremities:  No clubbing or edema. Neurologic:  Alert and  oriented x4;  grossly nonfocal Skin:  No obvious rash or bruise. Psych:  Alert and cooperative. Normal mood and affect.     Dennise Raabe L. Tarri Glenn, MD, MPH 06/07/2021, 9:38 AM

## 2021-06-07 NOTE — Progress Notes (Signed)
Report to PACU, RN, vss, BBS= Clear.  

## 2021-06-07 NOTE — Progress Notes (Signed)
Check-in aer  V/s-[place]

## 2021-06-07 NOTE — Op Note (Signed)
[place] Patient Name: Barbara Oconnor Procedure Date: 06/07/2021 9:42 AM MRN: [id number] Endoscopist: Thornton Park MD, MD Age: 55 Referring MD:  Date of Birth: 1966-04-20 Gender: Female Account #: [id number] Procedure:                Colonoscopy Indications:              Screening for colorectal malignant neoplasm                           [facility] A 5 mm sigmoid                            polyp was removed however 10-year surveillance was                            recommended.                           Flexible sigmoidoscopy with Dr. Shearin9/10/2013                            with monitored anesthesia carerevealed a recurrent                            anal fissure.                           No known family history of colon cancer or polyps. Medicines:                Monitored Anesthesia Care Procedure:                Pre-Anesthesia Assessment:                           - Prior to the procedure, a History and Physical                            was performed, and patient medications and                            allergies were reviewed. The patient's tolerance of                            previous anesthesia was also reviewed. The risks                            and benefits of the procedure and the sedation                            options and risks were discussed with the patient.                            All questions were answered, and  informed consent                            was obtained. Prior Anticoagulants: The patient has                            taken no previous anticoagulant or antiplatelet                            agents. ASA Grade Assessment: II - A patient with                            mild systemic disease. After reviewing the risks                            and benefits, the patient was deemed in                             satisfactory condition to undergo the procedure.                           After obtaining informed consent, the colonoscope                            was passed under direct vision. Throughout the                            procedure, the patient's blood pressure, pulse, and                            oxygen saturations were monitored continuously. The                            CF [id number] was introduced through the anus                            and advanced to the 3 cm into the ileum. A second                            forward view of the right colon was performed. The                            colonoscopy was performed without difficulty. The                            patient tolerated the procedure well. The quality                            of the bowel preparation was good. The terminal                            ileum, ileocecal valve, appendiceal orifice, and  rectum were photographed. Scope In: 9:50:18 AM Scope Out: 10:03:00 AM Scope Withdrawal Time: 0 hours 9 minutes 1 second  Total Procedure Duration: 0 hours 12 minutes 42 seconds  Findings:                 Persistent posterior anal fissure was found on                            perianal exam.                           A 3 mm polyp was found in the cecum. The polyp was                            sessile. The polyp was removed with a cold snare.                            Resection and retrieval were complete. Estimated                            blood loss was minimal.                           The exam was otherwise without abnormality on                            direct and retroflexion views. Complications:            No immediate complications. Estimated blood loss:                            Minimal. Estimated Blood Loss:     Estimated blood loss was minimal. Impression:               - Persistent anal fissure found on perianal exam.                           - One 3 mm polyp in  the cecum, removed with a cold                            snare. Resected and retrieved.                           - The examination was otherwise normal on direct                            and retroflexion views. Recommendation:           - Patient has a contact number available for                            emergencies. The signs and symptoms of potential                            delayed complications were discussed with the  patient. Return to normal activities tomorrow.                            Written discharge instructions were provided to the                            patient.                           - Resume previous diet.                           - Consider daily stool bulking agent such as                            Metamucil or Benefiber.                           - I recommend that you meet with Dr. Nadeen Landau to discuss surgical options for your fissure.                           - Continue present medications.                           - Await pathology results.                           - Repeat colonoscopy date to be determined after                            pending pathology results are reviewed for                            surveillance.                           - Emerging evidence supports eating a diet of                            fruits, vegetables, grains, calcium, and yogurt                            while reducing red meat and alcohol may reduce the                            risk of colon cancer.                           - Thank you for allowing me to be involved in your                            colon cancer prevention. Thornton Park MD, MD 06/07/2021 10:12:34 AM This report has been signed  electronically.

## 2021-06-07 NOTE — Progress Notes (Signed)
Called to room to assist during endoscopic procedure.  Patient ID and intended procedure confirmed with present staff. Received instructions for my participation in the procedure from the performing physician.  

## 2021-06-08 ENCOUNTER — Telehealth: Payer: Self-pay

## 2021-06-08 ENCOUNTER — Encounter: Payer: Self-pay | Admitting: *Deleted

## 2021-06-08 DIAGNOSIS — R6889 Other general symptoms and signs: Secondary | ICD-10-CM | POA: Diagnosis not present

## 2021-06-08 DIAGNOSIS — Z6827 Body mass index (BMI) 27.0-27.9, adult: Secondary | ICD-10-CM | POA: Diagnosis not present

## 2021-06-08 DIAGNOSIS — Z01419 Encounter for gynecological examination (general) (routine) without abnormal findings: Secondary | ICD-10-CM | POA: Diagnosis not present

## 2021-06-08 NOTE — Telephone Encounter (Signed)
-----   Message from Thornton Park, MD sent at 06/07/2021 10:19 AM EDT ----- Referral to Dr. Nadeen Landau for posterior anal fissure. Previously saw Dr. Marcello Moores and would prefer to see Dr. Dema Severin this time.  Thank you.  KLB

## 2021-06-08 NOTE — Telephone Encounter (Signed)
Referral, records, demographic and insurance information faxed to [facility].   Lm on vm for patient to return call.

## 2021-06-09 ENCOUNTER — Telehealth: Payer: Self-pay | Admitting: *Deleted

## 2021-06-09 NOTE — Telephone Encounter (Signed)
  Follow up Call-  Call back number 06/07/2021  Post procedure Call Back phone  # [phone]  Permission to leave phone message Yes  Some recent data might be hidden     Patient questions:  Do you have a fever, pain , or abdominal swelling? No. Pain Score  0 *  Have you tolerated food without any problems? Yes.    Have you been able to return to your normal activities? Yes.    Do you have any questions about your discharge instructions: Diet   No. Medications  No. Follow up visit  No.  Do you have questions or concerns about your Care? No.  Actions: * If pain score is 4 or above: No action needed, pain <4.  Have you developed a fever since your procedure? no  2.   Have you had an respiratory symptoms (SOB or cough) since your procedure? no  3.   Have you tested positive for COVID 19 since your procedure no  4.   Have you had any family members/close contacts diagnosed with the COVID 19 since your procedure?  no   If yes to any of these questions please route to Joylene John, RN and Joella Prince, RN

## 2021-06-12 ENCOUNTER — Encounter: Payer: Self-pay | Admitting: Gastroenterology

## 2021-06-12 NOTE — Telephone Encounter (Signed)
Mychart message sent to patient.

## 2021-06-18 ENCOUNTER — Encounter: Payer: Self-pay | Admitting: Family Medicine

## 2021-06-20 NOTE — Progress Notes (Deleted)
  Patient sent the following message on 9/25: "The tip of my left thumb has been numb for about 12 days. I have also been getting "shocks" in other parts of my hands when I bump or strain them. For the last week I have been icing my wrist and hand and wearing a wrist brace when I work. The numbness and needle sensation is spreading down the pad of my thumb to the first knuckle. The only thing I can think of that I've done differently lately is trying a rowing machine at the health club. Not sure what kind of doctor I should go see about this or how soon. It has been pretty uncomfortable at times. Your recommendation is greatly appreciated."  At her last visit in 11/2020,  she had been taking gabapentin 400mg , which helped her sleep through hot flashes. H/o chronic back pain, no known neck issues.     ASSESSMENT/PLAN:  Offer flu shot. Enter COVID boosters and offer

## 2021-06-21 ENCOUNTER — Ambulatory Visit: Payer: BC Managed Care – PPO | Admitting: Family Medicine

## 2021-06-25 NOTE — Progress Notes (Signed)
Chief Complaint  Patient presents with   Numbness    In left thumb. Started at the tip and then moved down to the first knuckle. Has sharp pains from time to time. Started 3 weeks ago. Used ice and did not help x 1 week.     Patient sent the following message on 9/25: "The tip of my left thumb has been numb for about 12 days. I have also been getting "shocks" in other parts of my hands when I bump or strain them. For the last week I have been icing my wrist and hand and wearing a wrist brace when I work. The numbness and needle sensation is spreading down the pad of my thumb to the first knuckle. The only thing I can think of that I've done differently lately is trying a rowing machine at the health club. Not sure what kind of doctor I should go see about this or how soon. It has been pretty uncomfortable at times. Your recommendation is greatly appreciated."   H/o chronic back pain, no known neck issues. She denies any neck pain.  She reports that the numbness is at the very tip of the thumb, and a little on the outside (radial) and central portion of the pad of the thumb.  Sometimes feels some "shocks". Hurts a lot before she goes to bed.  Used the rower only twice, pain started after that. Never noticed bruising/discoloration. Had chiropractor adjustments around that time as well (who suggested rowing). Started after her first adjustment.  Had 2 more adjustments after that. She states he tried doing something with her neck which didn't work.  Denies any wrist pain.  (Had some pain in the back of the hand and into the arm only from wearing the wrist brace). Tried wearing a wrist brace for a week, didn't help with thumb numbness. Has some aching at the base of her thumb sometimes.  She is asking to reduce the dose of flexeril--wanting it not to last as long. She tried cutting the tablets in half, but that made her mouth numb, so asking for lower dose of Flexeril.   She is also asking about  decreasing the gabapentin dose back to 400mg . She takes this for hot flashes.  The dose was increased to 600mg  over the summer.  She would like to cut back on the dose, thinks it might make her fuzzy with her memory. She needs refill of the 100mg  dose. (Has 300mg  only left at home).   PMH, PSH, SH reviewed  Outpatient Encounter Medications as of 06/26/2021  Medication Sig Note   cholecalciferol (VITAMIN D3) 25 MCG (1000 UNIT) tablet Take 1,000 Units by mouth daily. 12/21/2020: 4 times a week   gabapentin (NEURONTIN) 300 MG capsule TAKE 1 CAPSULE BY MOUTH THREE TIMES A DAY (Patient taking differently: 600 mg at bedtime.)    norethindrone-ethinyl estradiol (FEMHRT 1/5) 1-5 MG-MCG TABS Take 1 tablet by mouth daily.    polyethylene glycol (MIRALAX / GLYCOLAX) packet Take 15 g by mouth daily.    albuterol (VENTOLIN HFA) 108 (90 Base) MCG/ACT inhaler Inhale 2 puffs into the lungs every 6 (six) hours as needed for wheezing or shortness of breath. (Patient not taking: No sig reported) 02/16/2021: On hand   cyclobenzaprine (FLEXERIL) 10 MG tablet TAKE 1 TABLET BY MOUTH THREE TIMES A DAY AS NEEDED FOR MUSCLE SPASMS (Patient not taking: Reported on 06/26/2021)    gabapentin (NEURONTIN) 100 MG capsule TAKE 1 MID-DAY, INCREASING UP TO 3 PILLS,  AS DIRECTED (IN ADDITION TO THE 300MG  CAPSULE) (Patient not taking: Reported on 06/26/2021) 06/26/2021: Ran out   No facility-administered encounter medications on file as of 06/26/2021.   Allergies  Allergen Reactions   Aspirin    Ibuprofen Other (See Comments)   ROS:  no fever, chills, URI symptoms, headaches, neck pain, chest pain, shortness of breath, GI complaints, bleeding or rashes. Thumb numbness per HPI.  Chronic back pain, stable.   Denies weakness, tremor, numbness elsewhere.    PHYSICAL EXAM:  BP 128/76   Pulse 76   Ht 5\' 8"  (1.727 m)   Wt 185 lb (83.9 kg)   LMP 02/04/2014   BMI 28.13 kg/m   Well-appearing, pleasant female, in no distress HEENT:  conjunctiva and sclera are clear, EOMI, wearing mask Neck: no spinal tenderness, no muscle spasm. No lymphadenopathy or mass Extremities:  negative Tinel, phalen. Decreased subjective sensation over the palmar aspect of the distal phalanx of the L thumb. She is also somewhat tender to palpation over the pad of the thumb.  It is soft, symmetric with the feel of her R thumb.  No evidence of any foreign body or break in the skin.   2+ pulses. 2+ DTRs in upper extremities, symmetric. Psych: normal mood, affect, hygiene and grooming.     ASSESSMENT/PLAN:   Neuropathy - focal numbness of L thumb, unclear etiology.  Ddx reviewed with pt.  Trial of NSAID (vs steroids if she can't tolerate). Hand surgeon if persists - Plan: meloxicam (MOBIC) 15 MG tablet  Numbness of left thumb - Plan: meloxicam (MOBIC) 15 MG tablet  Muscle spasm of back - sporadic.  Try lower dose of flexeril to use prn to minimize SE - Plan: cyclobenzaprine (FLEXERIL) 5 MG tablet  Hot flashes - did well with 600mg  qHS dose during the summer.  Rx 100mg  caps so that she can titrate down with cooler weather. - Plan: gabapentin (NEURONTIN) 100 MG capsule  Refer to hand surgeon if not better. ([place] at [facility])    Take meloxicam once daily with food. This is to treat any inflammation of nerves that may be contributing to your numbness and discomfort in the left thumb.   If it bothers your stomach, you can cut the dose in half.  You may take it regularly until your pain/numbness has completely resolved (expect 7-10 days, may take the full #15 if needed).  We refilled the 100mg  gabapentin so that eventually you can titrate down from the 600mg  at bedtime.  Given ongoing pain right now, I would wait until the thumb feels better.  Then go down to 500mg  for a week, then can further decrease to 400mg , if desired.  If this is effective, you can always change to a 400mg  dose; let us know and we can change the  prescription.  We sent in a prescription for the lower dose of flexeril, in hopes to minimize the sedating side effects.

## 2021-06-26 ENCOUNTER — Ambulatory Visit: Payer: BC Managed Care – PPO | Admitting: Family Medicine

## 2021-06-26 ENCOUNTER — Encounter: Payer: Self-pay | Admitting: Family Medicine

## 2021-06-26 ENCOUNTER — Other Ambulatory Visit: Payer: Self-pay

## 2021-06-26 VITALS — BP 128/76 | HR 76 | Ht 68.0 in | Wt 185.0 lb

## 2021-06-26 DIAGNOSIS — R232 Flushing: Secondary | ICD-10-CM | POA: Diagnosis not present

## 2021-06-26 DIAGNOSIS — M62830 Muscle spasm of back: Secondary | ICD-10-CM | POA: Diagnosis not present

## 2021-06-26 DIAGNOSIS — R200 Anesthesia of skin: Secondary | ICD-10-CM | POA: Diagnosis not present

## 2021-06-26 DIAGNOSIS — G629 Polyneuropathy, unspecified: Secondary | ICD-10-CM | POA: Diagnosis not present

## 2021-06-26 MED ORDER — MELOXICAM 15 MG PO TABS: 15.0000 mg | ORAL_TABLET | Freq: Every day | ORAL | 0 refills | Status: DC

## 2021-06-26 MED ORDER — CYCLOBENZAPRINE HCL 5 MG PO TABS: 5.0000 mg | ORAL_TABLET | Freq: Three times a day (TID) | ORAL | 0 refills | Status: DC | PRN

## 2021-06-26 MED ORDER — GABAPENTIN 100 MG PO CAPS: ORAL_CAPSULE | ORAL | 0 refills | Status: DC

## 2021-06-26 NOTE — Patient Instructions (Signed)
  Take meloxicam once daily with food. This is to treat any inflammation of nerves that may be contributing to your numbness and discomfort in the left thumb.   If it bothers your stomach, you can cut the dose in half.  You may take it regularly until your pain/numbness has completely resolved (expect 7-10 days, may take the full #15 if needed).  We refilled the 100mg  gabapentin so that eventually you can titrate down from the 600mg  at bedtime.  Given ongoing pain right now, I would wait until the thumb feels better.  Then go down to 500mg  for a week, then can further decrease to 400mg , if desired.  If this is effective, you can always change to a 400mg  dose; let us know and we can change the prescription.  We sent in a prescription for the lower dose of flexeril, in hopes to minimize the sedating side effects.

## 2021-06-27 DIAGNOSIS — Z1231 Encounter for screening mammogram for malignant neoplasm of breast: Secondary | ICD-10-CM | POA: Diagnosis not present

## 2021-07-07 ENCOUNTER — Telehealth: Payer: Self-pay | Admitting: Family Medicine

## 2021-07-07 ENCOUNTER — Other Ambulatory Visit: Payer: Self-pay

## 2021-07-07 DIAGNOSIS — M79643 Pain in unspecified hand: Secondary | ICD-10-CM

## 2021-07-07 DIAGNOSIS — R200 Anesthesia of skin: Secondary | ICD-10-CM

## 2021-07-07 NOTE — Telephone Encounter (Signed)
Pt called and stated that she would like to go ahead and have the referral placed for a hand specialist because nothing seems to be helping with the pain.

## 2021-07-13 ENCOUNTER — Ambulatory Visit: Payer: BC Managed Care – PPO | Admitting: Orthopedic Surgery

## 2021-07-13 ENCOUNTER — Other Ambulatory Visit: Payer: Self-pay

## 2021-07-13 DIAGNOSIS — R202 Paresthesia of skin: Secondary | ICD-10-CM | POA: Diagnosis not present

## 2021-07-13 DIAGNOSIS — R200 Anesthesia of skin: Secondary | ICD-10-CM

## 2021-07-13 NOTE — Progress Notes (Signed)
Office Visit Note   Patient: Barbara Oconnor           Date of Birth: October 07, 1965           MRN: [id number] Visit Date: 07/13/2021              Requested by: Rita Ohara, [place],  [place] [address] PCP: Rita Ohara, MD   Assessment & Plan: Visit Diagnoses:  1. Numbness and tingling in left hand     Plan: Discussed that her numbness and tingling in the thumb and index fingers most consistent with carpal tunnel syndrome.  She has no history of thumb trauma or injury that suggest isolated injury to the digital nerve of the thumb.  She has no other neurologic symptoms. She does have numbness in the thumb and index finger with several provocative carpal tunnel tests.  At this point, we will get an EMG/nerve conduction study of the left upper extremity to further characterize her potential problem.  Follow-Up Instructions: No follow-ups on file.   Orders:  No orders of the defined types were placed in this encounter.  No orders of the defined types were placed in this encounter.     Procedures: No procedures performed   Clinical Data: No additional findings.   Subjective: Chief Complaint  Patient presents with   Other    Left thumb numbness    This a 55 year old right-hand-dominant female who presents with left thumb numbness for 6 weeks.  She has no history of injury in this hand.  She says that her numbness started in the tip of the thumb and has been progressively moving more proximal.  She now has occasional shocking or zinging sensations in her hand.  This is again mostly in the thumb.  She denies any nocturnal symptoms though she notes that she wakes frequently with hot flashes.  She has difficulty with pinching, using a rowing machine, or driving secondary to discomfort in the thumb and occasionally index finger.  She denies any radiculopathy or neck symptoms.  Of note she does see a chiropractor for adjustments of her lower spine issues.  She has  been prescribed NSAIDs by her primary care provider which has not helped any.  She denies any changes in her balance, gait, or other neurologic issue.   Review of Systems   Objective: Vital Signs: LMP 02/04/2014   Physical Exam Constitutional:      Appearance: Normal appearance.  Cardiovascular:     Rate and Rhythm: Normal rate.     Pulses: Normal pulses.  Skin:    General: Skin is warm and dry.     Capillary Refill: Capillary refill takes 2 to 3 seconds.  Neurological:     Mental Status: She is alert.    Left Hand Exam   Tenderness  The patient is experiencing no tenderness.   Range of Motion  The patient has normal left wrist ROM.  Muscle Strength  The patient has normal left wrist strength.  Other  Erythema: absent Sensation: normal Pulse: present  Comments:  Sensation intact to light tough in all fingers.  Equivocal Tinel sign at wrist.  Positive carpal tunnel compression and Phalen's tests with symptoms into the thumb and index fingers. 5/5 thenar motor strength without atrophy.      Specialty Comments:  No specialty comments available.  Imaging: No results found.   PMFS History: Patient Active Problem List   Diagnosis Date Noted   Numbness and tingling in left  hand 07/13/2021   Chronic anal fissure 02/16/2021   Ringing in ears 02/16/2021   Irregular intermenstrual bleeding 08/15/2020   Fecal occult blood test positive 08/15/2020   Pain in left leg 06/24/2018   Low back pain 10/25/2010   Past Medical History:  Diagnosis Date   Allergy    Anal fissure    Complication of anesthesia    slow awakening   Goiter 2002   u/s 2002, 2010 (Dr. Buddy Duty)   H/O motion sickness    Low back pain 10/2010   Multiple thyroid nodules    Rectal bleeding    anal fissure    Family History  Problem Relation Age of Onset   Breast cancer Mother 40   Hyperlipidemia Father    Hypertension Father    Non-Hodgkin's lymphoma Father 36       stage 4   Bipolar  disorder Sister    Thyroid cancer Maternal Aunt    Cancer Paternal Uncle        lymphatic   Diabetes Paternal Grandmother    Dementia Paternal Grandmother    Heart disease Paternal Grandfather    Migraines Daughter    Colon cancer Neg Hx    Colon polyps Neg Hx    Esophageal cancer Neg Hx    Rectal cancer Neg Hx    Stomach cancer Neg Hx     Past Surgical History:  Procedure Laterality Date   COLONOSCOPY  03/2010   Jule Ser); benign polyp, told to repeat 10 years   EXAMINATION UNDER ANESTHESIA N/A 11/04/2014   Procedure: EXAM UNDER ANESTHESIA;  Surgeon: Leighton Ruff, MD;  Location: [facility];  Service: General;  Laterality: N/A;   hemorrhoid banding     SPHINCTEROTOMY N/A 11/04/2014   Procedure: CHEMICAL SPHINCTEROTOMY WITH BOTOX/ fisurectomy;  Surgeon: Leighton Ruff, MD;  Location: [facility];  Service: General;  Laterality: N/A;   TEAR DUCT PROBING  2006   left   TONSILLECTOMY  1986   Social History   Occupational History   Occupation: teaches pilates   Occupation: works at [employer] manager)  Tobacco Use   Smoking status: Never   Smokeless tobacco: Never  Vaping Use   Vaping Use: Never used  Substance and Sexual Activity   Alcohol use: Yes    Comment: 0-4 glasses of wine/week   Drug use: No   Sexual activity: Yes    Partners: Male    Birth control/protection: Surgical    Comment: husband had vasectomy

## 2021-07-17 ENCOUNTER — Telehealth: Payer: Self-pay | Admitting: Orthopedic Surgery

## 2021-07-17 ENCOUNTER — Encounter: Payer: Self-pay | Admitting: Physical Medicine and Rehabilitation

## 2021-07-17 NOTE — Telephone Encounter (Signed)
Contacted patient and all questions and concerns were answered.

## 2021-07-17 NOTE — Telephone Encounter (Signed)
Pt called requesting a call back concerning her referral. Please call pt at [phone].

## 2021-07-17 NOTE — Telephone Encounter (Signed)
Pt states she tried to contact benfield on mychart but it would not let her. "It said he needs to register her name to his account for her to message him."  CB [phone]

## 2021-07-18 ENCOUNTER — Encounter: Payer: Self-pay | Admitting: Radiology

## 2021-07-18 DIAGNOSIS — K602 Anal fissure, unspecified: Secondary | ICD-10-CM | POA: Diagnosis not present

## 2021-07-18 DIAGNOSIS — K603 Anal fistula: Secondary | ICD-10-CM | POA: Diagnosis not present

## 2021-07-18 NOTE — Telephone Encounter (Signed)
I advised patient that once she has established care (been seen in  the office she should automatically be able to send a message to our office. That otherwise we do not have to do anything else to get her access to this. I sent her a link to MyChart help desk for [facility].

## 2021-07-20 ENCOUNTER — Telehealth: Payer: Self-pay | Admitting: Physical Medicine and Rehabilitation

## 2021-07-20 NOTE — Telephone Encounter (Signed)
Pt asking for a time change for her appt on 07/28/21 and asking if it can be later that morning if possible. The best call back number is [phone]

## 2021-07-21 NOTE — Telephone Encounter (Signed)
Left message #1

## 2021-07-25 ENCOUNTER — Telehealth: Payer: Self-pay | Admitting: Physical Medicine and Rehabilitation

## 2021-07-25 NOTE — Telephone Encounter (Signed)
See previous message

## 2021-07-25 NOTE — Telephone Encounter (Signed)
Patient called she would like a call back. [phone]

## 2021-07-25 NOTE — Telephone Encounter (Signed)
Called patient and moved appointment to 0930.

## 2021-07-28 ENCOUNTER — Encounter: Payer: Self-pay | Admitting: Physical Medicine and Rehabilitation

## 2021-07-28 ENCOUNTER — Encounter: Payer: BC Managed Care – PPO | Admitting: Physical Medicine and Rehabilitation

## 2021-07-28 ENCOUNTER — Other Ambulatory Visit: Payer: Self-pay

## 2021-07-28 ENCOUNTER — Ambulatory Visit (INDEPENDENT_AMBULATORY_CARE_PROVIDER_SITE_OTHER): Payer: BC Managed Care – PPO | Admitting: Physical Medicine and Rehabilitation

## 2021-07-28 DIAGNOSIS — R202 Paresthesia of skin: Secondary | ICD-10-CM | POA: Diagnosis not present

## 2021-07-28 NOTE — Progress Notes (Signed)
Numbness in left thumb especially at tip. Shock feeling in left thumb with with certain motions. Right hand dominant No lotion per patient

## 2021-07-31 NOTE — Progress Notes (Signed)
Barbara Oconnor - 55 y.o. female MRN [id number]  Date of birth: 08/25/1966  Office Visit Note: Visit Date: 07/28/2021 PCP: Rita Ohara, MD Referred by: Rita Ohara, MD  Subjective: Chief Complaint  Patient presents with   Left Hand - Numbness, Pain   HPI:  Barbara Oconnor is a 55 y.o. female who comes in today at the request of Dr. Audria Nine for electrodiagnostic study of the Left upper extremities.  Patient is Right hand dominant.  She reports severe numbness in the left thumb especially the tip of the thumb.  [place] she gets a shocklike feeling into the thumb with certain movements.  She endorses some numbness and tingling in the radial 3 digits.  Dr. Tempie Donning thought this was more consistent with carpal tunnel syndrome.  No frank radicular symptoms although some neck and shoulder pain at times.  No prior electrodiagnostic studies.  No right-sided complaints.   ROS Otherwise per HPI.  Assessment & Plan: Visit Diagnoses:    ICD-10-CM   1. Paresthesia of skin  R20.2 NCV with EMG (electromyography)      Plan: Impression: Essentially NORMAL electrodiagnostic study of the left upper limb.  There is no significant electrodiagnostic evidence of nerve entrapment, brachial plexopathy or cervical radiculopathy.    As you know, purely sensory or demyelinating radiculopathies and chemical radiculitis may not be detected with this particular electrodiagnostic study.  Recommendations: 1.  Follow-up with referring physician. 2.  Continue current management of symptoms.  Consider cervical MRI if felt to be radicular in nature.  Consider diagnostic carpal tunnel injection.  Meds & Orders: No orders of the defined types were placed in this encounter.   Orders Placed This Encounter  Procedures   NCV with EMG (electromyography)    Follow-up: Return in about 2 weeks (around 08/11/2021) for Audria Nine, MD.   Procedures: No procedures performed  EMG & NCV Findings: All  nerve conduction studies (as indicated in the following tables) were within normal limits.    All examined muscles (as indicated in the following table) showed no evidence of electrical instability.    Impression: Essentially NORMAL electrodiagnostic study of the left upper limb.  There is no significant electrodiagnostic evidence of nerve entrapment, brachial plexopathy or cervical radiculopathy.    As you know, purely sensory or demyelinating radiculopathies and chemical radiculitis may not be detected with this particular electrodiagnostic study.  Recommendations: 1.  Follow-up with referring physician. 2.  Continue current management of symptoms.  Consider cervical MRI if felt to be radicular in nature.  Consider diagnostic carpal tunnel injection.  ___________________________ Laurence Spates FAAPMR Board Certified, American Board of Physical Medicine and Rehabilitation    Nerve Conduction Studies Anti Sensory Summary Table   Stim Site NR Peak (ms) Norm Peak (ms) P-T Amp (V) Norm P-T Amp Site1 Site2 Delta-P (ms) Dist (cm) Vel (m/s) Norm Vel (m/s)  Left Median Acr Palm Anti Sensory (2nd Digit)  31.5C  Wrist    3.3 <3.6 29.3 >10        Left Radial Anti Sensory (Base 1st Digit)  32.3C  Wrist    2.0 <3.1 17.8  Wrist Base 1st Digit 2.0 0.0    Left Ulnar Anti Sensory (5th Digit)  32.2C  Wrist    2.9 <3.7 33.0 >15.0 Wrist 5th Digit 2.9 14.0 48 >38   Motor Summary Table   Stim Site NR Onset (ms) Norm Onset (ms) O-P Amp (mV) Norm O-P Amp Site1 Site2 Delta-0 (ms) Dist (cm) Vel (  m/s) Norm Vel (m/s)  Left Median Motor (Abd Poll Brev)  32.5C  Wrist    3.4 <4.2 5.7 >5 Elbow Wrist 3.7 20.5 55 >50  Elbow    7.1  3.9         Left Ulnar Motor (Abd Dig Min)  32.4C  Wrist    3.0 <4.2 9.9 >3 B Elbow Wrist 2.9 19.5 67 >53  B Elbow    5.9  10.2  A Elbow B Elbow 1.3 10.0 77 >53  A Elbow    7.2  9.5          EMG   Side Muscle Nerve Root Ins Act Fibs Psw Amp Dur Poly Recrt Int Fraser Din Comment   Left 1stDorInt Ulnar C8-T1 Nml Nml Nml Nml Nml 0 Nml Nml   Left Abd Poll Brev Median C8-T1 Nml Nml Nml Nml Nml 0 Nml Nml   Left ExtDigCom   Nml Nml Nml Nml Nml 0 Nml Nml   Left Triceps Radial C6-7-8 Nml Nml Nml Nml Nml 0 Nml Nml   Left Deltoid Axillary C5-6 Nml Nml Nml Nml Nml 0 Nml Nml     Nerve Conduction Studies Anti Sensory Left/Right Comparison   Stim Site L Lat (ms) R Lat (ms) L-R Lat (ms) L Amp (V) R Amp (V) L-R Amp (%) Site1 Site2 L Vel (m/s) R Vel (m/s) L-R Vel (m/s)  Median Acr Palm Anti Sensory (2nd Digit)  31.5C  Wrist 3.3   29.3         Radial Anti Sensory (Base 1st Digit)  32.3C  Wrist 2.0   17.8   Wrist Base 1st Digit     Ulnar Anti Sensory (5th Digit)  32.2C  Wrist 2.9   33.0   Wrist 5th Digit 48     Motor Left/Right Comparison   Stim Site L Lat (ms) R Lat (ms) L-R Lat (ms) L Amp (mV) R Amp (mV) L-R Amp (%) Site1 Site2 L Vel (m/s) R Vel (m/s) L-R Vel (m/s)  Median Motor (Abd Poll Brev)  32.5C  Wrist 3.4   5.7   Elbow Wrist 55    Elbow 7.1   3.9         Ulnar Motor (Abd Dig Min)  32.4C  Wrist 3.0   9.9   B Elbow Wrist 67    B Elbow 5.9   10.2   A Elbow B Elbow 77    A Elbow 7.2   9.5            Waveforms:            Clinical History: No specialty comments available.     Objective:  VS:  HT:    WT:   BMI:     BP:   HR: bpm  TEMP: ( )  RESP:  Physical Exam Musculoskeletal:        General: No swelling, tenderness or deformity.     Comments: Inspection reveals no atrophy of the bilateral APB or FDI or hand intrinsics. There is no swelling, color changes, allodynia or dystrophic changes. There is 5 out of 5 strength in the bilateral wrist extension, finger abduction and long finger flexion. There is intact sensation to light touch in all dermatomal and peripheral nerve distributions. There is a negative Hoffmann's test bilaterally.  Skin:    General: Skin is warm and dry.     Findings: No erythema or rash.  Neurological:     General: No  focal deficit present.  Mental Status: She is alert and oriented to person, place, and time.     Motor: No weakness or abnormal muscle tone.     Coordination: Coordination normal.  Psychiatric:        Mood and Affect: Mood normal.        Behavior: Behavior normal.     Imaging: No results found.

## 2021-07-31 NOTE — Procedures (Signed)
EMG & NCV Findings: All nerve conduction studies (as indicated in the following tables) were within normal limits.    All examined muscles (as indicated in the following table) showed no evidence of electrical instability.    Impression: Essentially NORMAL electrodiagnostic study of the left upper limb.  There is no significant electrodiagnostic evidence of nerve entrapment, brachial plexopathy or cervical radiculopathy.    As you know, purely sensory or demyelinating radiculopathies and chemical radiculitis may not be detected with this particular electrodiagnostic study.  Recommendations: 1.  Follow-up with referring physician. 2.  Continue current management of symptoms.  Consider cervical MRI if felt to be radicular in nature.  Consider diagnostic carpal tunnel injection.  ___________________________ Laurence Spates FAAPMR Board Certified, American Board of Physical Medicine and Rehabilitation    Nerve Conduction Studies Anti Sensory Summary Table   Stim Site NR Peak (ms) Norm Peak (ms) P-T Amp (V) Norm P-T Amp Site1 Site2 Delta-P (ms) Dist (cm) Vel (m/s) Norm Vel (m/s)  Left Median Acr Palm Anti Sensory (2nd Digit)  31.5C  Wrist    3.3 <3.6 29.3 >10        Left Radial Anti Sensory (Base 1st Digit)  32.3C  Wrist    2.0 <3.1 17.8  Wrist Base 1st Digit 2.0 0.0    Left Ulnar Anti Sensory (5th Digit)  32.2C  Wrist    2.9 <3.7 33.0 >15.0 Wrist 5th Digit 2.9 14.0 48 >38   Motor Summary Table   Stim Site NR Onset (ms) Norm Onset (ms) O-P Amp (mV) Norm O-P Amp Site1 Site2 Delta-0 (ms) Dist (cm) Vel (m/s) Norm Vel (m/s)  Left Median Motor (Abd Poll Brev)  32.5C  Wrist    3.4 <4.2 5.7 >5 Elbow Wrist 3.7 20.5 55 >50  Elbow    7.1  3.9         Left Ulnar Motor (Abd Dig Min)  32.4C  Wrist    3.0 <4.2 9.9 >3 B Elbow Wrist 2.9 19.5 67 >53  B Elbow    5.9  10.2  A Elbow B Elbow 1.3 10.0 77 >53  A Elbow    7.2  9.5          EMG   Side Muscle Nerve Root Ins Act Fibs Psw Amp Dur Poly  Recrt Int Fraser Din Comment  Left 1stDorInt Ulnar C8-T1 Nml Nml Nml Nml Nml 0 Nml Nml   Left Abd Poll Brev Median C8-T1 Nml Nml Nml Nml Nml 0 Nml Nml   Left ExtDigCom   Nml Nml Nml Nml Nml 0 Nml Nml   Left Triceps Radial C6-7-8 Nml Nml Nml Nml Nml 0 Nml Nml   Left Deltoid Axillary C5-6 Nml Nml Nml Nml Nml 0 Nml Nml     Nerve Conduction Studies Anti Sensory Left/Right Comparison   Stim Site L Lat (ms) R Lat (ms) L-R Lat (ms) L Amp (V) R Amp (V) L-R Amp (%) Site1 Site2 L Vel (m/s) R Vel (m/s) L-R Vel (m/s)  Median Acr Palm Anti Sensory (2nd Digit)  31.5C  Wrist 3.3   29.3         Radial Anti Sensory (Base 1st Digit)  32.3C  Wrist 2.0   17.8   Wrist Base 1st Digit     Ulnar Anti Sensory (5th Digit)  32.2C  Wrist 2.9   33.0   Wrist 5th Digit 48     Motor Left/Right Comparison   Stim Site L Lat (ms) R Lat (ms) L-R Lat (  ms) L Amp (mV) R Amp (mV) L-R Amp (%) Site1 Site2 L Vel (m/s) R Vel (m/s) L-R Vel (m/s)  Median Motor (Abd Poll Brev)  32.5C  Wrist 3.4   5.7   Elbow Wrist 55    Elbow 7.1   3.9         Ulnar Motor (Abd Dig Min)  32.4C  Wrist 3.0   9.9   B Elbow Wrist 67    B Elbow 5.9   10.2   A Elbow B Elbow 77    A Elbow 7.2   9.5            Waveforms:

## 2021-08-03 ENCOUNTER — Other Ambulatory Visit: Payer: Self-pay

## 2021-08-03 ENCOUNTER — Ambulatory Visit: Payer: BC Managed Care – PPO | Admitting: Orthopedic Surgery

## 2021-08-03 ENCOUNTER — Encounter: Payer: Self-pay | Admitting: Orthopedic Surgery

## 2021-08-03 VITALS — BP 134/86 | HR 79 | Ht 68.0 in | Wt 185.0 lb

## 2021-08-03 DIAGNOSIS — R202 Paresthesia of skin: Secondary | ICD-10-CM

## 2021-08-03 DIAGNOSIS — R200 Anesthesia of skin: Secondary | ICD-10-CM | POA: Diagnosis not present

## 2021-08-03 NOTE — Progress Notes (Signed)
Office Visit Note   Patient: Barbara Oconnor           Date of Birth: [birth year]           MRN: [id number] Visit Date: 08/03/2021              Requested by: Rita Ohara, [place] [place],  [place] [address] PCP: Rita Ohara, MD   Assessment & Plan: Visit Diagnoses:  1. Numbness and tingling in left hand     Plan: We reviewed the recent electrodiagnostic study results which were essentially normal.  There is no EMG or nerve conduction study evidence of median nerve entrapment at the wrist.  She continues to describe numbness and tingling mostly in the thumb with electrical shocking sensations radiating down the thumb mostly on the ulnar side.  She continues to have equivocal provocative signs.  She does note that the timing of the symptoms started after she got a new watch which she wears constantly.  Discussed that her symptoms could be related to superficial radial nerve entrapment or Wartenberg syndrome.  She had questionable increased numbness with prolonged wrist flexion and ulnar deviation with forearm pronation.  The band of the watch does appear to be quite tight.  We discussed diagnostic carpal tunnel injection versus trying to wear the watch in the contralateral wrist and wear the brace at night.  She is going to go without the watch and see if this helps her symptoms.  We can see her back in several weeks if she still having difficulty.  Follow-Up Instructions: No follow-ups on file.   Orders:  No orders of the defined types were placed in this encounter.  No orders of the defined types were placed in this encounter.     Procedures: No procedures performed   Clinical Data: No additional findings.   Subjective: Chief Complaint  Patient presents with   Left Hand - Follow-up    This is a 55 year old right-hand-dominant female who presents with continued left thumb numbness going on for 2 months now.  She was previously seen several weeks ago at which  time she was describing zinging and shocking sensations radiating distally into her thumb mostly on the ulnar side.  She does not have any significant nocturnal symptoms at that time.  The still does not seem to wake her up at night.  She notes occasional numbness of the index and middle finger but her biggest problem is the electrical shock sensation she feels radiating into her thumb.  This occurs with certain movement such as pinching or gripping the steering wheel.  She does note that her symptoms started around the time she got a new smart watch.  She notes that she wears a watch every single day and tries to wear it every night.  The watchband appears to be quite tight.  She denies any symptoms or numbness at the dorsal aspect of the hand.  She denies any radiculopathy.  She denies any cervical spine pain.  She denies any limited range of motion of her neck.  She denies any symptoms involving the ring and small finger.   Review of Systems   Objective: Vital Signs: BP 134/86 (BP Location: Right Arm, Patient Position: Sitting, Cuff Size: Normal)   Pulse 79   Ht 5\' 8"  (1.727 m)   Wt 185 lb (83.9 kg)   LMP 02/04/2014   SpO2 98%   BMI 28.13 kg/m   Physical Exam Constitutional:  Appearance: Normal appearance.  Cardiovascular:     Rate and Rhythm: Normal rate.     Pulses: Normal pulses.  Pulmonary:     Effort: Pulmonary effort is normal.  Skin:    General: Skin is warm and dry.     Capillary Refill: Capillary refill takes less than 2 seconds.  Neurological:     Mental Status: She is alert.    Left Hand Exam   Tenderness  The patient is experiencing no tenderness.   Range of Motion  The patient has normal left wrist ROM.  Other  Erythema: absent Sensation: normal Pulse: present  Comments:  Equivocal provocative testing for carpal tunnel syndrome.  She has vague pressure or pain with Tinel sign at the wrist.  She has minimal worsening of her symptoms with carpal tunnel  compression or Phalen's test.  She reports mild worsening of her thumb numbness with pronation of the forearm with wrist flexion and ulnar deviation.  She has no Tinel sign at the radial aspect of the wrist around the superficial radial nerve.  She has no masses around the base of her thumb in the area of the digital nerves.  Sensation is intact with two-point discrimination of 6 mm or less in all fingers.  She has a negative Spurling sign.     Specialty Comments:  No specialty comments available.  Imaging: No results found.   PMFS History: Patient Active Problem List   Diagnosis Date Noted   Numbness and tingling in left hand 07/13/2021   Chronic anal fissure 02/16/2021   Ringing in ears 02/16/2021   Irregular intermenstrual bleeding 08/15/2020   Fecal occult blood test positive 08/15/2020   Pain in left leg 06/24/2018   Low back pain 10/25/2010   Past Medical History:  Diagnosis Date   Allergy    Anal fissure    Complication of anesthesia    slow awakening   Goiter 2002   u/s 2002, 2010 (Dr. Buddy Duty)   H/O motion sickness    Low back pain 10/2010   Multiple thyroid nodules    Rectal bleeding    anal fissure    Family History  Problem Relation Age of Onset   Breast cancer Mother 27   Hyperlipidemia Father    Hypertension Father    Non-Hodgkin's lymphoma Father 81       stage 4   Bipolar disorder Sister    Thyroid cancer Maternal Aunt    Cancer Paternal Uncle        lymphatic   Diabetes Paternal Grandmother    Dementia Paternal Grandmother    Heart disease Paternal Grandfather    Migraines Daughter    Colon cancer Neg Hx    Colon polyps Neg Hx    Esophageal cancer Neg Hx    Rectal cancer Neg Hx    Stomach cancer Neg Hx     Past Surgical History:  Procedure Laterality Date   COLONOSCOPY  03/2010   Jule Ser); benign polyp, told to repeat 10 years   EXAMINATION UNDER ANESTHESIA N/A 11/04/2014   Procedure: EXAM UNDER ANESTHESIA;  Surgeon: Leighton Ruff, MD;   Location: [facility];  Service: General;  Laterality: N/A;   hemorrhoid banding     SPHINCTEROTOMY N/A 11/04/2014   Procedure: CHEMICAL SPHINCTEROTOMY WITH BOTOX/ fisurectomy;  Surgeon: Leighton Ruff, MD;  Location: [place] [facility];  Service: General;  Laterality: N/A;   [place] DUCT PROBING  2006   left   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

## 2021-09-08 ENCOUNTER — Other Ambulatory Visit: Payer: Self-pay

## 2021-09-08 ENCOUNTER — Encounter (HOSPITAL_BASED_OUTPATIENT_CLINIC_OR_DEPARTMENT_OTHER): Payer: Self-pay | Admitting: Surgery

## 2021-09-08 ENCOUNTER — Ambulatory Visit (INDEPENDENT_AMBULATORY_CARE_PROVIDER_SITE_OTHER): Payer: BC Managed Care – PPO | Admitting: Orthopedic Surgery

## 2021-09-08 DIAGNOSIS — R202 Paresthesia of skin: Secondary | ICD-10-CM

## 2021-09-08 DIAGNOSIS — R200 Anesthesia of skin: Secondary | ICD-10-CM

## 2021-09-08 NOTE — Progress Notes (Signed)
Spoke w/ via phone for pre-op interview---pt Lab needs dos---- none per anesthesia, surgeon orders pending, sent request for orders to Dr. Dema Severin via Epic IB on 09/08/2021              Lab results------none COVID test -----patient states asymptomatic no test needed Arrive at -------1000 on 09/14/21 NPO after MN NO Solid Food.  Clear liquids from MN until---0900 Med rec completed Medications to take morning of surgery -----Albuterol inhaler prn, Flexeril prn, Fyavolv Diabetic medication -----n/a Patient instructed no nail polish to be worn day of surgery Patient instructed to bring photo id and insurance card day of surgery Patient aware to have Driver (ride ) / caregiver    for 24 hours after surgery - husband Barbara Oconnor Patient Special Instructions -----Bring inhaler Pre-Op special Istructions -----none Patient verbalized understanding of instructions that were given at this phone interview. Patient denies shortness of breath, chest pain, fever, cough at this phone interview.

## 2021-09-08 NOTE — Progress Notes (Signed)
Office Visit Note   Patient: Barbara Oconnor           Date of Birth: 08-15-1966           MRN: [id number] Visit Date: 09/08/2021              Requested by: Rita Ohara, [address],  [place] [address] PCP: Rita Ohara, MD   Assessment & Plan: Visit Diagnoses:  1. Numbness and tingling in left hand     Plan: Patient continues to have numbness at the radial portion of the thumb from the tip to the level of the IP joint.  The electrical shocking sensation resolved after removing the watch.  This thumb numbness is still quite bothersome.  She has a questionable Tinel sign at the radial aspect of the thumb.  We will get an MRI of her hand and thumb to see if there are any masses or lesions around the digital nerve to the thumb that could be explaining her symptoms.  I will call her with the results once it's completed.   Follow-Up Instructions: No follow-ups on file.   Orders:  No orders of the defined types were placed in this encounter.  No orders of the defined types were placed in this encounter.     Procedures: No procedures performed   Clinical Data: No additional findings.   Subjective: Chief Complaint  Patient presents with   Left Hand - Follow-up, Numbness    Says it is still numb    This is a 55 year old right-hand-dominant female who presents with continued left thumb numbness for months now.  The thumbness is from the tip of the thumb to the radial aspect at the level of the IP joint.  She previously describes "shocking" sensations that would be quite bothersome while she drove or used her hand.  This has resolved with removal of her tight watch.  The numbness in her thumb is still quite bothersome.  She has no numbness in her other fingers.  She denies any weakness.  She denies any other neurologic symptoms.  There was no inciting trauma or injury.  She has never noticed any lumps or masses around her thumb.  She was previously seen several  weeks ago at which time she was describing zinging and shocking sensations radiating distally into her thumb mostly on the ulnar side.  She does not have any significant nocturnal symptoms at that time.  The still does not seem to wake her up at night.  She notes occasional numbness of the index and middle finger but her biggest problem is the electrical shock sensation she feels radiating into her thumb.  This occurs with certain movement such as pinching or gripping the steering wheel.  She does note that her symptoms started around the time she got a new smart watch.  She notes that she wears a watch every single day and tries to wear it every night.  The watchband appears to be quite tight.  She denies any symptoms or numbness at the dorsal aspect of the hand.  She denies any radiculopathy.  She denies any cervical spine pain.  She denies any limited range of motion of her neck.  She denies any symptoms involving the ring and small finger.   Review of Systems   Objective: Vital Signs: LMP 02/04/2014   Physical Exam Constitutional:      Appearance: Normal appearance.  Cardiovascular:     Rate and Rhythm: Normal rate.  Pulses: Normal pulses.  Pulmonary:     Effort: Pulmonary effort is normal.  Skin:    General: Skin is warm and dry.     Capillary Refill: Capillary refill takes less than 2 seconds.  Neurological:     Mental Status: She is alert.    Left Hand Exam   Tenderness  The patient is experiencing no tenderness.   Range of Motion  The patient has normal left wrist ROM.  Muscle Strength  The patient has normal left wrist strength.  Other  Pulse: present  Comments:  Diminished sensation at radial border of thumb from tip to the IP joint.  Negative CT compression test.  Maybe worsening numbness with Phalen or Tinel at wrist.  Questionable Tinel at radial aspect of thumb at level of metacarpal.     Specialty Comments:  No specialty comments  available.  Imaging: No results found.   PMFS History: Patient Active Problem List   Diagnosis Date Noted   Numbness and tingling in left hand 07/13/2021   Chronic anal fissure 02/16/2021   Ringing in ears 02/16/2021   Irregular intermenstrual bleeding 08/15/2020   Fecal occult blood test positive 08/15/2020   Pain in left leg 06/24/2018   Low back pain 10/25/2010   Past Medical History:  Diagnosis Date   Allergy    Anal fissure    persistent anal fissure   Chronic back pain    Complication of anesthesia    slow awakening many years ago per pt on 09/08/2021   Goiter 2002   u/s 2002, 2010 (Dr. Buddy Duty)   H/O motion sickness    History of chemical exposure 03/07/2020   Pt was exposed to some sort of strong chemical that was used at her workplace to disinfect the bathrooms and other areas. After exposure, she experienced SOB, chest tightness, & cough.   Knee pain, bilateral 10/12/2020   Low back pain 10/2010   Multiple thyroid nodules    Rectal bleeding    anal fissure   Wears contact lenses    Wears glasses     Family History  Problem Relation Age of Onset   Breast cancer Mother 45   Hyperlipidemia Father    Hypertension Father    Non-Hodgkin's lymphoma Father 7       stage 4   Bipolar disorder Sister    Thyroid cancer Maternal Aunt    Cancer Paternal Uncle        lymphatic   Diabetes Paternal Grandmother    Dementia Paternal Grandmother    Heart disease Paternal Grandfather    Migraines Daughter    Colon cancer Neg Hx    Colon polyps Neg Hx    Esophageal cancer Neg Hx    Rectal cancer Neg Hx    Stomach cancer Neg Hx     Past Surgical History:  Procedure Laterality Date   COLONOSCOPY  03/24/2010   Jule Ser); benign polyp, told to repeat 10 years   COLONOSCOPY  06/07/2021   one cecal polyp - tubular adenoma   EXAMINATION UNDER ANESTHESIA N/A 11/04/2014   Procedure: EXAM UNDER ANESTHESIA;  Surgeon: Leighton Ruff, MD;  Location: [facility];  Service: General;  Laterality: N/A;   hemorrhoid banding  2010   SPHINCTEROTOMY N/A 11/04/2014   Procedure: CHEMICAL SPHINCTEROTOMY WITH BOTOX/ fisurectomy;  Surgeon: Leighton Ruff, MD;  Location: [place];  Service: General;  Laterality: N/A;   TEAR DUCT PROBING  09/24/2004   left   TONSILLECTOMY  09/24/1984   Social History   Occupational History   Occupation: IT consultant   Occupation: works at [employer])  Tobacco Use   Smoking status: Never   Smokeless tobacco: Never  Scientific laboratory technician Use: Never used  Substance and Sexual Activity   Alcohol use: Yes    Comment: 0-5 glasses of wine/week   Drug use: No   Sexual activity: Yes    Partners: Male    Birth control/protection: Surgical    Comment: husband had vasectomy

## 2021-09-09 ENCOUNTER — Ambulatory Visit: Payer: Self-pay | Admitting: Surgery

## 2021-09-13 ENCOUNTER — Ambulatory Visit: Payer: Self-pay | Admitting: Surgery

## 2021-09-13 NOTE — Anesthesia Preprocedure Evaluation (Addendum)
Anesthesia Evaluation  Patient identified by MRN, date of birth, ID band Patient awake    Reviewed: Allergy & Precautions, NPO status , Patient's Chart, lab work & pertinent test results  Airway Mallampati: II  TM Distance: >3 FB Neck ROM: Full    Dental no notable dental hx. (+) Teeth Intact, Dental Advisory Given   Pulmonary neg pulmonary ROS,    Pulmonary exam normal breath sounds clear to auscultation       Cardiovascular Exercise Tolerance: Good Normal cardiovascular exam Rhythm:Regular Rate:Normal     Neuro/Psych negative neurological ROS  negative psych ROS   GI/Hepatic negative GI ROS,   Endo/Other  negative endocrine ROS  Renal/GU negative Renal ROS     Musculoskeletal negative musculoskeletal ROS (+)   Abdominal (+) - obese,   Peds  Hematology   Anesthesia Other Findings ASA   Reproductive/Obstetrics                           Anesthesia Physical Anesthesia Plan  ASA: 1  Anesthesia Plan: General   Post-op Pain Management: Tylenol PO (pre-op)   Induction: Intravenous  PONV Risk Score and Plan: 3 and Treatment may vary due to age or medical condition, Midazolam, Ondansetron and Dexamethasone  Airway Management Planned: Oral ETT  Additional Equipment: None  Intra-op Plan:   Post-operative Plan: Extubation in OR  Informed Consent:     Dental advisory given  Plan Discussed with:   Anesthesia Plan Comments:        Anesthesia Quick Evaluation

## 2021-09-14 ENCOUNTER — Ambulatory Visit (HOSPITAL_BASED_OUTPATIENT_CLINIC_OR_DEPARTMENT_OTHER)
Admission: RE | Admit: 2021-09-14 | Discharge: 2021-09-14 | Disposition: A | Discharge status: Home / Self Care | Payer: BC Managed Care – PPO | Attending: Surgery | Admitting: Surgery

## 2021-09-14 ENCOUNTER — Ambulatory Visit (HOSPITAL_BASED_OUTPATIENT_CLINIC_OR_DEPARTMENT_OTHER): Payer: BC Managed Care – PPO | Admitting: Anesthesiology

## 2021-09-14 ENCOUNTER — Encounter (HOSPITAL_BASED_OUTPATIENT_CLINIC_OR_DEPARTMENT_OTHER)
Admission: RE | Disposition: A | Discharge status: Home / Self Care | Payer: Self-pay | Source: Home / Self Care | Attending: Surgery

## 2021-09-14 ENCOUNTER — Encounter (HOSPITAL_BASED_OUTPATIENT_CLINIC_OR_DEPARTMENT_OTHER): Payer: Self-pay | Admitting: Surgery

## 2021-09-14 ENCOUNTER — Other Ambulatory Visit: Payer: Self-pay

## 2021-09-14 DIAGNOSIS — K603 Anal fistula: Secondary | ICD-10-CM | POA: Insufficient documentation

## 2021-09-14 DIAGNOSIS — K6289 Other specified diseases of anus and rectum: Secondary | ICD-10-CM | POA: Diagnosis not present

## 2021-09-14 DIAGNOSIS — D129 Benign neoplasm of anus and anal canal: Secondary | ICD-10-CM | POA: Insufficient documentation

## 2021-09-14 HISTORY — DX: Presence of spectacles and contact lenses: Z97.3

## 2021-09-14 HISTORY — PX: PLACEMENT OF SETON: SHX6029

## 2021-09-14 HISTORY — PX: FISTULOTOMY: SHX6413

## 2021-09-14 HISTORY — PX: RECTAL EXAM UNDER ANESTHESIA: SHX6399

## 2021-09-14 HISTORY — DX: Other chronic pain: G89.29

## 2021-09-14 SURGERY — EXAM UNDER ANESTHESIA, RECTUM
Anesthesia: General | Site: Rectum

## 2021-09-14 MED ORDER — 0.9 % SODIUM CHLORIDE (POUR BTL) OPTIME
TOPICAL | Status: DC | PRN
  Administered 2021-09-14: 11:00:00 500 mL

## 2021-09-14 MED ORDER — CHLORHEXIDINE GLUCONATE CLOTH 2 % EX PADS: 6.0000 | MEDICATED_PAD | Freq: Once | CUTANEOUS | Status: DC

## 2021-09-14 MED ORDER — ACETAMINOPHEN 500 MG PO TABS
ORAL_TABLET | ORAL | Status: AC
Start: 2021-09-14 — End: ?
  Filled 2021-09-14: qty 2

## 2021-09-14 MED ORDER — ONDANSETRON HCL 4 MG/2ML IJ SOLN
INTRAMUSCULAR | Status: DC | PRN
  Administered 2021-09-14: 4 mg via INTRAVENOUS

## 2021-09-14 MED ORDER — MIDAZOLAM HCL 5 MG/5ML IJ SOLN
INTRAMUSCULAR | Status: DC | PRN
  Administered 2021-09-14: 2 mg via INTRAVENOUS

## 2021-09-14 MED ORDER — ONDANSETRON HCL 4 MG/2ML IJ SOLN: 4.0000 mg | Freq: Once | INTRAMUSCULAR | Status: DC | PRN

## 2021-09-14 MED ORDER — OXYCODONE HCL 5 MG PO TABS: 5.0000 mg | ORAL_TABLET | Freq: Once | ORAL | Status: DC | PRN

## 2021-09-14 MED ORDER — HYDROMORPHONE HCL 1 MG/ML IJ SOLN: 0.2500 mg | INTRAMUSCULAR | Status: DC | PRN

## 2021-09-14 MED ORDER — LACTATED RINGERS IV SOLN: INTRAVENOUS | Status: DC

## 2021-09-14 MED ORDER — FLEET ENEMA 7-19 GM/118ML RE ENEM: 1.0000 | ENEMA | Freq: Once | RECTAL | Status: DC

## 2021-09-14 MED ORDER — ACETAMINOPHEN 500 MG PO TABS
1000.0000 mg | ORAL_TABLET | ORAL | Status: AC
  Administered 2021-09-14: 10:00:00 1000 mg via ORAL

## 2021-09-14 MED ORDER — DEXMEDETOMIDINE (PRECEDEX) IN NS 20 MCG/5ML (4 MCG/ML) IV SYRINGE
PREFILLED_SYRINGE | INTRAVENOUS | Status: DC | PRN
  Administered 2021-09-14: 16 ug via INTRAVENOUS

## 2021-09-14 MED ORDER — PROPOFOL 10 MG/ML IV BOLUS
INTRAVENOUS | Status: AC
  Filled 2021-09-14: qty 20

## 2021-09-14 MED ORDER — PROPOFOL 10 MG/ML IV BOLUS
INTRAVENOUS | Status: DC | PRN
  Administered 2021-09-14: 150 mg via INTRAVENOUS

## 2021-09-14 MED ORDER — OXYCODONE HCL 5 MG/5ML PO SOLN: 5.0000 mg | Freq: Once | ORAL | Status: DC | PRN

## 2021-09-14 MED ORDER — MIDAZOLAM HCL 2 MG/2ML IJ SOLN
INTRAMUSCULAR | Status: AC
  Filled 2021-09-14: qty 2

## 2021-09-14 MED ORDER — CEFAZOLIN SODIUM-DEXTROSE 2-4 GM/100ML-% IV SOLN
INTRAVENOUS | Status: AC
  Filled 2021-09-14: qty 100

## 2021-09-14 MED ORDER — TRAMADOL HCL 50 MG PO TABS: 50.0000 mg | ORAL_TABLET | Freq: Four times a day (QID) | ORAL | 0 refills | Status: AC | PRN

## 2021-09-14 MED ORDER — FENTANYL CITRATE (PF) 100 MCG/2ML IJ SOLN
INTRAMUSCULAR | Status: AC
  Filled 2021-09-14: qty 2

## 2021-09-14 MED ORDER — BUPIVACAINE-EPINEPHRINE 0.25% -1:200000 IJ SOLN
INTRAMUSCULAR | Status: DC | PRN
  Administered 2021-09-14: 24 mL

## 2021-09-14 MED ORDER — ACETAMINOPHEN 10 MG/ML IV SOLN: 1000.0000 mg | Freq: Once | INTRAVENOUS | Status: DC | PRN

## 2021-09-14 MED ORDER — ONDANSETRON HCL 4 MG/2ML IJ SOLN
INTRAMUSCULAR | Status: AC
  Filled 2021-09-14: qty 2

## 2021-09-14 MED ORDER — SODIUM CHLORIDE (PF) 0.9 % IJ SOLN
INTRAMUSCULAR | Status: DC | PRN
  Administered 2021-09-14: 3 mL

## 2021-09-14 MED ORDER — FENTANYL CITRATE (PF) 100 MCG/2ML IJ SOLN
INTRAMUSCULAR | Status: DC | PRN
  Administered 2021-09-14 (×2): 50 ug via INTRAVENOUS

## 2021-09-14 MED ORDER — LIDOCAINE 2% (20 MG/ML) 5 ML SYRINGE
INTRAMUSCULAR | Status: AC
  Filled 2021-09-14: qty 5

## 2021-09-14 MED ORDER — DEXAMETHASONE SODIUM PHOSPHATE 10 MG/ML IJ SOLN
INTRAMUSCULAR | Status: AC
  Filled 2021-09-14: qty 1

## 2021-09-14 MED ORDER — BUPIVACAINE LIPOSOME 1.3 % IJ SUSP: 20.0000 mL | Freq: Once | INTRAMUSCULAR | Status: DC

## 2021-09-14 MED ORDER — BUPIVACAINE LIPOSOME 1.3 % IJ SUSP
INTRAMUSCULAR | Status: DC | PRN
  Administered 2021-09-14: 16 mL

## 2021-09-14 MED ORDER — DEXAMETHASONE SODIUM PHOSPHATE 4 MG/ML IJ SOLN
INTRAMUSCULAR | Status: DC | PRN
  Administered 2021-09-14: 10 mg via INTRAVENOUS

## 2021-09-14 MED ORDER — LIDOCAINE 2% (20 MG/ML) 5 ML SYRINGE
INTRAMUSCULAR | Status: DC | PRN
  Administered 2021-09-14: 100 mg via INTRAVENOUS

## 2021-09-14 MED ORDER — CEFAZOLIN SODIUM-DEXTROSE 2-4 GM/100ML-% IV SOLN
2.0000 g | INTRAVENOUS | Status: AC
  Administered 2021-09-14: 10:00:00 2 g via INTRAVENOUS

## 2021-09-14 MED ORDER — AMISULPRIDE (ANTIEMETIC) 5 MG/2ML IV SOLN: 10.0000 mg | Freq: Once | INTRAVENOUS | Status: DC | PRN

## 2021-09-14 SURGICAL SUPPLY — 65 items
APL SKNCLS STERI-STRIP NONHPOA (GAUZE/BANDAGES/DRESSINGS) ×2
BENZOIN TINCTURE PRP APPL 2/3 (GAUZE/BANDAGES/DRESSINGS) ×6 IMPLANT
BLADE EXTENDED COATED 6.5IN (ELECTRODE) IMPLANT
BLADE SURG 10 STRL SS (BLADE) IMPLANT
BLADE SURG 15 STRL LF DISP TIS (BLADE) ×1 IMPLANT
BLADE SURG 15 STRL SS (BLADE) ×3
COVER BACK TABLE 60X90IN (DRAPES) ×3 IMPLANT
COVER MAYO STAND STRL (DRAPES) ×3 IMPLANT
DECANTER SPIKE VIAL GLASS SM (MISCELLANEOUS) ×3 IMPLANT
DRAPE HYSTEROSCOPY (MISCELLANEOUS) IMPLANT
DRAPE LAPAROTOMY 100X72 PEDS (DRAPES) ×3 IMPLANT
DRAPE SHEET LG 3/4 BI-LAMINATE (DRAPES) IMPLANT
DRAPE UTILITY XL STRL (DRAPES) ×3 IMPLANT
DRSG PAD ABDOMINAL 8X10 ST (GAUZE/BANDAGES/DRESSINGS) ×3 IMPLANT
GAUZE 4X4 16PLY ~~LOC~~+RFID DBL (SPONGE) ×3 IMPLANT
GAUZE SPONGE 4X4 12PLY STRL (GAUZE/BANDAGES/DRESSINGS) ×3 IMPLANT
GLOVE SRG 8 PF TXTR STRL LF DI (GLOVE) ×1 IMPLANT
GLOVE SURG ENC MOIS LTX SZ7.5 (GLOVE) ×3 IMPLANT
GLOVE SURG UNDER POLY LF SZ8 (GLOVE) ×3
GOWN STRL REUS W/TWL LRG LVL3 (GOWN DISPOSABLE) ×3 IMPLANT
HYDROGEN PEROXIDE 16OZ (MISCELLANEOUS) IMPLANT
IV CATH 14GX2 1/4 (CATHETERS) IMPLANT
IV CATH 18G SAFETY (IV SOLUTION) IMPLANT
KIT SIGMOIDOSCOPE (SET/KITS/TRAYS/PACK) IMPLANT
KIT TURNOVER CYSTO (KITS) ×3 IMPLANT
LEGGING LITHOTOMY PAIR STRL (DRAPES) IMPLANT
LOOP VESSEL MAXI BLUE (MISCELLANEOUS) IMPLANT
NDL HYPO 25X1 1.5 SAFETY (NEEDLE) IMPLANT
NDL SAFETY ECLIPSE 18X1.5 (NEEDLE) IMPLANT
NEEDLE HYPO 18GX1.5 SHARP (NEEDLE)
NEEDLE HYPO 22GX1.5 SAFETY (NEEDLE) ×3 IMPLANT
NEEDLE HYPO 25X1 1.5 SAFETY (NEEDLE) IMPLANT
NS IRRIG 500ML POUR BTL (IV SOLUTION) ×3 IMPLANT
PACK BASIN DAY SURGERY FS (CUSTOM PROCEDURE TRAY) ×3 IMPLANT
PANTS MESH DISP LRG (UNDERPADS AND DIAPERS) ×1 IMPLANT
PANTS MESH DISPOSABLE L (UNDERPADS AND DIAPERS) ×2
PENCIL SMOKE EVACUATOR (MISCELLANEOUS) ×3 IMPLANT
SPONGE HEMORRHOID 8X3CM (HEMOSTASIS) IMPLANT
SPONGE SURGIFOAM ABS GEL 12-7 (HEMOSTASIS) IMPLANT
SUCTION FRAZIER HANDLE 10FR (MISCELLANEOUS)
SUCTION TUBE FRAZIER 10FR DISP (MISCELLANEOUS) IMPLANT
SUT CHROMIC 2 0 SH (SUTURE) IMPLANT
SUT CHROMIC 3 0 SH 27 (SUTURE) IMPLANT
SUT MNCRL AB 4-0 PS2 18 (SUTURE) IMPLANT
SUT SILK 0 PSL NDL (SUTURE) IMPLANT
SUT SILK 0 TIES 10X30 (SUTURE) IMPLANT
SUT SILK 2 0 (SUTURE)
SUT SILK 2-0 18XBRD TIE 12 (SUTURE) IMPLANT
SUT VIC AB 2-0 SH 27 (SUTURE)
SUT VIC AB 2-0 SH 27XBRD (SUTURE) IMPLANT
SUT VIC AB 3-0 SH 18 (SUTURE) IMPLANT
SUT VIC AB 3-0 SH 27 (SUTURE)
SUT VIC AB 3-0 SH 27X BRD (SUTURE) IMPLANT
SUT VIC AB 3-0 SH 27XBRD (SUTURE) IMPLANT
SUT VIC AB 4-0 P-3 18XBRD (SUTURE) IMPLANT
SUT VIC AB 4-0 P3 18 (SUTURE)
SYR 20ML LL LF (SYRINGE) IMPLANT
SYR BULB IRRIG 60ML STRL (SYRINGE) ×3 IMPLANT
SYR CONTROL 10ML LL (SYRINGE) ×3 IMPLANT
SYR TB 1ML LL NO SAFETY (SYRINGE) IMPLANT
TOWEL OR 17X26 10 PK STRL BLUE (TOWEL DISPOSABLE) ×3 IMPLANT
TRAY DSU PREP LF (CUSTOM PROCEDURE TRAY) ×3 IMPLANT
TUBE CONNECTING 12'X1/4 (SUCTIONS) ×1
TUBE CONNECTING 12X1/4 (SUCTIONS) ×2 IMPLANT
YANKAUER SUCT BULB TIP NO VENT (SUCTIONS) ×3 IMPLANT

## 2021-09-14 NOTE — Transfer of Care (Signed)
Immediate Anesthesia Transfer of Care Note  Patient: Barbara Oconnor  Procedure(s) Performed: ANORECTAL EXAM UNDER ANESTHESIA (Rectum) POSSIBLE FISTULOTOMY (Rectum) POSSIBLE PLACEMENT OF SETON (Rectum)  Patient Location: PACU  Anesthesia Type:General  Level of Consciousness: awake  Airway & Oxygen Therapy: Patient Spontanous Breathing and Patient connected to face mask oxygen  Post-op Assessment: Report given to RN and Post -op Vital signs reviewed and stable  Post vital signs: Reviewed and stable  Last Vitals:  Vitals Value Taken Time  BP 140/72 09/14/21 1042  Temp    Pulse 73 09/14/21 1044  Resp 9 09/14/21 1044  SpO2 98 % 09/14/21 1044  Vitals shown include unvalidated device data.  Last Pain:  Vitals:   09/14/21 0925  TempSrc: Oral  PainSc: 0-No pain      Patients Stated Pain Goal: 2 (89/02/28 4069)  Complications: No notable events documented.

## 2021-09-14 NOTE — Anesthesia Postprocedure Evaluation (Signed)
Anesthesia Post Note  Patient: Barbara Oconnor  Procedure(s) Performed: ANORECTAL EXAM UNDER ANESTHESIA (Rectum) POSSIBLE FISTULOTOMY (Rectum) POSSIBLE PLACEMENT OF SETON (Rectum)     Patient location during evaluation: PACU Anesthesia Type: General Level of consciousness: awake and alert Pain management: pain level controlled Vital Signs Assessment: post-procedure vital signs reviewed and stable Respiratory status: spontaneous breathing, nonlabored ventilation, respiratory function stable and patient connected to nasal cannula oxygen Cardiovascular status: blood pressure returned to baseline and stable Postop Assessment: no apparent nausea or vomiting Anesthetic complications: no   No notable events documented.  Last Vitals:  Vitals:   09/14/21 1129 09/14/21 1148  BP: 136/82 132/84  Pulse: 70 77  Resp: 15 14  Temp:  36.7 C  SpO2: 96% 97%    Last Pain:  Vitals:   09/14/21 1148  TempSrc:   PainSc: 0-No pain                 Barnet Glasgow

## 2021-09-14 NOTE — Discharge Instructions (Addendum)
ANORECTAL SURGERY: POST OP INSTRUCTIONS  You had a short-tract, shallow anal fistula. This was amenable to a fistulotomy. This is where the wound is opened. A small associated 'tag' was removed as well  DIET: Follow a light bland diet the first 24 hours after arrival home, such as soup, liquids, crackers, etc.  Be sure to include lots of fluids daily.  Avoid fast food or heavy meals as your are more likely to get nauseated.  Eat a low fat diet the next few days after surgery.   Some bleeding with bowel movements is expected for the first couple of days but this should stop in between bowel movements  Take your usually prescribed home medications unless otherwise directed.  PAIN CONTROL: It is helpful to take an over-the-counter pain medication regularly for the first few days/weeks.  Choose from the following that works best for you: Ibuprofen (Advil, etc) Three 200mg  tabs every 6 hours as needed. Acetaminophen (Tylenol, etc) 500-650mg  every 6 hours as needed NOTE: You may take both of these medications together - most patients find it most helpful when alternating between the two (i.e. Ibuprofen at 6am, tylenol at 9am, ibuprofen at 12pm ..Marland Kitchen) A  prescription for pain medication may have been prescribed for you at discharge.  Take your pain medication as prescribed.  If you are having problems/concerns with the prescription medicine, please call us for further advice.  Avoid getting constipated.  Between the surgery and the pain medications, it is common to experience some constipation.  Increasing fluid intake (64oz of water per day) and taking a fiber supplement (such as Metamucil, Citrucel, FiberCon) 1-2 times a day regularly will usually help prevent this problem from occurring.  Take Miralax (over the counter) 1-2x/day while taking a narcotic pain medication. If no bowel movement after 48hours, you may additionally take a laxative like a bottle of Milk of Magnesia which can be purchased over the  counter. Avoid enemas if possible as these are often painful.   Watch out for diarrhea.  If you have many loose bowel movements, simplify your diet to bland foods.  Stop any stool softeners and decrease your fiber supplement. If this worsens or does not improve, please call us.  Wash / shower every day.  If you were discharged with a dressing, you may remove this the day after your surgery. You may shower normally, getting soap/water on your wound, particularly after bowel movements.  Soaking in a warm bath filled a couple inches ("Sitz bath") is a great way to clean the area after a bowel movement and many patients find it is a way to soothe the area.  ACTIVITIES as tolerated:   You may resume regular (light) daily activities beginning the next day--such as daily self-care, walking, climbing stairs--gradually increasing activities as tolerated.  If you can walk 30 minutes without difficulty, it is safe to try more intense activity such as jogging, treadmill, bicycling, low-impact aerobics, etc. Refrain from any heavy lifting or straining for the first 2 weeks after your procedure, particularly if your surgery was for hemorrhoids. Avoid activities that make your pain worse You may drive when you are no longer taking prescription pain medication, you can comfortably wear a seatbelt, and you can safely maneuver your car and apply brakes.  FOLLOW UP in our office Please call [facility] at [phone] to set up an appointment to see your surgeon in the office for a follow-up appointment approximately 2 weeks after your surgery. Make sure that you call  for this appointment the day you arrive home to insure a convenient appointment time.  9. If you have disability or family leave forms that need to be completed, you may have them completed by your primary care physician's office; for return to work instructions, please ask our office staff and they will be happy to assist you in obtaining this  documentation   When to call us [phone]: Poor pain control Reactions / problems with new medications (rash/itching, etc)  Fever over 101.5 F (38.5 C) Inability to urinate Nausea/vomiting Worsening swelling or bruising Continued bleeding from incision. Increased pain, redness, or drainage from the incision  The clinic staff is available to answer your questions during regular business hours (8:30am-5pm).  Please dont hesitate to call and ask to speak to one of our nurses for clinical concerns.   A surgeon from [facility] is always on call at the hospitals   If you have a medical emergency, go to the nearest emergency room or call 911.   [facility] [address], [place], [place], [place]  [address] MAIN: [phone] FAX: [phone] [email or web address]  Next dose of Tylenol/acetaminophen after 3:30 pm today if needed.    Post Anesthesia Home Care Instructions  Activity: Get plenty of rest for the remainder of the day. A responsible individual must stay with you for 24 hours following the procedure.  For the next 24 hours, DO NOT: -Drive a car -Paediatric nurse -Drink alcoholic beverages -Take any medication unless instructed by your physician -Make any legal decisions or sign important papers.  Meals: Start with liquid foods such as gelatin or soup. Progress to regular foods as tolerated. Avoid greasy, spicy, heavy foods. If nausea and/or vomiting occur, drink only clear liquids until the nausea and/or vomiting subsides. Call your physician if vomiting continues.  Special Instructions/Symptoms: Your throat may feel dry or sore from the anesthesia or the breathing tube placed in your throat during surgery. If this causes discomfort, gargle with warm salt water. The discomfort should disappear within 24 hours.     Information for Discharge Teaching: EXPAREL (bupivacaine liposome injectable  suspension)   Your surgeon or anesthesiologist gave you EXPAREL(bupivacaine) to help control your pain after surgery.  EXPAREL is a local anesthetic that provides pain relief by numbing the tissue around the surgical site. EXPAREL is designed to release pain medication over time and can control pain for up to 72 hours. Depending on how you respond to EXPAREL, you may require less pain medication during your recovery.  Possible side effects: Temporary loss of sensation or ability to move in the area where bupivacaine was injected. Nausea, vomiting, constipation Rarely, numbness and tingling in your mouth or lips, lightheadedness, or anxiety may occur. Call your doctor right away if you think you may be experiencing any of these sensations, or if you have other questions regarding possible side effects.  Follow all other discharge instructions given to you by your surgeon or nurse. Eat a healthy diet and drink plenty of water or other fluids.  If you return to the hospital for any reason within 96 hours following the administration of EXPAREL, it is important for health care providers to know that you have received this anesthetic. A teal colored band has been placed on your arm with the date, time and amount of EXPAREL you have received in order to alert and inform your health care providers. Please leave this armband in place for the full  96 hours following administration, and then you may remove the band. (May remove Monday, 12/26)

## 2021-09-14 NOTE — H&P (Signed)
CC: Here today for surgery  HPI: Barbara Oconnor is an 55 y.o. female with history of thyroid nodules, bright red blood per rectum, prior presumed anal fissure whom is seen in the office today as a referral by Dr. Tarri Glenn for evaluation of possible fissure.   Colonoscopy 06/07/2021 demonstrated a persistent posterior anal fissure on perianal exam. 3 mm polyp in the cecum was removed.  She has a multiyear history now dating back for at least 10 or 11 years of bright red blood per rectum with bowel movements. She takes MiraLAX daily. She reports her stools are soft and easy to pass. She denies any pain with defecation. She does not report spreading the gluteal cheeks apart when sitting on the commode.  Of note, she does have a history with one of my partners of having had an rectal surgery-she was taken to the operating room 11/04/2014 with Dr. Marcello Moores and underwent anorectal exam under anesthesia for a chronic anal fissure that had failed medical therapy. She was found to have a tear more distal than normal. She had 40 units of Botox injected into the intersphincteric groove. She also had a fissurectomy type procedure performed with the edges were trimmed and 3 interrupted 3-0 chromic sutures were placed. She reported no significant improvement in her symptoms following this. She was seen back with GI and ultimately referred to see me.  Currently, she continues to deny any anorectal pain. Her symptoms are that of bright red blood per rectum on the toilet paper and in the toilet bowl. She will have some bleeding in between bowel movements and will therefore often wear a pad. She denies any issues with incontinence to gas, liquid, or solid stool. She reports good control.  PMH: thyroid nodules, bright red blood per rectum, prior presumed anal fissure  PSH: Perianal botox, fissurectomy 10/2014 Dr. Marcello Moores  FHx: She reports her mother had breast cancer. Father had high blood pressure. Denies any other  known family history of colorectal, breast, endometrial or ovarian cancer  Social Hx: Denies use of tobacco/EtOH/illicit drug.   Past Medical History:  Diagnosis Date   Allergy    Anal fissure    persistent anal fissure   Chronic back pain    Complication of anesthesia    slow awakening many years ago per pt on 09/08/2021   Goiter 2002   u/s 2002, 2010 (Dr. Buddy Duty)   H/O motion sickness    History of chemical exposure 03/07/2020   Pt was exposed to some sort of strong chemical that was used at her workplace to disinfect the bathrooms and other areas. After exposure, she experienced SOB, chest tightness, & cough.   Knee pain, bilateral 10/12/2020   Low back pain 10/2010   Multiple thyroid nodules    Rectal bleeding    anal fissure   Wears contact lenses    Wears glasses     Past Surgical History:  Procedure Laterality Date   COLONOSCOPY  03/24/2010   Jule Ser); benign polyp, told to repeat 10 years   COLONOSCOPY  06/07/2021   one cecal polyp - tubular adenoma   EXAMINATION UNDER ANESTHESIA N/A 11/04/2014   Procedure: EXAM UNDER ANESTHESIA;  Surgeon: Leighton Ruff, MD;  Location: [facility];  Service: General;  Laterality: N/A;   hemorrhoid banding  2010   SPHINCTEROTOMY N/A 11/04/2014   Procedure: CHEMICAL SPHINCTEROTOMY WITH BOTOX/ fisurectomy;  Surgeon: Leighton Ruff, MD;  Location: [facility];  Service: General;  Laterality: N/A;  TEAR DUCT PROBING  09/24/2004   left   TONSILLECTOMY  09/24/1984    Family History  Problem Relation Age of Onset   Breast cancer Mother 43   Hyperlipidemia Father    Hypertension Father    Non-Hodgkin's lymphoma Father 11       stage 4   Bipolar disorder Sister    Thyroid cancer Maternal Aunt    Cancer Paternal Uncle        lymphatic   Diabetes Paternal Grandmother    Dementia Paternal Grandmother    Heart disease Paternal Grandfather    Migraines Daughter    Colon cancer Neg Hx    Colon polyps  Neg Hx    Esophageal cancer Neg Hx    Rectal cancer Neg Hx    Stomach cancer Neg Hx     Social:  reports that she has never smoked. She has never used smokeless tobacco. She reports current alcohol use. She reports that she does not use drugs.  Allergies:  Allergies  Allergen Reactions   Aspirin    Ibuprofen Other (See Comments)   Wound Dressing Adhesive Rash    Medications: I have reviewed the patient's current medications.  No results found for this or any previous visit (from the past 48 hour(s)).  No results found.  ROS - all of the below systems have been reviewed with the patient and positives are indicated with bold text General: chills, fever or night sweats Eyes: blurry vision or double vision ENT: epistaxis or sore throat Allergy/Immunology: itchy/watery eyes or nasal congestion Hematologic/Lymphatic: bleeding problems, blood clots or swollen lymph nodes Endocrine: temperature intolerance or unexpected weight changes Breast: new or changing breast lumps or nipple discharge Resp: cough, shortness of breath, or wheezing CV: chest pain or dyspnea on exertion GI: as per HPI GU: dysuria, trouble voiding, or hematuria MSK: joint pain or joint stiffness Neuro: TIA or stroke symptoms Derm: pruritus and skin lesion changes Psych: anxiety and depression  PE Weight 83 kg, last menstrual period 02/04/2014. Constitutional: NAD; conversant Eyes: Moist conjunctiva; no lid lag; anicteric Lungs: Normal respiratory effort CV: RRR MSK: Normal range of motion of extremities Psychiatric: Appropriate affect; alert and oriented x3  No results found for this or any previous visit (from the past 48 hour(s)).  No results found.  A/P: Barbara Oconnor is an 55 y.o. female with hx of thyroid nodules, bright red blood per rectum, prior presumed anal fissure here for evaluation of bleeding perianal wound.  -With her history of fissures, and exam findings today, as well as the  location of this being at the anal os, this looks more suspicious in appearance for a fistula which could have developed in the setting of a prior fissure, as opposed to a chronic anal fissure. Additionally, her symptoms are only that of bleeding from this granulation tissue. She does not have any history in her recent past of pain with bowel movements.  -The anatomy and physiology of the anal canal was discussed with the patient with associated pictures. The pathophysiology of anal wounds and fistula was discussed at length with associated pictures and illustrations. -We have reviewed options going forward including further observation vs surgery -anorectal exam under anesthesia with interrogation of this anal wound. Probable anal fistulotomy, based upon intraoperative findings. -The planned procedure, material risks, benefits and alternatives to surgery were discussed at length. The patient's questions were answered to her satisfaction, she voiced understanding and has elected to not proceed with surgery. Additionally, we discussed  typical postoperative expectations and the recovery process. -She is welcome to return for follow-up as needed moving forward as well  Nadeen Landau, MD [facility], [place]

## 2021-09-14 NOTE — Op Note (Signed)
09/14/2021  10:48 AM  PATIENT:  Barbara Oconnor  55 y.o. female  Patient Care Team: Rita Ohara, MD as PCP - General (Family Medicine)  PRE-OPERATIVE DIAGNOSIS:   History of chronic anal fissure Perianal wound, possible anal fistula  POST-OPERATIVE DIAGNOSIS:   Subcutaneous anal fistula Hypertrophic anal papilla  PROCEDURE:   Anal fistulotomy Excision of hypertrophic anal papilla Anorectal exam under anesthesia  SURGEON:  Surgeon(s): Ileana Roup, MD  ASSISTANT: OR staff   ANESTHESIA:   local and general  SPECIMEN:  Hypertrophic anal papilla  DISPOSITION OF SPECIMEN:  PATHOLOGY  COUNTS:  Sponge, needle, and instrument counts were reported correct x2 at conclusion.  EBL: 1 mL  PLAN OF CARE: Discharge to home after PACU  PATIENT DISPOSITION:  PACU - hemodynamically stable.  OR FINDINGS: Punctate opening in the posterior midline.  Associated scar consistent with prior anal fissure/fissurectomy.  Hypertrophic anal papilla.  Tract was interrogated and found to communicate with the anal canal just distal to the dentate.  No significant overlying sphincter muscle.  This was therefore amenable to a fistulotomy.  Hypertrophic anal papilla excised.  DESCRIPTION: The patient was identified in the preoperative holding area and taken to the OR where she was placed on the operating room table. SCDs were placed.  General anesthesia was induced without difficulty. The patient was then positioned in high lithotomy with Allen stirrups. Pressure points were then evaluated and padded.  She was then prepped and draped in usual sterile fashion.  A surgical timeout was performed indicating the correct patient, procedure, and positioning.  A perianal block was performed using a dilute mixture of 0.25% Marcaine with epinephrine and Exparel.   After ascertaining that an appropriate level of anesthesia had been achieved, a well lubricated digital rectal exam was performed. This  demonstrated no palpable masses or abnormalities.  A Hill-Ferguson anoscope was into the anal canal and circumferential inspection demonstrated posterior midline anal fistula-external opening at the anal os.  No active anal fissure.  Associated hypertrophic anal papilla.  The external opening was then carefully probed so as not to create false passages and found to communicate with a punctate opening just distal to the dentate line in the posterior midline.  The tract is palpated and noted to contain no significant mount of sphincter muscle.  This represents a shallow/subcutaneous anal fistula.  This is amenable to fistulotomy.  The overlying anoderm is incised.  Of note, at this location, there is scarring of the anoderm consistent with a prior fissure/fissurectomy.  Granulation tissue was then fulgurated within the tract.  The associated hypertrophic anal papilla is elevated with a DeBakey forcep and excised electrocautery, passed off as specimen.  The anal canal was irrigated and hemostasis verified.  Additional local anesthetic is infiltrated on the field.  All counts were reported correct.  She is taken lithotomy, awakened from anesthesia, extubated, and transferred to a stretcher for transport to PACU in satisfactory condition.  DISPOSITION: PACU in satisfactory condition.

## 2021-09-14 NOTE — Anesthesia Procedure Notes (Signed)
Procedure Name: LMA Insertion Date/Time: 09/14/2021 10:11 AM Performed by: Lieutenant Diego, CRNA Pre-anesthesia Checklist: Patient identified, Emergency Drugs available, Suction available and Patient being monitored Patient Re-evaluated:Patient Re-evaluated prior to induction Oxygen Delivery Method: Circle system utilized Preoxygenation: Pre-oxygenation with 100% oxygen Induction Type: IV induction Ventilation: Mask ventilation without difficulty LMA: LMA inserted LMA Size: 4.0 Number of attempts: 1 Placement Confirmation: positive ETCO2 and breath sounds checked- equal and bilateral Tube secured with: Tape Dental Injury: Teeth and Oropharynx as per pre-operative assessment

## 2021-09-15 LAB — SURGICAL PATHOLOGY

## 2021-09-19 ENCOUNTER — Encounter (HOSPITAL_BASED_OUTPATIENT_CLINIC_OR_DEPARTMENT_OTHER): Payer: Self-pay | Admitting: Surgery

## 2021-09-20 ENCOUNTER — Ambulatory Visit (HOSPITAL_COMMUNITY): Admission: RE | Admit: 2021-09-20 | Payer: BC Managed Care – PPO | Source: Ambulatory Visit

## 2021-09-22 ENCOUNTER — Ambulatory Visit (HOSPITAL_COMMUNITY)
Admission: RE | Admit: 2021-09-22 | Discharge: 2021-09-22 | Disposition: A | Discharge status: Home / Self Care | Payer: BC Managed Care – PPO | Source: Ambulatory Visit | Attending: Orthopedic Surgery | Admitting: Orthopedic Surgery

## 2021-09-22 ENCOUNTER — Other Ambulatory Visit: Payer: Self-pay

## 2021-09-22 DIAGNOSIS — M189 Osteoarthritis of first carpometacarpal joint, unspecified: Secondary | ICD-10-CM | POA: Diagnosis not present

## 2021-09-22 DIAGNOSIS — R200 Anesthesia of skin: Secondary | ICD-10-CM | POA: Diagnosis not present

## 2021-09-22 DIAGNOSIS — R202 Paresthesia of skin: Secondary | ICD-10-CM | POA: Insufficient documentation

## 2021-09-22 IMAGING — MR MR [PERSON_NAME]*[PERSON_NAME]* WO/W CM
10 series · 40 of 40 positions shown · IV contrast (gadavist)
Comparison: None.

CLINICAL DATA: No known injury. Numbness of the left thumb.

EXAM:
MRI OF THE LEFT HAND WITHOUT AND WITH CONTRAST
TECHNIQUE: Multiplanar, multisequence MR imaging of the left thumb was
performed before and after the administration of intravenous
contrast.
CONTRAST:  8mL GADAVIST GADOBUTROL 1 MMOL/ML IV SOLN

[Series 4: T1 · axial · left · 4.0mm · 0.55mm/px · z∈[-17,+165]mm · 6 of 46 slices shown (1 of 2)]
[im 1/46]
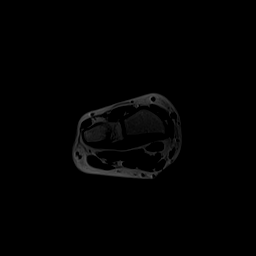
[im 10/46]
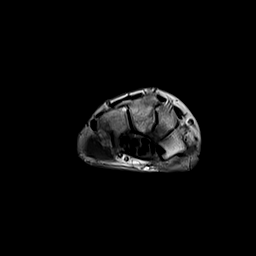
[im 19/46]
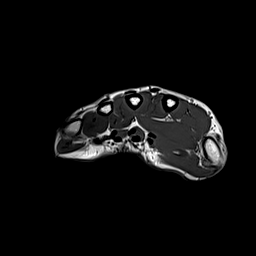
[im 28/46]
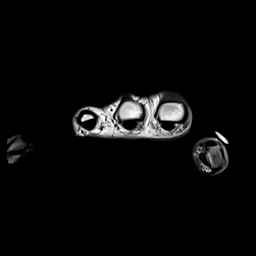
[im 37/46]
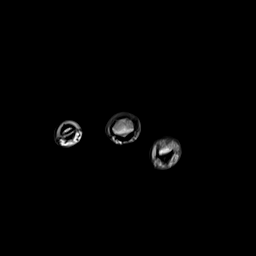
[im 46/46]
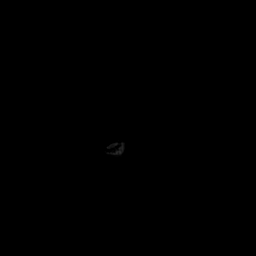

[Series 5: T2 fat-sat · axial · left · 4.0mm · 0.55mm/px · z∈[-19,+164]mm · 5 of 46 slices shown (1 of 2)]
[im 1/46]
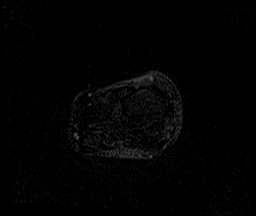
[im 12/46]
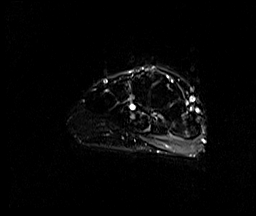
[im 23/46]
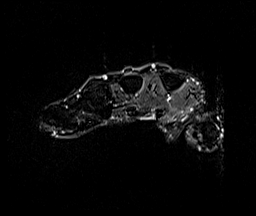
[im 34/46]
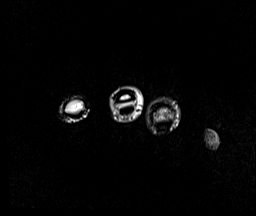
[im 46/46]
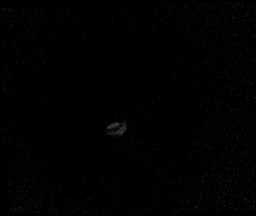

[Series 6: T1 · coronal · left · 3.0mm · 0.78mm/px · 2 of 20 slices shown (2 of 2)]
[im 1/20]
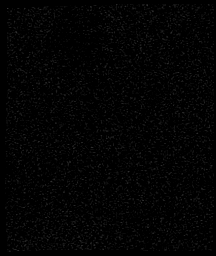
[im 20/20]
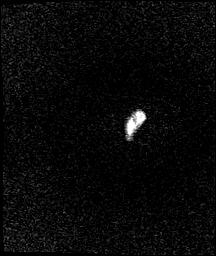

[Series 7: STIR · coronal · left · 3.0mm · 0.62mm/px · 2 of 21 slices shown]
[im 1/21]
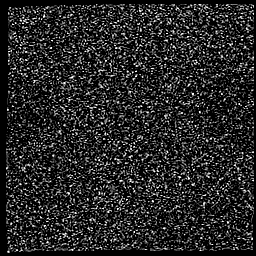
[im 21/21]
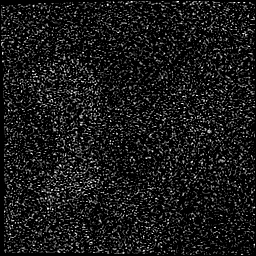

[Series 8: PD fat-sat · sagittal · left · 3.0mm · 0.30mm/px · 5 of 41 slices shown]
[im 1/41]
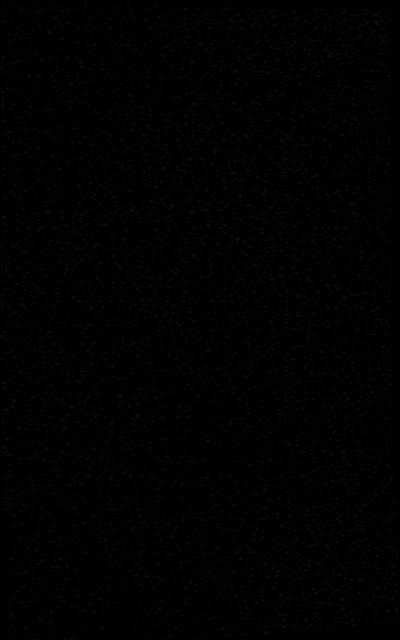
[im 11/41]
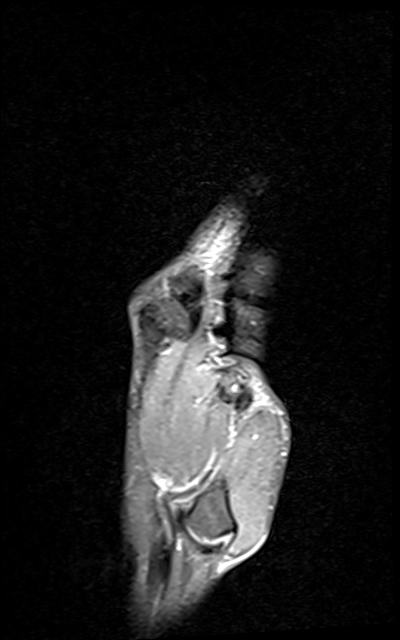
[im 21/41]
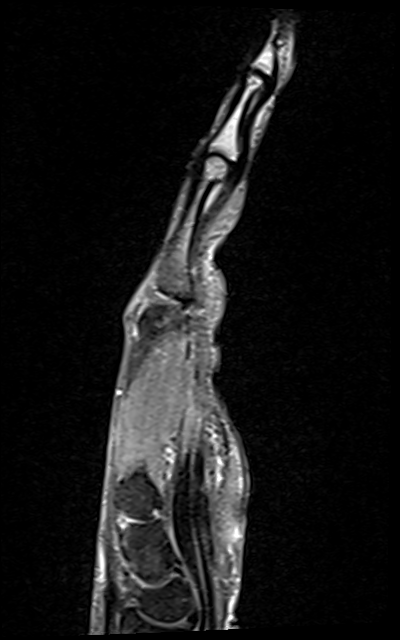
[im 31/41]
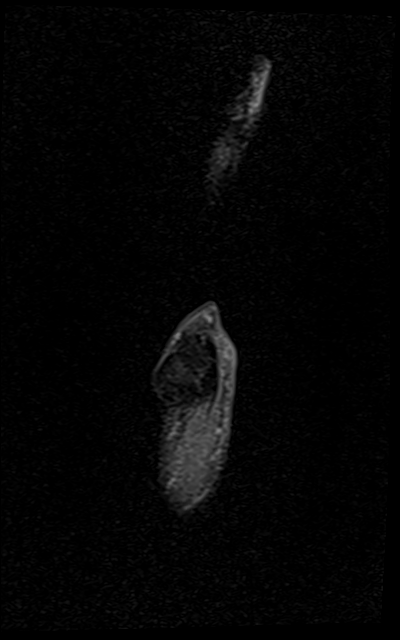
[im 41/41]
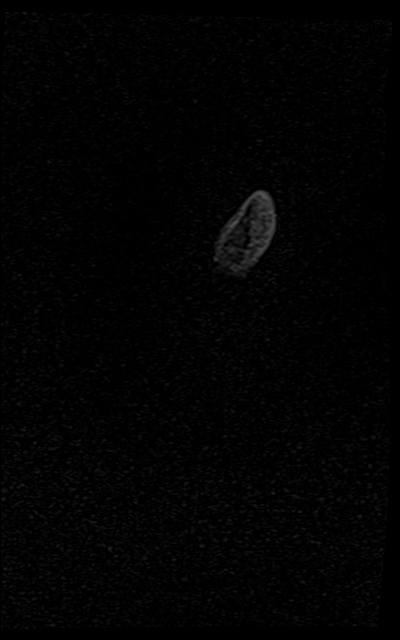

[Series 9: T1 fat-sat · axial · non-contrast · left · 4.0mm · 0.55mm/px · z∈[-17,+165]mm · 5 of 46 slices shown]
[im 1/46]
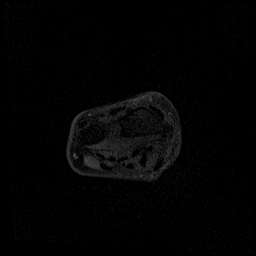
[im 12/46]
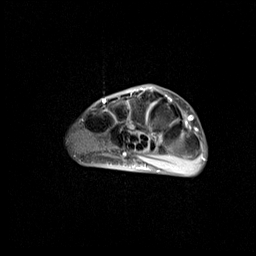
[im 23/46]
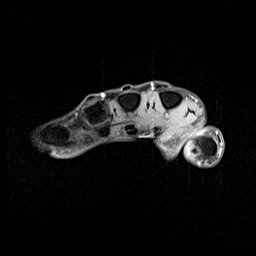
[im 34/46]
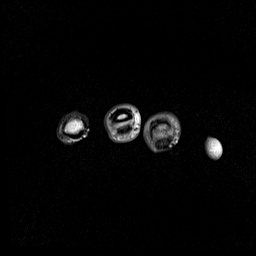
[im 46/46]
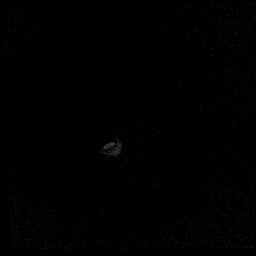

[Series 10: T2 fat-sat · axial · left · 4.0mm · 0.55mm/px · z∈[+2,+144]mm · 4 of 36 slices shown (2 of 2)]
[im 1/36]
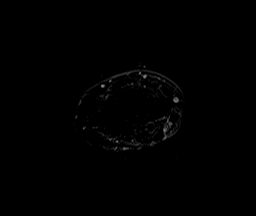
[im 12/36]
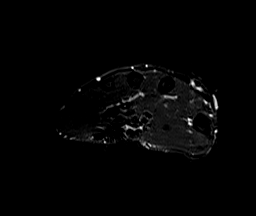
[im 24/36]
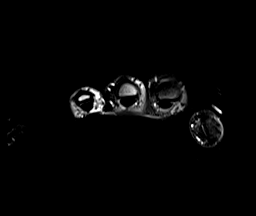
[im 36/36]
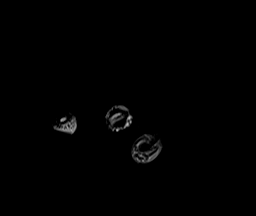

[Series 11: T1 fat-sat post-contrast · axial · left · 4.0mm · 0.47mm/px · z∈[-23,+160]mm · 5 of 46 slices shown (1 of 3)]
[im 1/46]
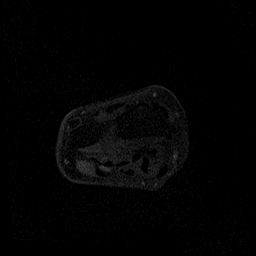
[im 12/46]
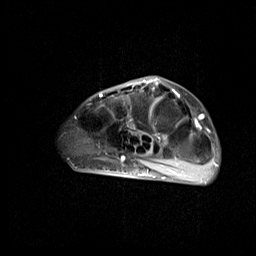
[im 23/46]
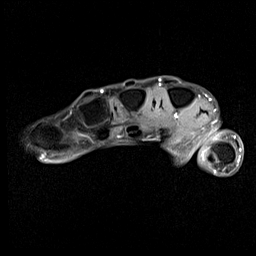
[im 34/46]
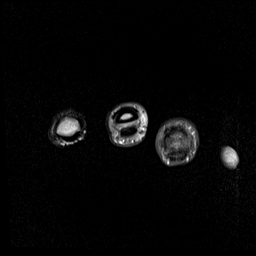
[im 46/46]
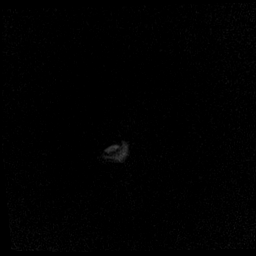

[Series 12: T1 fat-sat post-contrast · coronal · left · 3.0mm · 0.78mm/px · 2 of 19 slices shown (2 of 3)]
[im 1/19]
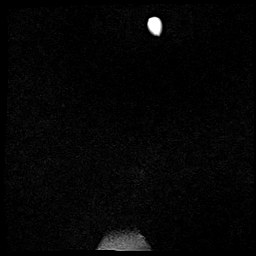
[im 19/19]
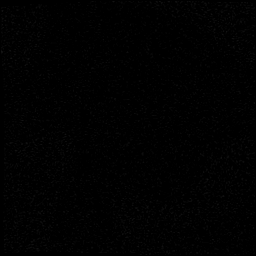

[Series 13: T1 fat-sat post-contrast · sagittal · left · 3.0mm · 0.70mm/px · 4 of 39 slices shown (3 of 3)]
[im 1/39]
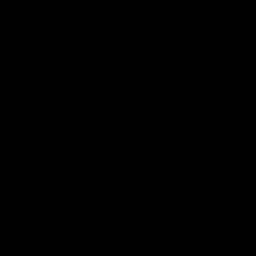
[im 13/39]
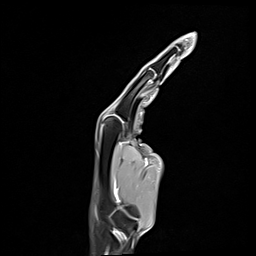
[im 26/39]
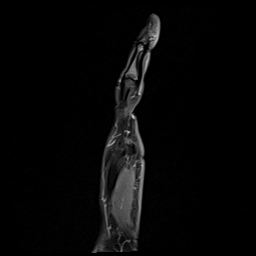
[im 39/39]
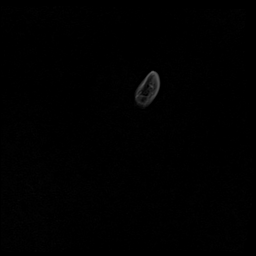

[40 of 40 positions shown; findings below may reference images not displayed]

FINDINGS: Bones/Joint/Cartilage

No marrow signal abnormality. No fracture or dislocation. Normal
alignment. No joint effusion. No erosive changes. No periostitis.
Mild osteoarthritis of the first CMC joint and first MCP joint. Mild
osteoarthritis of the first IP joint. Small perforation of the body
of the TFCC.

Ligaments

Collateral ligaments are intact.

Muscles and Tendons
Flexor and extensor compartment tendons are intact. Muscles are
normal. No muscle edema or muscle atrophy.

Soft tissue
No fluid collection or hematoma. No soft tissue mass. Visualized
portion of the median nerve is normal without focal abnormality or
enhancement.
IMPRESSION: 1. No acute abnormality of the left thumb. No MRI findings to
explain the patient's left thumb numbness.
2. Mild osteoarthritis of the first CMC joint and first MCP joint.
Mild osteoarthritis of the first IP joint.

## 2021-09-22 MED ORDER — GADOBUTROL 1 MMOL/ML IV SOLN
8.0000 mL | Freq: Once | INTRAVENOUS | Status: AC | PRN
  Administered 2021-09-22: 13:00:00 8 mL via INTRAVENOUS

## 2021-09-26 ENCOUNTER — Telehealth: Payer: Self-pay | Admitting: Orthopedic Surgery

## 2021-09-26 NOTE — Telephone Encounter (Signed)
Pt calling about her mri results.  Cb [phone]

## 2021-09-28 ENCOUNTER — Encounter: Payer: Self-pay | Admitting: Orthopaedic Surgery

## 2021-09-28 NOTE — Telephone Encounter (Signed)
Noted.  Will submit.  

## 2021-09-29 ENCOUNTER — Telehealth: Payer: Self-pay

## 2021-09-29 NOTE — Telephone Encounter (Signed)
VOB submitted for SynviscOne, right knee. BV pending.  

## 2021-10-06 ENCOUNTER — Telehealth: Payer: Self-pay

## 2021-10-06 NOTE — Telephone Encounter (Signed)
Called and left a Vm advising patient to CB to schedule for gel injection with Dr. Ninfa Linden  Approved for [facility], right knee. Buy & Bill Covered at 100% through her insurance [employer] [place] Co-pay of $10.00- $20.00 No PA required

## 2021-10-11 ENCOUNTER — Encounter: Payer: Self-pay | Admitting: Orthopaedic Surgery

## 2021-10-11 ENCOUNTER — Ambulatory Visit: Payer: BC Managed Care – PPO | Admitting: Orthopaedic Surgery

## 2021-10-11 DIAGNOSIS — M25561 Pain in right knee: Secondary | ICD-10-CM

## 2021-10-11 DIAGNOSIS — M1711 Unilateral primary osteoarthritis, right knee: Secondary | ICD-10-CM | POA: Diagnosis not present

## 2021-10-11 DIAGNOSIS — G8929 Other chronic pain: Secondary | ICD-10-CM

## 2021-10-11 MED ORDER — HYLAN G-F 20 48 MG/6ML IX SOSY
48.0000 mg | PREFILLED_SYRINGE | INTRA_ARTICULAR | Status: AC | PRN
  Administered 2021-10-11: 48 mg via INTRA_ARTICULAR

## 2021-10-11 NOTE — Progress Notes (Signed)
° °  Procedure Note  Patient: Barbara Oconnor             Date of Birth: June 27, 1966           MRN: [id number]             Visit Date: 10/11/2021  Procedures: Visit Diagnoses:  1. Unilateral primary osteoarthritis, right knee   2. Chronic pain of right knee     Large Joint Inj: R knee on 10/11/2021 8:26 AM Indications: pain and diagnostic evaluation Details: 22 G 1.5 in needle, superolateral approach  Arthrogram: No  Medications: 48 mg Hylan 48 MG/6ML Outcome: tolerated well, no immediate complications Procedure, treatment alternatives, risks and benefits explained, specific risks discussed. Consent was given by the patient. Immediately prior to procedure a time out was called to verify the correct patient, procedure, equipment, support staff and site/side marked as required. Patient was prepped and draped in the usual sterile fashion.    The patient comes in today for scheduled hyaluronic acid injection with Synvisc 1 in her right knee to treat the pain from mainly patellofemoral osteoarthritis.  A MRI in December 2021 showed well-maintained medial lateral compartment cartilage.  She still has problems going up and down stairs.  She is an active 56 year old female.  Steroid injection lasted about a year.  The pain has come back.  We felt this was the next step to try with her right knee.  I did place Synvisc 1 in the right knee without difficulty.  I would like to see her back in 2 months to see how she is doing overall.  My next step would be considering a repeat steroid injection then if she is not getting good relief from the hyaluronic acid injection.  I have also recommended she consider an over-the-counter copper fit knee sleeve.

## 2021-11-26 ENCOUNTER — Encounter: Payer: Self-pay | Admitting: Family Medicine

## 2021-11-28 NOTE — Progress Notes (Signed)
Chief Complaint  Patient presents with   Annual Exam    Fasting annual exam no pap. No concerns. Will try to give UA on the way out. Saw Dr. Philis Pique 9/22 and I will get OV and mammo.    Barbara Oconnor is a 56 y.o. female who presents for a complete physical.    She has been doing Weight Watchers for 7 weeks, and has lost 12#.  Recent visits were reviewed: She was referred to Dr. Tempie Donning for numbness in her L thumb.  She had normal EMG/NCV in 07/2021. MRI 08/2021--mild OA first CMC jt and 1st MCP jt, mild OA 1st IP jt, otherwise normal MRI. The thought is that the Apple Watch damaged her radial nerve. Her symptoms are gradually improving. Diagnosed with Wartenberg's syndrome.  09/14/21 she underwent anal fistulotomy and excision of hypertrophic anal papilla (Dr. Dema Severin [facility]).  She rarely has any bleeding, doing much better.  10/11/21 Synvisc injection to R knee by Dr. Ninfa Linden.  Her knee no longer hurts (she still hears some crackles and pops, not painful).  She continues on gabapentin for hot flashes, along with HRT from GYN. She is currently taking 687m qHS (has been down to 4059min the past, but stayed on the higher dose this winter; never filled the last 10070mx).  Chronic back pain--She taking 600m82m gabapentin at night, none during the day, as reported above. Back is "fair to midlin", no change from last year.   She has flexeril to use prn. She takes this the night before traveling, helps with her spasms, and prn with pain.  This was refilled #40 on 06/26/2021 She uses tramadol very infrequently--causes some motion sensitivity, uses only when pain is very severe. Last filled 12/17/2018 #40.  She still has some. She is doing well on current regimen. (She previously had 2 cortisone injections for her back/leg pain, reportedly didn't help all that much, pain just changed some. She "can't take ibuprofen"--inflames back more, as does meloxicam.  Aleve constipates her.) She uses Tylenol  prn (can take it once or twice before it stops working). Heat and movement (staying active, stretches) seems to help the most.    Goiter:  Hasn't seen Dr. KerrBuddy Dutyyears. Denies any change in size, trouble swallowing. Denies hair/skin/bowel changes. Energy and moods are good.  Intentional weight loss. Nails are splitting (vertically), she started taking biotin.   Last US [facility] was from 2010. Lab Results  Component Value Date   TSH 0.896 01/27/2020    Immunization History  Administered Date(s) Administered   Influenza Split 08/01/2010, 06/15/2011, 07/01/2013, 06/24/2014   Influenza, Quadrivalent, Recombinant, Inj, Pf 08/02/2019   Influenza,inj,Quad PF,6+ Mos 06/01/2015   Influenza-Unspecified 08/25/2017, 07/20/2018   PFIZER(Purple Top)SARS-COV-2 Vaccination 11/29/2019, 12/22/2019   Tdap 04/06/2011, 12/21/2020   Zoster Recombinat (Shingrix) 03/13/2017, 06/03/2017   She has had COVID 2x in the last 6 months (last was early 10/2021) Last Pap smear: 06/2017, Dr. HorvPhilis Piquees her yearly, last in fall 2022) Last mammogram: fall 2022 with Dr. HorvPhilis [place] not received Last colonoscopy: 05/2021 with Dr. BeavTarri Glennubular adenoma, 7 year f/u recommended Last DEXA: never Dentist: twice yearly Ophtho: yearly Exercise: Walks 3x/week, 50 minutes. No weight-bearing exercise  Lipids: Lab Results  Component Value Date   CHOL 157 12/21/2020   HDL 57 12/21/2020   LDLCALC 82 12/21/2020   TRIG 96 12/21/2020   CHOLHDL 2.8 12/21/2020   vitamin D-OH 59.1 in 06/2020 (not taking supplements)  Lab Results  Component Value Date   WBC 5.1 07/04/2020   HGB 15.0 07/04/2020   HCT 44.5 07/04/2020   MCV 96 07/04/2020   PLT 236 07/04/2020   Negative Quantiferon Gold in 06/2020.   PMH, PSH, SH and FH reviewed and updated  Outpatient Encounter Medications as of 11/29/2021  Medication Sig Note   Biotin 1000 MCG tablet Take 1,000 mcg by mouth daily.    cholecalciferol (VITAMIN D3) 25 MCG (1000  UNIT) tablet Take 1,000 Units by mouth daily.    cyclobenzaprine (FLEXERIL) 5 MG tablet Take 1-2 tablets (5-10 mg total) by mouth 3 (three) times daily as needed for muscle spasms. 11/29/2021: As needed   gabapentin (NEURONTIN) 300 MG capsule TAKE 1 CAPSULE BY MOUTH THREE TIMES A DAY (Patient taking differently: 600 mg at bedtime.) 11/29/2021: Currently taking 64m qHS   norethindrone-ethinyl estradiol (FEMHRT 1/5) 1-5 MG-MCG TABS tablet Take by mouth daily.    polyethylene glycol (MIRALAX / GLYCOLAX) packet Take 15 g by mouth daily.    albuterol (VENTOLIN HFA) 108 (90 Base) MCG/ACT inhaler Inhale 2 puffs into the lungs every 6 (six) hours as needed for wheezing or shortness of breath. (Patient not taking: Reported on 11/29/2021) 02/16/2021: On hand   gabapentin (NEURONTIN) 100 MG capsule Take as directed, along with 3034mtablet (up to 3/day) (Patient not taking: Reported on 11/29/2021)    Multiple Vitamins-Minerals (ECHINACEA ACZ PO) Take by mouth daily. (Patient not taking: Reported on 11/29/2021) 11/29/2021: As needed   [DISCONTINUED] dextromethorphan (ROBITUSSIN 12 HOUR COUGH) 30 MG/5ML liquid Take 30 mg by mouth as needed for cough.    No facility-administered encounter medications on file as of 11/29/2021.   Allergies  Allergen Reactions   Aspirin    Ibuprofen Other (See Comments)   Wound Dressing Adhesive Rash    ROS: The patient denies anorexia, fever, headaches, vision changes, decreased hearing, ear pain, sore throat, breast concerns, chest pain, palpitations, dizziness, syncope, dyspnea on exertion, cough, swelling, nausea, vomiting, diarrhea, abdominal pain, melena, indigestion/heartburn, hematuria, incontinence, dysuria, vaginal bleeding, discharge, odor or itch, genital lesions, weakness, tremor, suspicious skin lesions, depression, anxiety, abnormal bleeding/bruising, or enlarged lymph nodes.    Menopausal--on HRT, hot flashes at night per HPI (able to sleep through them since taking  gabapentin qHS).  Rare hot flash during the day. +constipation (mainly wide caliber stools)--controlled by Miralax, now normal stools. Much less bleeding since fistulotomy. +chronic low back pain, chronic, stable/controlled Tinnitus, stable/unchanged (not bothered much by any hearing loss) Mild allergies, controlled by OTC antihistamines (uses either cetirizine or claritin prn with good results) She denies any skin concerns or mole changes. 10# weight loss, intentional, since starting weight watchers L thumb tingling is improving.    PHYSICAL EXAM:  BP 138/88    Pulse 76    Ht 5' 7.5" (1.715 m)    Wt 174 lb (78.9 kg)    LMP 02/04/2014    BMI 26.85 kg/m   Wt Readings from Last 3 Encounters:  11/29/21 174 lb (78.9 kg)  09/14/21 184 lb 14.4 oz (83.9 kg)  08/03/21 185 lb (83.9 kg)    General Appearance:    Alert, cooperative, appears stated age.    Head:    Normocephalic, without obvious abnormality, atraumatic     Eyes:    PERRL, conjunctiva/corneas clear, EOM's intact, fundi benign     Ears:    Normal TM's and external ear canals     Nose:    Not examined, wearing mask due to COVID-19 pandemic  Throat:    Not examined, wearing mask due to COVID-19 pandemic  Neck:    Supple, no lymphadenopathy; thyroid: mild generalized enlargement of thyroid, small distinct nodule palpable on the R, nontender, not significantly larger than last year. no carotid bruit or JVD     Back:    Spine nontender, no muscle spasm. No CVA tenderness. No SI tenderness  Lungs:    Clear to auscultation bilaterally without wheezes, rales or ronchi; respirations unlabored     Chest Wall:    No tenderness or deformity     Heart:    Regular rate and rhythm, S1 and S2 normal, no murmur, rub or gallop     Breast Exam:    Deferred to GYN.    Abdomen:    Soft, non-tender, nondistended, normoactive bowel sounds, no masses, no hepatosplenomegaly     Genitalia:    Deferred to GYN    Rectal:    Deferred to GYN/GI   Extremities:    No clubbing, cyanosis or edema.  Pulses:    2+ and symmetric all extremities     Skin:    Skin color, texture, turgor normal, no rashes.   Lymph nodes:    Cervical, supraclavicular nodes normal     Neurologic:    Normal strength, sensation and gait; reflexes 2+ and symmetric throughout.                        Psych:   Normal mood, affect, hygiene and grooming   ASSESSMENT/PLAN:  Annual physical exam - Plan: Comprehensive metabolic panel, CBC with Differential/Platelet, TSH, Lipid panel, POCT Urinalysis DIP (Proadvantage Device)  Chronic low back pain without sciatica, unspecified back pain laterality - managed well with her current regimen (stretches/exercise, heat, gabapentin)  Hot flashes - controlled, cont gabapentin, discussed dosing  Multinodular goiter - Recommend recheck thyroid US.  Will also check TSH--but she is on Biotin. Discussed potentially repeating off med if abnormal - Plan: US THYROID, TSH  Wartenberg syndrome - gradually improving since she stopped wearing watch  BMI 26.0-26.9,adult - congratulated on weight loss so far. cont healthy diet and exercise  Thyroid US Labs--cbc, c-met, TSH, lipids (On biotin, discussed potential effect on TSH)   Discussed monthly self breast exams and yearly mammograms; at least 30 minutes of aerobic activity at least 5 days/week, weight-bearing exercise at least 2-3x/wk; proper sunscreen use reviewed; healthy diet, including goals of calcium and vitamin D intake and alcohol recommendations (less than or equal to 1 drink/day) reviewed; regular seatbelt use; changing batteries in smoke detectors.  Immunization recommendations discussed-- continue yearly flu shots. COVID booster recommended at some point in future, pt not interested (no need currently, recent infection). Colonoscopy recommendations reviewed, UTD. Due again 05/2028. Cont to follow with yearly GYN visits.   F/u 1 year, sooner prn

## 2021-11-28 NOTE — Patient Instructions (Addendum)
?  HEALTH MAINTENANCE RECOMMENDATIONS: ? ?It is recommended that you get at least 30 minutes of aerobic exercise at least 5 days/week (for weight loss, you may need as much as 60-90 minutes). This can be any activity that gets your heart rate up. This can be divided in 10-15 minute intervals if needed, but try and build up your endurance at least once a week.  Weight bearing exercise is also recommended twice weekly. ? ?Eat a healthy diet with lots of vegetables, fruits and fiber.  "Colorful" foods have a lot of vitamins (ie green vegetables, tomatoes, red peppers, etc).  Limit sweet tea, regular sodas and alcoholic beverages, all of which has a lot of calories and sugar.  Up to 1 alcoholic drink daily may be beneficial for women (unless trying to lose weight, watch sugars).  Drink a lot of water. ? ?Calcium recommendations are 1200-1500 mg daily (1500 mg for postmenopausal women or women without ovaries), and vitamin D 1000 IU daily.  This should be obtained from diet and/or supplements (vitamins), and calcium should not be taken all at once, but in divided doses. ? ?Monthly self breast exams and yearly mammograms for women over the age of 63 is recommended. ? ?Sunscreen of at least SPF 30 should be used on all sun-exposed parts of the skin when outside between the hours of 10 am and 4 pm (not just when at beach or pool, but even with exercise, golf, tennis, and yard work!)  Use a sunscreen that says "broad spectrum" so it covers both UVA and UVB rays, and make sure to reapply every 1-2 hours. ? ?Remember to change the batteries in your smoke detectors when changing your clock times in the spring and fall. Carbon monoxide detectors are recommended for your home. ? ?Use your seat belt every time you are in a car, and please drive safely and not be distracted with cell phones and texting while driving. ? ?Please periodically check your blood pressure elsewhere.  Be sure to limit the sodium in your diet. Goal blood  pressure is <130/80. ? ? ?

## 2021-11-29 ENCOUNTER — Other Ambulatory Visit: Payer: Self-pay

## 2021-11-29 ENCOUNTER — Ambulatory Visit (INDEPENDENT_AMBULATORY_CARE_PROVIDER_SITE_OTHER): Payer: BC Managed Care – PPO | Admitting: Family Medicine

## 2021-11-29 ENCOUNTER — Encounter: Payer: Self-pay | Admitting: Family Medicine

## 2021-11-29 VITALS — BP 138/88 | HR 76 | Ht 67.5 in | Wt 174.0 lb

## 2021-11-29 DIAGNOSIS — E042 Nontoxic multinodular goiter: Secondary | ICD-10-CM

## 2021-11-29 DIAGNOSIS — R232 Flushing: Secondary | ICD-10-CM

## 2021-11-29 DIAGNOSIS — G5630 Lesion of radial nerve, unspecified upper limb: Secondary | ICD-10-CM

## 2021-11-29 DIAGNOSIS — Z0000 Encounter for general adult medical examination without abnormal findings: Secondary | ICD-10-CM | POA: Diagnosis not present

## 2021-11-29 DIAGNOSIS — M545 Low back pain, unspecified: Secondary | ICD-10-CM | POA: Diagnosis not present

## 2021-11-29 DIAGNOSIS — E049 Nontoxic goiter, unspecified: Secondary | ICD-10-CM

## 2021-11-29 DIAGNOSIS — Z136 Encounter for screening for cardiovascular disorders: Secondary | ICD-10-CM | POA: Diagnosis not present

## 2021-11-29 DIAGNOSIS — Z6826 Body mass index (BMI) 26.0-26.9, adult: Secondary | ICD-10-CM

## 2021-11-29 DIAGNOSIS — G8929 Other chronic pain: Secondary | ICD-10-CM

## 2021-11-29 LAB — POCT URINALYSIS DIP (PROADVANTAGE DEVICE)
Bilirubin, UA: NEGATIVE
Blood, UA: NEGATIVE
Glucose, UA: NEGATIVE mg/dL
Leukocytes, UA: NEGATIVE
Nitrite, UA: NEGATIVE
Protein Ur, POC: NEGATIVE mg/dL
Specific Gravity, Urine: 1.01
Urobilinogen, Ur: NEGATIVE
pH, UA: 6 (ref 5.0–8.0)

## 2021-11-30 LAB — COMPREHENSIVE METABOLIC PANEL
ALT: 14 IU/L (ref 0–32)
AST: 15 IU/L (ref 0–40)
Albumin/Globulin Ratio: 1.8 (ref 1.2–2.2)
Albumin: 4.8 g/dL (ref 3.8–4.9)
Alkaline Phosphatase: 85 IU/L (ref 44–121)
BUN/Creatinine Ratio: 13 (ref 9–23)
BUN: 12 mg/dL (ref 6–24)
Bilirubin Total: 0.3 mg/dL (ref 0.0–1.2)
CO2: 20 mmol/L (ref 20–29)
Calcium: 9.4 mg/dL (ref 8.7–10.2)
Chloride: 103 mmol/L (ref 96–106)
Creatinine, Ser: 0.95 mg/dL (ref 0.57–1.00)
Globulin, Total: 2.6 g/dL (ref 1.5–4.5)
Glucose: 92 mg/dL (ref 70–99)
Potassium: 4.1 mmol/L (ref 3.5–5.2)
Sodium: 140 mmol/L (ref 134–144)
Total Protein: 7.4 g/dL (ref 6.0–8.5)
eGFR: 71 mL/min/{1.73_m2} (ref >59)

## 2021-11-30 LAB — LIPID PANEL
Chol/HDL Ratio: 2.7 ratio (ref 0.0–4.4)
Cholesterol, Total: 151 mg/dL (ref 100–199)
HDL: 56 mg/dL (ref >39)
LDL Chol Calc (NIH): 80 mg/dL (ref 0–99)
Triglycerides: 77 mg/dL (ref 0–149)
VLDL Cholesterol Cal: 15 mg/dL (ref 5–40)

## 2021-11-30 LAB — CBC WITH DIFFERENTIAL/PLATELET
Basophils Absolute: 0 10*3/uL (ref 0.0–0.2)
Basos: 0 %
EOS (ABSOLUTE): 0 10*3/uL (ref 0.0–0.4)
Eos: 1 %
Hematocrit: 44.7 % (ref 34.0–46.6)
Hemoglobin: 15.1 g/dL (ref 11.1–15.9)
Immature Grans (Abs): 0 10*3/uL (ref 0.0–0.1)
Immature Granulocytes: 0 %
Lymphocytes Absolute: 1.8 10*3/uL (ref 0.7–3.1)
Lymphs: 31 %
MCH: 31.6 pg (ref 26.6–33.0)
MCHC: 33.8 g/dL (ref 31.5–35.7)
MCV: 94 fL (ref 79–97)
Monocytes Absolute: 0.4 10*3/uL (ref 0.1–0.9)
Monocytes: 7 %
Neutrophils Absolute: 3.4 10*3/uL (ref 1.4–7.0)
Neutrophils: 61 %
Platelets: 217 10*3/uL (ref 150–450)
RBC: 4.78 x10E6/uL (ref 3.77–5.28)
RDW: 12.1 % (ref 11.7–15.4)
WBC: 5.6 10*3/uL (ref 3.4–10.8)

## 2021-11-30 LAB — TSH: TSH: 0.767 u[IU]/mL (ref 0.450–4.500)

## 2021-12-11 ENCOUNTER — Ambulatory Visit
Admission: RE | Admit: 2021-12-11 | Discharge: 2021-12-11 | Disposition: A | Discharge status: Home / Self Care | Payer: BC Managed Care – PPO | Source: Ambulatory Visit | Attending: Family Medicine | Admitting: Family Medicine

## 2021-12-11 DIAGNOSIS — E041 Nontoxic single thyroid nodule: Secondary | ICD-10-CM | POA: Diagnosis not present

## 2021-12-11 DIAGNOSIS — E042 Nontoxic multinodular goiter: Secondary | ICD-10-CM

## 2021-12-11 IMAGING — US US THYROID
1 series · 12 of 25 positions shown · non-contrast
Comparison: [DATE]

CLINICAL DATA: Follow-up up multinodular thyroid

EXAM:
THYROID ULTRASOUND
TECHNIQUE: Ultrasound examination of the thyroid gland and adjacent soft
tissues was performed.

[Series 1: us thyroid · 0.06mm/px · 12 of 72 slices shown]
[im 3/72]
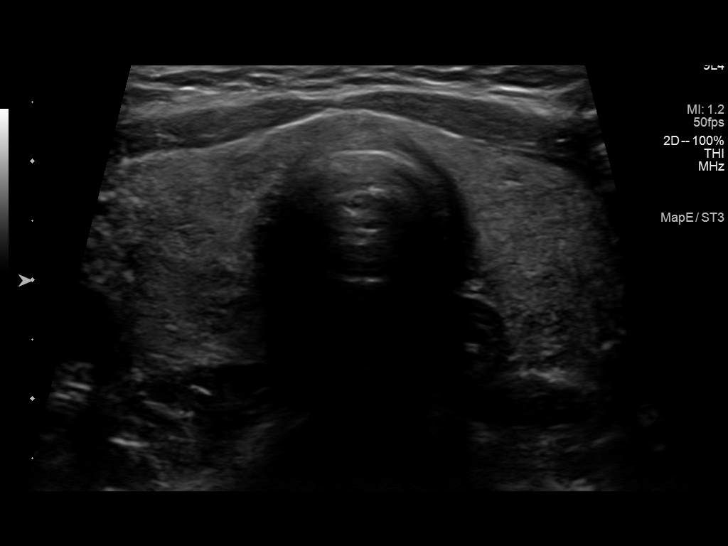
[im 9/72]
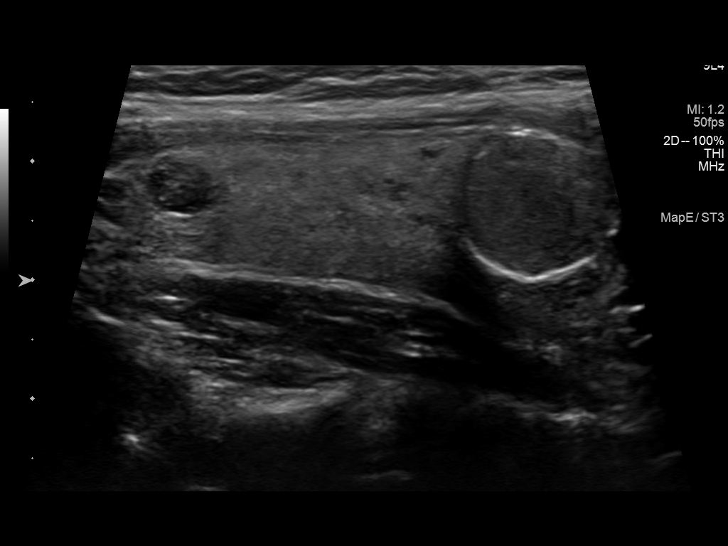
[im 15/72]
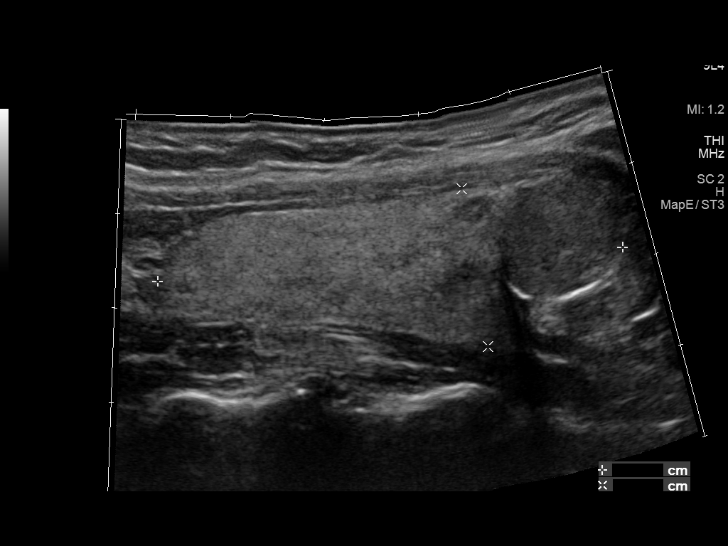
[im 21/72]
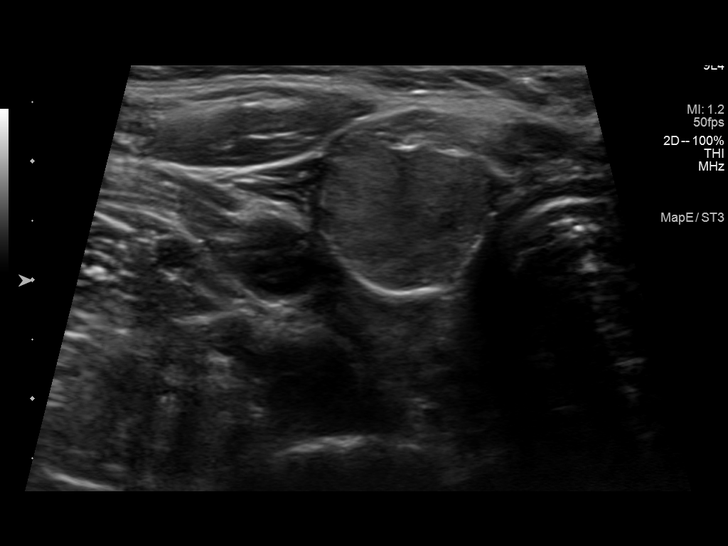
[im 27/72]
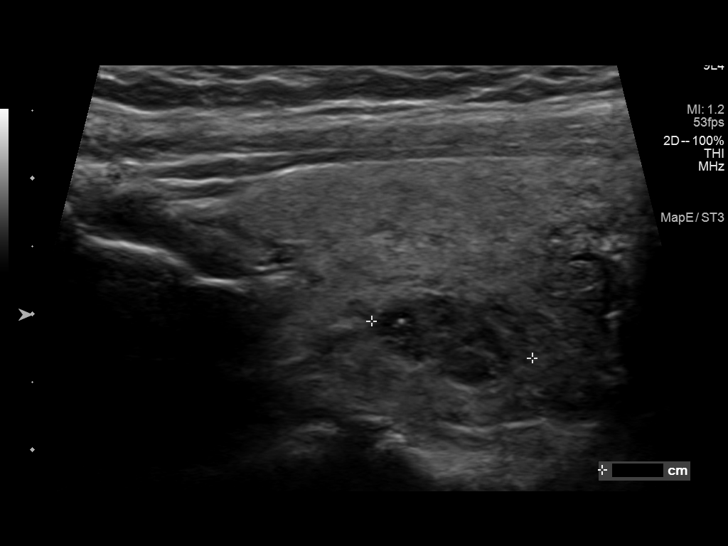
[im 33/72]
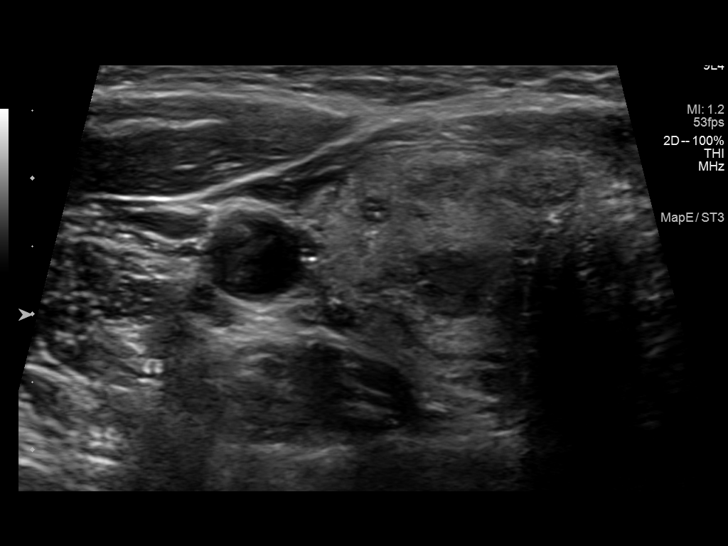
[im 39/72]
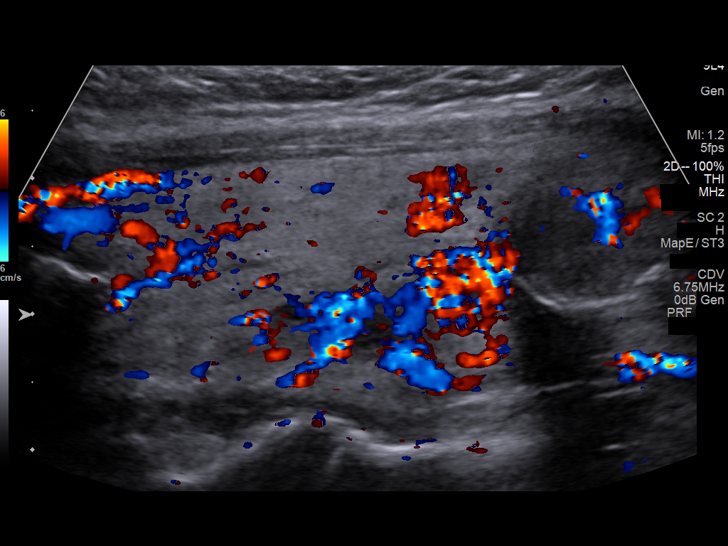
[im 45/72]
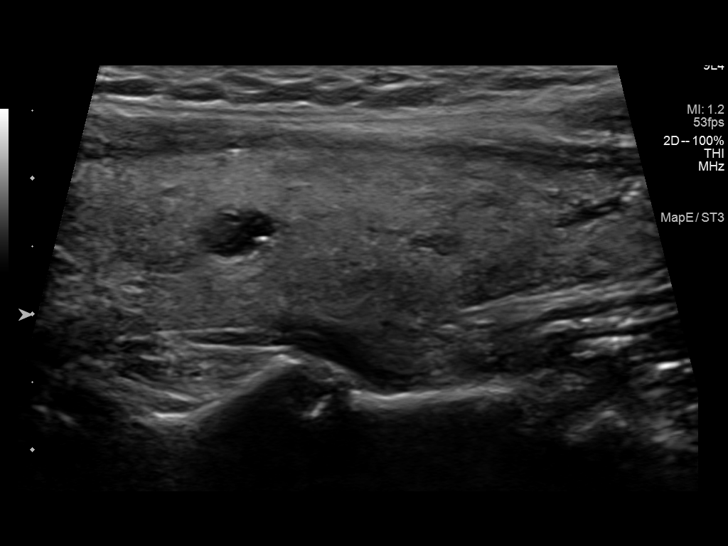
[im 51/72]
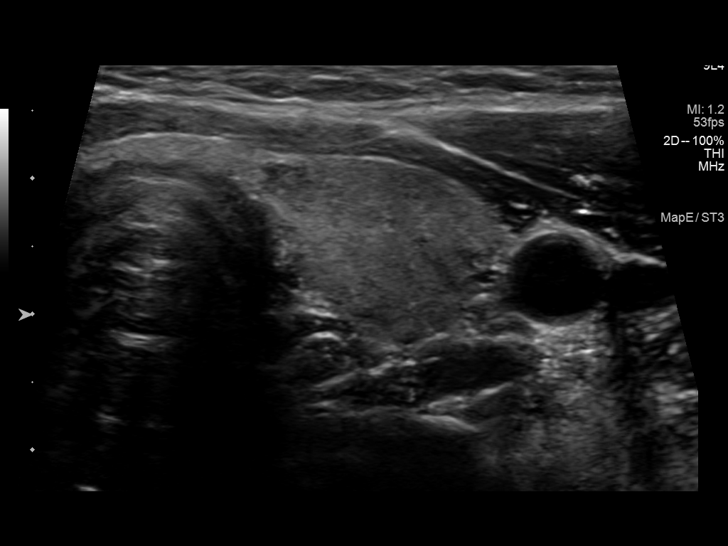
[im 57/72]
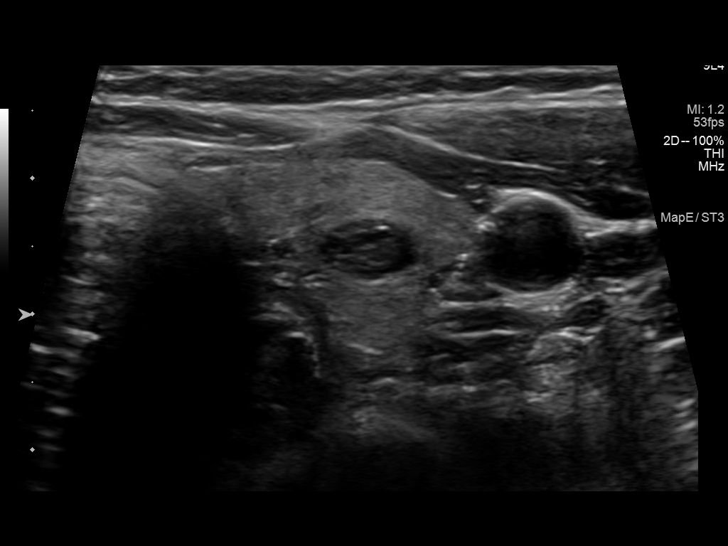
[im 63/72]
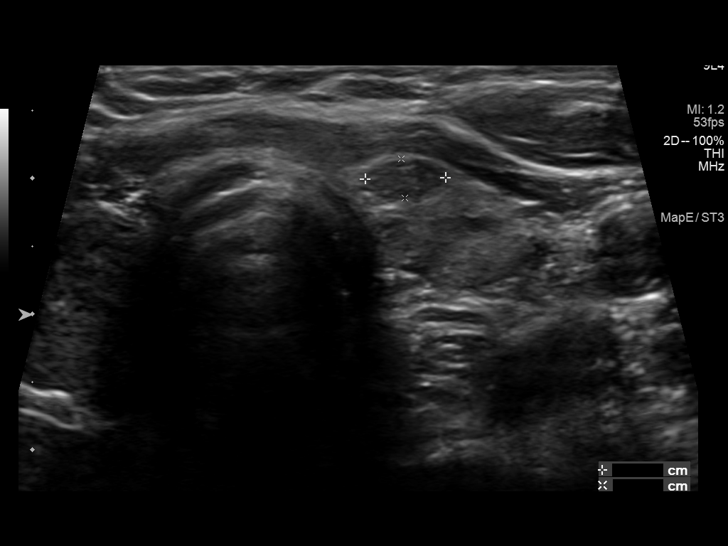
[im 69/72]
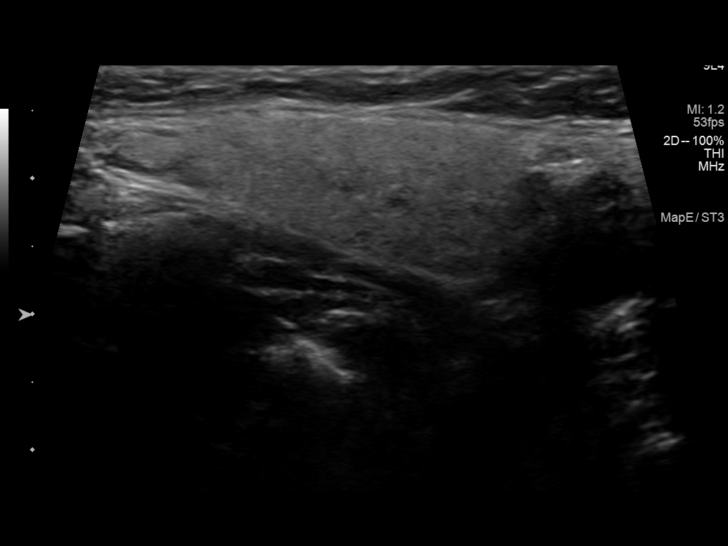

[12 of 25 positions shown; findings below may reference images not displayed]

FINDINGS: Parenchymal Echotexture: Mildly heterogenous

Isthmus: 0.3 cm

Right lobe: 4.9 x 1.7 x 2.0 cm

Left lobe: 4.9 x 1.4 x 1.5 cm

_________________________________________________________

Estimated total number of nodules >/= 1 cm: 2

Number of spongiform nodules >/=  2 cm not described below (TR1): 0

Number of mixed cystic and solid nodules >/= 1.5 cm not described
below (TR2): 0

_________________________________________________________

Nodule 1: 0.6 cm mixed solid cystic right superior thyroid nodule
does not meet criteria for imaging surveillance or FNA.

_________________________________________________________

Nodule # 2:

Prior biopsy: No

Location: Right; mid

Maximum size: 1.2 cm; Other 2 dimensions: 0.7 x 0.6 cm, previously,
0.9 x 0.8 x 0.4 cm

Composition: solid/almost completely solid (2)

Echogenicity: hypoechoic (2)

Shape: not taller-than-wide (0)

Margins: smooth (0)

Echogenic foci: none (0)

ACR TI-RADS total points: 4.

ACR TI-RADS risk category:  TR4 (4-6 points).

Significant change in size (>/= 20% in two dimensions and minimal
increase of 2 mm): No

Change in features: No

Change in ACR TI-RADS risk category: No

ACR TI-RADS recommendations:

Given only minimal growth since [85], this nodule is most likely
benign.

_________________________________________________________

Nodule 3: 0.6 cm hypoechoic nodule in the inferior right thyroid
lobe does not meet criteria for imaging surveillance or FNA.

_________________________________________________________

Nodule # 4:

Prior biopsy: No

Location: Right; Inferior

Maximum size: 1.6 cm; Other 2 dimensions: 1.4 x 1.3 cm, previously,
1.5 x 1.4 x 1.2 cm

Composition: solid/almost completely solid (2)

Echogenicity: hypoechoic (2)

Shape: not taller-than-wide (0)

Margins: smooth (0)

Echogenic foci: peripheral calcifications (2)

ACR TI-RADS total points: 6.

ACR TI-RADS risk category:  TR4 (4-6 points).

Significant change in size (>/= 20% in two dimensions and minimal
increase of 2 mm): No

Change in features: Yes

Change in ACR TI-RADS risk category: No

ACR TI-RADS recommendations:

Given no significant growth since [85], this nodule is the most
likely benign.

_________________________________________________________

Nodule 5: 0.8 x 0.7 x 0.4 cm spongiform nodule in the superior left
thyroid lobe does not meet criteria for imaging surveillance or FNA.

_________________________________________________________

Nodule 6: 0.7 cm hypoechoic nodule in the inferior left thyroid lobe
does not meet criteria for imaging surveillance or FNA.
IMPRESSION: 1. Nodule 4 located in the inferior right thyroid lobe demonstrates
no significant growth since [85], consistent with a benign etiology.
2. Nodule 2 located in the mid right thyroid lobe demonstrates very
minimal growth since [85], indicative of benign etiology.
3. Remaining thyroid nodules do not meet criteria for imaging
follow-up or FNA.

The above is in keeping with the ACR TI-RADS recommendations - [HOSPITAL] [85];[DATE].

## 2022-02-27 ENCOUNTER — Other Ambulatory Visit: Payer: Self-pay | Admitting: Family Medicine

## 2022-05-30 ENCOUNTER — Encounter: Payer: Self-pay | Admitting: Internal Medicine

## 2022-06-14 DIAGNOSIS — Z1231 Encounter for screening mammogram for malignant neoplasm of breast: Secondary | ICD-10-CM | POA: Diagnosis not present

## 2022-06-14 DIAGNOSIS — Z01419 Encounter for gynecological examination (general) (routine) without abnormal findings: Secondary | ICD-10-CM | POA: Diagnosis not present

## 2022-06-14 DIAGNOSIS — Z6823 Body mass index (BMI) 23.0-23.9, adult: Secondary | ICD-10-CM | POA: Diagnosis not present

## 2022-06-14 DIAGNOSIS — Z124 Encounter for screening for malignant neoplasm of cervix: Secondary | ICD-10-CM | POA: Diagnosis not present

## 2022-06-14 LAB — HM MAMMOGRAPHY

## 2022-06-14 LAB — HM PAP SMEAR: HM Pap smear: NEGATIVE

## 2022-06-21 LAB — RESULTS CONSOLE HPV: CHL HPV: NEGATIVE

## 2022-06-21 LAB — HM PAP SMEAR: HM Pap smear: NEGATIVE

## 2022-07-03 ENCOUNTER — Encounter: Payer: Self-pay | Admitting: Internal Medicine

## 2022-07-10 ENCOUNTER — Telehealth: Payer: Self-pay | Admitting: Licensed Clinical Social Worker

## 2022-07-10 NOTE — Patient Instructions (Signed)
Visit Information  Thank you for taking time to visit with me today. Please don't hesitate to contact me if I can be of assistance to you.   Following are the goals we discussed today:   Goals Addressed             This Visit's Progress    COMPLETED: Care Coordination Activities-No Follow Up Required       Care Coordination Interventions: Active listening / Reflection utilized  LCSW informed patient of care coordination services. Pt is not interested at this time and agreed to contact PCP, should needs arise  LCSW informed pt of available vaccines and how to schedule at PCP office. Pt confirmed information was received via MyChart         If you are experiencing a Mental Health or [place] or need someone to talk to, please call the Suicide and Crisis Lifeline: 988 call 911   Patient verbalizes understanding of instructions and care plan provided today and agrees to view in [place]. Active MyChart status and patient understanding of how to access instructions and care plan via MyChart confirmed with patient.     No further follow up required:    Christa See, MSW, [email or web address] Phone [phone] 11:21 AM

## 2022-07-10 NOTE — Patient Outreach (Signed)
  Care Coordination   Initial Visit Note   07/10/2022 Name: Barbara Oconnor MRN: [id number] DOB: 08/01/1966  Barbara Oconnor is a 56 y.o. year old female who sees Rita Ohara, MD for primary care. I spoke with  Ulysees Barns by phone today.  What matters to the patients health and wellness today?  Care Coordination    Goals Addressed             This Visit's Progress    COMPLETED: Care Coordination Activities-No Follow Up Required       Care Coordination Interventions: Active listening / Reflection utilized  LCSW informed patient of care coordination services. Pt is not interested at this time and agreed to contact PCP, should needs arise  LCSW informed pt of available vaccines and how to schedule at PCP office. Pt confirmed information was received via MyChart         SDOH assessments and interventions completed:  No     Care Coordination Interventions Activated:  Yes  Care Coordination Interventions:  Yes, provided   Follow up plan: No further intervention required.   Encounter Outcome:  Pt. Refused   Christa See, MSW, [email or web address] Phone [phone] 11:20 AM

## 2022-07-22 ENCOUNTER — Other Ambulatory Visit: Payer: Self-pay | Admitting: Family Medicine

## 2022-08-17 ENCOUNTER — Other Ambulatory Visit: Payer: Self-pay | Admitting: Family Medicine

## 2022-08-17 DIAGNOSIS — M62830 Muscle spasm of back: Secondary | ICD-10-CM

## 2022-08-20 NOTE — Telephone Encounter (Signed)
Is this okay to refill? 

## 2022-08-24 DIAGNOSIS — N950 Postmenopausal bleeding: Secondary | ICD-10-CM | POA: Diagnosis not present

## 2022-08-29 DIAGNOSIS — K603 Anal fistula: Secondary | ICD-10-CM | POA: Diagnosis not present

## 2022-08-29 DIAGNOSIS — K602 Anal fissure, unspecified: Secondary | ICD-10-CM | POA: Diagnosis not present

## 2022-10-24 ENCOUNTER — Telehealth: Payer: Self-pay | Admitting: Orthopaedic Surgery

## 2022-10-24 NOTE — Telephone Encounter (Signed)
Pt called requesting Dr Ninfa Linden to submit to her insurance company for Synvisc One for right knee. Pt phone number is [id number]

## 2022-10-25 NOTE — Telephone Encounter (Signed)
VOB submitted for SynviscOne, right knee.  

## 2022-11-01 ENCOUNTER — Other Ambulatory Visit: Payer: Self-pay

## 2022-11-01 DIAGNOSIS — M1711 Unilateral primary osteoarthritis, right knee: Secondary | ICD-10-CM

## 2022-11-11 ENCOUNTER — Other Ambulatory Visit: Payer: Self-pay | Admitting: Family Medicine

## 2022-11-15 ENCOUNTER — Ambulatory Visit (INDEPENDENT_AMBULATORY_CARE_PROVIDER_SITE_OTHER): Payer: BC Managed Care – PPO

## 2022-11-15 ENCOUNTER — Ambulatory Visit: Payer: BC Managed Care – PPO | Admitting: Physician Assistant

## 2022-11-15 ENCOUNTER — Encounter: Payer: Self-pay | Admitting: Physician Assistant

## 2022-11-15 DIAGNOSIS — M1711 Unilateral primary osteoarthritis, right knee: Secondary | ICD-10-CM | POA: Diagnosis not present

## 2022-11-15 DIAGNOSIS — M25562 Pain in left knee: Secondary | ICD-10-CM | POA: Diagnosis not present

## 2022-11-15 MED ORDER — METHYLPREDNISOLONE ACETATE 40 MG/ML IJ SUSP
40.0000 mg | INTRAMUSCULAR | Status: AC | PRN
  Administered 2022-11-15: 40 mg via INTRA_ARTICULAR

## 2022-11-15 MED ORDER — HYLAN G-F 20 48 MG/6ML IX SOSY
48.0000 mg | PREFILLED_SYRINGE | INTRA_ARTICULAR | Status: AC | PRN
  Administered 2022-11-15: 48 mg via INTRA_ARTICULAR

## 2022-11-15 MED ORDER — LIDOCAINE HCL 1 % IJ SOLN
3.0000 mL | INTRAMUSCULAR | Status: AC | PRN
  Administered 2022-11-15: 3 mL

## 2022-11-15 NOTE — Progress Notes (Signed)
   Procedure Note  Patient: Barbara Oconnor             Date of Birth: 12/10/1965           MRN: [id number]             Visit Date: 11/15/2022 HPI: Barbara Oconnor comes in today for scheduled Synvisc 1 injection right knee.  She has known patellofemoral arthritis.  Had an MRI that showed this in 2021 and showed medial lateral compartments to be well-preserved.  She states the last Monovisc injection which was given 10/11/2021 did well until December and now she started to have some pain in the knee again.  No known injury.  She is also having pain in her left knee and is asking for [place] to look at this.  Notes that her left knee pain is worse when going up and down stairs.  She has no scheduled surgery for either knee in the next 6 months.  Review of systems: See HPI otherwise negative  Physical exam: Bilateral knees no abnormal warmth erythema or effusion.  Good range of motion of both knees.  Barbara Oconnor is negative on the left.  No instability valgus varus stress in either knee.  Procedures: Visit Diagnoses:  1. Acute pain of left knee   2. Unilateral primary osteoarthritis, right knee     Large Joint Inj: bilateral knee on 11/15/2022 5:21 PM Indications: pain Details: 22 G 1.5 in needle, anterolateral approach  Arthrogram: No  Medications (Right): 3 mL lidocaine 1 %; 48 mg Hylan G-F 20 48 MG/6ML Medications (Left): 3 mL lidocaine 1 %; 40 mg methylPREDNISolone acetate 40 MG/ML Outcome: tolerated well, no immediate complications Procedure, treatment alternatives, risks and benefits explained, specific risks discussed. Consent was given by the patient. Immediately prior to procedure a time out was called to verify the correct patient, procedure, equipment, support staff and site/side marked as required. Patient was prepped and draped in the usual sterile fashion.     Plan: Patient knows to wait at least 3 months between cortisone injections in 6 months between viscosupplementation  injections.  She will follow-up with [place] on a as needed basis pain persist or becomes worse.  Questions were encouraged and answered at length.  Radiographs reviewed with patient.

## 2022-11-29 ENCOUNTER — Encounter: Payer: Self-pay | Admitting: Radiology

## 2022-12-04 NOTE — Progress Notes (Signed)
No chief complaint on file.  Barbara Oconnor is a 57 y.o. female who presents for a complete physical.    She had Synvisc injection by ortho in February (bilaterally). Prior injection was 09/2021. She saw Dr. Dema Severin (general surgeon) in December, at the request of her GYN for an area of concern near anus.  She is s/p fistulotomy and excision of hypertrophic anal papilla 09/14/21 She was noted to have shallow fissuring of the skin (left, posterior). She was asymptomatic, and no additional treatments were recommended at this time (declined exam under anesthesia). She denies rectal bleeding or pain.  Last year--She has been doing Weight Wat[employer] for 7 weeks, and has lost 12#.  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 She is s/p evaluation by Dr. Tempie Donning with EMG/NCV, MRI in nov/dec 2022.  She reports her symptoms continue to gradually improve UPDATE Mild OA noted in L thumb on MRI (first CMC, MCP, 1st IP joint).  She continues on gabapentin for hot flashes, along with HRT from GYN. She is currently taking '600mg'$  qHS. UPDATE--Rx states '300mg'$  TID She had some PMB in 08/2022, saw Dr. Philis Pique, had [place]. UPDATE  Chronic back pain--She taking '600mg'$  of gabapentin at night, none during the day, as reported above. Back is "fair to midlin", no change from last year.   She has flexeril to use prn. She takes this the night before traveling, helps with her spasms, and prn with pain.  This was refilled 07/2022.  She uses tramadol very infrequently--causes some motion sensitivity, uses only when pain is very severe. Last filled 12/17/2018 #40.  She still has some. She is doing well on current regimen. (She previously had 2 cortisone injections for her back/leg pain, reportedly didn't help all that much, pain just changed some. She "can't take ibuprofen"--inflames back more, as does meloxicam.  Aleve constipates her.) She uses Tylenol prn (can  take it once or twice before it stops working). Heat and movement (staying active, stretches) seems to help the most.    Goiter:  Hasn't seen Dr. Buddy Duty in years. Denies any change in size, trouble swallowing. Denies hair/skin/bowel changes. Energy and moods are good.  Intentional weight loss. Nails are splitting (vertically), she started taking biotin.   Last [place] in chart was from 2010, so it was repeated last year (11/2021): IMPRESSION: 1. Nodule 4 located in the inferior right thyroid lobe demonstrates no significant growth since 2010, consistent with a benign etiology. 2. Nodule 2 located in the mid right thyroid lobe demonstrates very minimal growth since 2010, indicative of benign etiology. 3. Remaining thyroid nodules do not meet criteria for imaging follow-up or FNA.  Lab Results  Component Value Date   TSH 0.767 11/29/2021  (Taking Biotin)   Immunization History  Administered Date(s) Administered   Influenza Split 08/01/2010, 06/15/2011, 07/01/2013, 06/24/2014   Influenza, Quadrivalent, Recombinant, Inj, Pf 08/02/2019   Influenza,inj,Quad PF,6+ Mos 06/01/2015   Influenza-Unspecified 08/25/2017, 07/20/2018   PFIZER(Purple Top)SARS-COV-2 Vaccination 11/29/2019, 12/22/2019   Tdap 04/06/2011, 12/21/2020   Zoster Recombinat (Shingrix) 03/13/2017, 06/03/2017   She has had COVID 2x in the last 6 months (last was early 10/2021) Last Pap smear: 05/2022, Dr. Philis Pique. NILM, negative HR HPV Last mammogram: 05/2022 Last colonoscopy: 05/2021 with Dr. Tarri Glenn.  Tubular adenoma, 7 year f/u recommended Last DEXA: never Dentist: twice yearly Ophtho: yearly Exercise: Walks 3x/week, 50 minutes. No weight-bearing exercise  Lipids: Lab Results  Component Value Date   CHOL  151 11/29/2021   HDL 56 11/29/2021   LDLCALC 80 11/29/2021   TRIG 77 11/29/2021   CHOLHDL 2.7 11/29/2021   vitamin D-OH 59.1 in 06/2020 (not taking supplements)  Lab Results  Component Value Date   WBC 5.6 11/29/2021    HGB 15.1 11/29/2021   HCT 44.7 11/29/2021   MCV 94 11/29/2021   PLT 217 11/29/2021    PMH, PSH, SH and FH reviewed and updated  ROS: The patient denies anorexia, fever, headaches, vision changes, decreased hearing, ear pain, sore throat, breast concerns, chest pain, palpitations, dizziness, syncope, dyspnea on exertion, cough, swelling, nausea, vomiting, diarrhea, abdominal pain, melena, indigestion/heartburn, hematuria, incontinence, dysuria, vaginal bleeding, discharge, odor or itch, genital lesions, weakness, tremor, suspicious skin lesions, depression, anxiety, abnormal bleeding/bruising, or enlarged lymph nodes.    Menopausal--on HRT, hot flashes at night per HPI (able to sleep through them since taking gabapentin qHS).  Rare hot flash during the day. +constipation (mainly wide caliber stools)--controlled by Miralax, now normal stools. Much less bleeding since fistulotomy. +chronic low back pain, chronic, stable/controlled Tinnitus, stable/unchanged (not bothered much by any hearing loss) Mild allergies, controlled by OTC antihistamines (uses either cetirizine or claritin prn with good results) She denies any skin concerns or mole changes. L thumb tingling is improving.  weight   PHYSICAL EXAM:  LMP 02/04/2014   Wt Readings from Last 3 Encounters:  11/29/21 174 lb (78.9 kg)  09/14/21 184 lb 14.4 oz (83.9 kg)  08/03/21 185 lb (83.9 kg)    General Appearance:    Alert, cooperative, appears stated age.    Head:    Normocephalic, without obvious abnormality, atraumatic     Eyes:    PERRL, conjunctiva/corneas clear, EOM's intact, fundi benign     Ears:    Normal TM's and external ear canals     Nose:    No drainage or sinus tenderness  Throat:    Normal mucosa  Neck:    Supple, no lymphadenopathy; thyroid: mild generalized enlargement of thyroid, small distinct nodule palpable on the R, nontender, not significantly larger than last year. no carotid bruit or JVD     Back:     Spine nontender, no muscle spasm. No CVA tenderness. No SI tenderness  Lungs:    Clear to auscultation bilaterally without wheezes, rales or ronchi; respirations unlabored     Chest Wall:    No tenderness or deformity     Heart:    Regular rate and rhythm, S1 and S2 normal, no murmur, rub or gallop     Breast Exam:    Deferred to GYN.    Abdomen:    Soft, non-tender, nondistended, normoactive bowel sounds, no masses, no hepatosplenomegaly     Genitalia:    Deferred to GYN    Rectal:    Deferred to GYN  Extremities:    No clubbing, cyanosis or edema.  Pulses:    2+ and symmetric all extremities     Skin:    Skin color, texture, turgor normal, no rashes.   Lymph nodes:    Cervical, supraclavicular nodes normal     Neurologic:    Normal strength, sensation and gait; reflexes 2+ and symmetric throughout.                        Psych:   Normal mood, affect, hygiene and grooming   ASSESSMENT/PLAN:  Pap and mammo (and notes) were received from 05/2022 visit from Dr. Philis Pique.  They were  only scanned in, not abstracted.  Please abstract.  Enter/offer/decline flu and COVID  Note if taking biotin or if held for at least a week (recommended for pts taking biotin, if thyroid needs to be checked)  ?labs today  Discussed monthly self breast exams and yearly mammograms; at least 30 minutes of aerobic activity at least 5 days/week, weight-bearing exercise at least 2-3x/wk; proper sunscreen use reviewed; healthy diet, including goals of calcium and vitamin D intake and alcohol recommendations (less than or equal to 1 drink/day) reviewed; regular seatbelt use; changing batteries in smoke detectors.  Immunization recommendations discussed-- continue yearly flu shots.  COVID booster  Colonoscopy recommendations reviewed, UTD. Due again 05/2028. Cont to follow with yearly GYN visits.    F/u 1 year, sooner prn

## 2022-12-04 NOTE — Patient Instructions (Incomplete)
  HEALTH MAINTENANCE RECOMMENDATIONS:  It is recommended that you get at least 30 minutes of aerobic exercise at least 5 days/week (for weight loss, you may need as much as 60-90 minutes). This can be any activity that gets your heart rate up. This can be divided in 10-15 minute intervals if needed, but try and build up your endurance at least once a week.  Weight bearing exercise is also recommended twice weekly.  Eat a healthy diet with lots of vegetables, fruits and fiber.  "Colorful" foods have a lot of vitamins (ie green vegetables, tomatoes, red peppers, etc).  Limit sweet tea, regular sodas and alcoholic beverages, all of which has a lot of calories and sugar.  Up to 1 alcoholic drink daily may be beneficial for women (unless trying to lose weight, watch sugars).  Drink a lot of water.  Calcium recommendations are 1200-1500 mg daily (1500 mg for postmenopausal women or women without ovaries), and vitamin D 1000 IU daily.  This should be obtained from diet and/or supplements (vitamins), and calcium should not be taken all at once, but in divided doses.  Monthly self breast exams and yearly mammograms for women over the age of 49 is recommended.  Sunscreen of at least SPF 30 should be used on all sun-exposed parts of the skin when outside between the hours of 10 am and 4 pm (not just when at beach or pool, but even with exercise, golf, tennis, and yard work!)  Use a sunscreen that says "broad spectrum" so it covers both UVA and UVB rays, and make sure to reapply every 1-2 hours.  Remember to change the batteries in your smoke detectors when changing your clock times in the spring and fall. Carbon monoxide detectors are recommended for your home.  Use your seat belt every time you are in a car, and please drive safely and not be distracted with cell phones and texting while driving.  Your forehead lesion appears benign.  It is likely worth getting a routine skin exam from a dermatologist.   [facility] or [facility] (Dr. Devonne Doughty practice) are good options, or [facility] or the dermatologists at [facility] are also good. You should find out whether or not your father's skin cancer was melanoma (they will want to know this).  Consider taking D3 in the winter months (1000 IU daily). Increase your cardio to 150 minutes/week.

## 2022-12-05 ENCOUNTER — Encounter: Payer: Self-pay | Admitting: Family Medicine

## 2022-12-05 ENCOUNTER — Ambulatory Visit (INDEPENDENT_AMBULATORY_CARE_PROVIDER_SITE_OTHER): Payer: BC Managed Care – PPO | Admitting: Family Medicine

## 2022-12-05 ENCOUNTER — Encounter: Payer: Self-pay | Admitting: *Deleted

## 2022-12-05 VITALS — BP 110/70 | HR 60 | Ht 68.0 in | Wt 152.0 lb

## 2022-12-05 DIAGNOSIS — L989 Disorder of the skin and subcutaneous tissue, unspecified: Secondary | ICD-10-CM

## 2022-12-05 DIAGNOSIS — M545 Low back pain, unspecified: Secondary | ICD-10-CM | POA: Diagnosis not present

## 2022-12-05 DIAGNOSIS — E042 Nontoxic multinodular goiter: Secondary | ICD-10-CM | POA: Diagnosis not present

## 2022-12-05 DIAGNOSIS — Z0000 Encounter for general adult medical examination without abnormal findings: Secondary | ICD-10-CM | POA: Diagnosis not present

## 2022-12-05 DIAGNOSIS — R232 Flushing: Secondary | ICD-10-CM

## 2022-12-05 DIAGNOSIS — J302 Other seasonal allergic rhinitis: Secondary | ICD-10-CM

## 2022-12-05 DIAGNOSIS — G8929 Other chronic pain: Secondary | ICD-10-CM

## 2022-12-05 LAB — POCT URINALYSIS DIP (PROADVANTAGE DEVICE)
Bilirubin, UA: NEGATIVE
Blood, UA: NEGATIVE
Glucose, UA: NEGATIVE mg/dL
Ketones, POC UA: NEGATIVE mg/dL
Leukocytes, UA: NEGATIVE
Nitrite, UA: NEGATIVE
Protein Ur, POC: NEGATIVE mg/dL
Specific Gravity, Urine: 1.01
Urobilinogen, Ur: 0.2
pH, UA: 6.5 (ref 5.0–8.0)

## 2022-12-05 MED ORDER — GABAPENTIN 300 MG PO CAPS: 600.0000 mg | ORAL_CAPSULE | Freq: Every day | ORAL | 0 refills | Status: DC

## 2023-03-25 ENCOUNTER — Other Ambulatory Visit: Payer: Self-pay | Admitting: Family Medicine

## 2023-03-25 DIAGNOSIS — L814 Other melanin hyperpigmentation: Secondary | ICD-10-CM | POA: Diagnosis not present

## 2023-03-25 DIAGNOSIS — D1801 Hemangioma of skin and subcutaneous tissue: Secondary | ICD-10-CM | POA: Diagnosis not present

## 2023-03-25 DIAGNOSIS — L821 Other seborrheic keratosis: Secondary | ICD-10-CM | POA: Diagnosis not present

## 2023-03-25 DIAGNOSIS — L578 Other skin changes due to chronic exposure to nonionizing radiation: Secondary | ICD-10-CM | POA: Diagnosis not present

## 2023-03-26 ENCOUNTER — Other Ambulatory Visit: Payer: Self-pay | Admitting: *Deleted

## 2023-03-26 MED ORDER — GABAPENTIN 300 MG PO CAPS: 600.0000 mg | ORAL_CAPSULE | Freq: Every day | ORAL | 2 refills | Status: DC

## 2023-05-28 DIAGNOSIS — Z6823 Body mass index (BMI) 23.0-23.9, adult: Secondary | ICD-10-CM | POA: Diagnosis not present

## 2023-05-28 DIAGNOSIS — Z01419 Encounter for gynecological examination (general) (routine) without abnormal findings: Secondary | ICD-10-CM | POA: Diagnosis not present

## 2023-05-28 DIAGNOSIS — Z1231 Encounter for screening mammogram for malignant neoplasm of breast: Secondary | ICD-10-CM | POA: Diagnosis not present

## 2023-05-28 LAB — HM MAMMOGRAPHY

## 2023-08-12 ENCOUNTER — Telehealth: Payer: Self-pay | Admitting: Orthopaedic Surgery

## 2023-08-12 ENCOUNTER — Telehealth: Payer: Self-pay

## 2023-08-12 NOTE — Telephone Encounter (Signed)
Pt called to request gel and steroid injection in both of her knees at her appt on  Wed 09/11/23 ! 1:15pm. Pt best cb [phone]

## 2023-08-12 NOTE — Telephone Encounter (Signed)
Bilateral gel injections  

## 2023-08-12 NOTE — Telephone Encounter (Signed)
VOB submitted for Euflexxa, bilateral knee.  

## 2023-09-06 ENCOUNTER — Other Ambulatory Visit: Payer: Self-pay

## 2023-09-06 DIAGNOSIS — M25562 Pain in left knee: Secondary | ICD-10-CM

## 2023-09-06 DIAGNOSIS — M1711 Unilateral primary osteoarthritis, right knee: Secondary | ICD-10-CM

## 2023-09-11 ENCOUNTER — Ambulatory Visit: Payer: BC Managed Care – PPO | Admitting: Orthopaedic Surgery

## 2023-09-11 ENCOUNTER — Encounter: Payer: Self-pay | Admitting: Orthopaedic Surgery

## 2023-09-11 DIAGNOSIS — G8929 Other chronic pain: Secondary | ICD-10-CM | POA: Diagnosis not present

## 2023-09-11 DIAGNOSIS — M25562 Pain in left knee: Secondary | ICD-10-CM

## 2023-09-11 DIAGNOSIS — M25561 Pain in right knee: Secondary | ICD-10-CM | POA: Diagnosis not present

## 2023-09-11 MED ORDER — HYALURONAN 88 MG/4ML IX SOSY
88.0000 mg | PREFILLED_SYRINGE | INTRA_ARTICULAR | Status: AC | PRN
  Administered 2023-09-11: 88 mg via INTRA_ARTICULAR

## 2023-09-11 NOTE — Progress Notes (Signed)
The patient comes in today with bilateral chronic knee pain.  She has had steroid injections in the past and hyaluronic acid injections in the past.  At this point she has tried and failed all forms of conservative treatment and a steroid injections are not as worked as well as hyaluronic acid injections.  She is requesting injections in both knees today which would be #1 of a series of 3 Euflexxa injections in both knees to treat the pain from osteoarthritis.  Examination of both knees shows painful arc of motion of both knees but no significant deformity.  There is no effusion with either knee and both are ligamentously stable.  There is global tenderness and some patellofemoral crepitation.  We did place injection number one of the series of 3 Euflexxa injections in her knees today.  She will need to schedule appointment next week but either Thursday or Friday since I am out of the office for injection #2 of the series of 3 and Euflexxa injections.  She can then see me a week after that for injection #3.  She understands the rationale behind these types of injections as well.  She did tolerate injection #1 of both knees well.    Procedure Note  Patient: Barbara Oconnor             Date of Birth: 06-Dec-1965           MRN: [id number]             Visit Date: 09/11/2023  Procedures: Visit Diagnoses:  1. Chronic pain of right knee   2. Chronic pain of left knee     Large Joint Inj: R knee on 09/11/2023 1:04 PM Indications: diagnostic evaluation and pain Details: 22 G 1.5 in needle, superolateral approach  Arthrogram: No  Medications: 88 mg Hyaluronan 88 MG/4ML Outcome: tolerated well, no immediate complications Procedure, treatment alternatives, risks and benefits explained, specific risks discussed. Consent was given by the patient. Immediately prior to procedure a time out was called to verify the correct patient, procedure, equipment, support staff and site/side marked as required.  Patient was prepped and draped in the usual sterile fashion.    Large Joint Inj: L knee on 09/11/2023 1:05 PM Indications: diagnostic evaluation and pain Details: 22 G 1.5 in needle, superolateral approach  Arthrogram: No  Medications: 88 mg Hyaluronan 88 MG/4ML Outcome: tolerated well, no immediate complications Procedure, treatment alternatives, risks and benefits explained, specific risks discussed. Consent was given by the patient. Immediately prior to procedure a time out was called to verify the correct patient, procedure, equipment, support staff and site/side marked as required. Patient was prepped and draped in the usual sterile fashion.

## 2023-09-23 ENCOUNTER — Ambulatory Visit: Payer: BC Managed Care – PPO | Admitting: Physician Assistant

## 2023-09-23 ENCOUNTER — Telehealth: Payer: Self-pay | Admitting: Orthopaedic Surgery

## 2023-09-23 DIAGNOSIS — M170 Bilateral primary osteoarthritis of knee: Secondary | ICD-10-CM

## 2023-09-23 DIAGNOSIS — M1712 Unilateral primary osteoarthritis, left knee: Secondary | ICD-10-CM

## 2023-09-23 DIAGNOSIS — M1711 Unilateral primary osteoarthritis, right knee: Secondary | ICD-10-CM

## 2023-09-23 MED ORDER — SODIUM HYALURONATE (VISCOSUP) 20 MG/2ML IX SOSY
20.0000 mg | PREFILLED_SYRINGE | INTRA_ARTICULAR | Status: AC | PRN
  Administered 2023-09-23: 20 mg via INTRA_ARTICULAR

## 2023-09-23 NOTE — Telephone Encounter (Signed)
Pt had an appt and need a 7 day return. Pt states Clark said 7 days. Please open appt and call pt at [phone].

## 2023-09-23 NOTE — Progress Notes (Signed)
Office Visit Note   Patient: Barbara Oconnor           Date of Birth: 02-10-1966           MRN: [id number] Visit Date: 09/23/2023              Requested by: Joselyn Arrow, MD [address] [place],  [place] [address] PCP: Joselyn Arrow, MD  Chief Complaint  Patient presents with  . Other    Euflexxa bilat knee 2 of 3      HPI: Patient comes in today for her second Euflexxa injection into her bilateral knees.  She tolerated the first injections without difficulty.  She did have some fullness in her knees but no erythema fever or chills  Assessment & Plan: Visit Diagnoses: Osteoarthritis bilateral knees  Plan: Euflexxa injections were injected without difficulty will follow-up in 1 week for final series  Follow-Up Instructions: Return in about 1 week (around 09/30/2023).   Ortho Exam  Patient is alert, oriented, no adenopathy, well-dressed, normal affect, normal respiratory effort. Bilateral knees no effusion no erythema compartments are soft and compressible she is neurovascularly intact  Imaging: No results found. No images are attached to the encounter.  Labs: No results found for: "HGBA1C", "ESRSEDRATE", "CRP", "LABURIC", "REPTSTATUS", "GRAMSTAIN", "CULT", "LABORGA"   Lab Results  Component Value Date   ALBUMIN 4.8 11/29/2021   ALBUMIN 4.6 12/21/2020   ALBUMIN 4.5 12/23/2019    No results found for: "MG" Lab Results  Component Value Date   VD25OH 59.1 07/04/2020   VD25OH 54 11/30/2015   VD25OH 52 07/23/2013    No results found for: "PREALBUMIN"    Latest Ref Rng & Units 11/29/2021    2:42 PM 07/04/2020   10:08 AM 12/23/2019    2:31 PM  CBC EXTENDED  WBC 3.4 - 10.8 x10E3/uL 5.6  5.1  6.0   RBC 3.77 - 5.28 x10E6/uL 4.78  4.65  4.47   Hemoglobin 11.1 - 15.9 g/dL 56.4  33.2  95.1   HCT 34.0 - 46.6 % 44.7  44.5  41.9   Platelets 150 - 450 x10E3/uL 217  236  246   NEUT# 1.4 - 7.0 x10E3/uL 3.4  3.5  4.8   Lymph# 0.7 - 3.1 x10E3/uL 1.8  1.3  0.7       There is no height or weight on file to calculate BMI.  Orders:  No orders of the defined types were placed in this encounter.  No orders of the defined types were placed in this encounter.    Procedures: Large Joint Inj: bilateral knee on 09/23/2023 1:52 PM Indications: pain and diagnostic evaluation Details: 1.5 in anteromedial approach  Arthrogram: No  Medications (Right): 20 mg Sodium Hyaluronate (Viscosup) 20 MG/2ML Medications (Left): 20 mg Sodium Hyaluronate (Viscosup) 20 MG/2ML Outcome: tolerated well, no immediate complications Procedure, treatment alternatives, risks and benefits explained, specific risks discussed. Consent was given by the patient.    Clinical Data: No additional findings.  ROS:  All other systems negative, except as noted in the HPI. Review of Systems  Objective: Vital Signs: LMP 02/04/2014   Specialty Comments:  No specialty comments available.  PMFS History: Patient Active Problem List   Diagnosis Date Noted  . Unilateral primary osteoarthritis, right knee 10/11/2021  . Numbness and tingling in left hand 07/13/2021  . Chronic anal fissure 02/16/2021  . Ringing in ears 02/16/2021  . Irregular intermenstrual bleeding 08/15/2020  . Fecal occult blood test positive 08/15/2020  . Pain  in left leg 06/24/2018  . Low back pain 10/25/2010   Past Medical History:  Diagnosis Date  . Allergy   . Anal fissure    persistent anal fissure  . Chronic back pain   . Complication of anesthesia    slow awakening many years ago per pt on 09/08/2021  . Goiter 2002   u/s 2002, 2010 (Dr. Sharl Ma)  . H/O motion sickness   . History of chemical exposure 03/07/2020   Pt was exposed to some sort of strong chemical that was used at her workplace to disinfect the bathrooms and other areas. After exposure, she experienced SOB, chest tightness, & cough.  . Knee pain, bilateral 10/12/2020  . Low back pain 10/2010  . Multiple thyroid nodules   . Rectal  bleeding    anal fissure  . Wears contact lenses   . Wears glasses     Family History  Problem Relation Age of Onset  . Breast cancer Mother 58  . Hyperlipidemia Father   . Hypertension Father   . Non-Hodgkin's lymphoma Father 57       stage 4  . Skin cancer Father   . Bipolar disorder Sister   . Atrial fibrillation Sister   . Diabetes Paternal Grandmother   . Dementia Paternal Grandmother   . Heart disease Paternal Grandfather   . Migraines Daughter   . Thyroid cancer Maternal Aunt   . Cancer Paternal Uncle        lymphatic  . Colon cancer Neg Hx   . Colon polyps Neg Hx   . Esophageal cancer Neg Hx   . Rectal cancer Neg Hx   . Stomach cancer Neg Hx     Past Surgical History:  Procedure Laterality Date  . COLONOSCOPY  03/24/2010   Kathryne Sharper); benign polyp, told to repeat 10 years  . COLONOSCOPY  06/07/2021   one cecal polyp - tubular adenoma  . EXAMINATION UNDER ANESTHESIA N/A 11/04/2014   Procedure: EXAM UNDER ANESTHESIA;  Surgeon: Romie Levee, MD;  Location: [facility];  Service: General;  Laterality: N/A;  . FISTULOTOMY N/A 09/14/2021   Procedure: POSSIBLE FISTULOTOMY;  Surgeon: Andria Meuse, MD;  Location: [facility];  Service: General;  Laterality: N/A;  . hemorrhoid banding  2010  . PLACEMENT OF SETON N/A 09/14/2021   Procedure: POSSIBLE PLACEMENT OF SETON;  Surgeon: Andria Meuse, MD;  Location: [facility];  Service: General;  Laterality: N/A;  . RECTAL EXAM UNDER ANESTHESIA N/A 09/14/2021   Procedure: ANORECTAL EXAM UNDER ANESTHESIA;  Surgeon: Andria Meuse, MD;  Location: [facility] [place];  Service: General;  Laterality: N/A;  . SPHINCTEROTOMY N/A 11/04/2014   Procedure: CHEMICAL SPHINCTEROTOMY WITH BOTOX/ fisurectomy;  Surgeon: Romie Levee, MD;  Location: [facility];  Service: General;  Laterality: N/A;  . TEAR DUCT PROBING  09/24/2004   left  .  TONSILLECTOMY  09/24/1984   Social History   Occupational History  . Occupation: teaches pilates  . Occupation: works at [employer])  Tobacco Use  . Smoking status: Never  . Smokeless tobacco: Never  Vaping Use  . Vaping status: Never Used  Substance and Sexual Activity  . Alcohol use: Yes    Comment: 5-7 glasses of wine/week (5 oz red wine)  . Drug use: No  . Sexual activity: Yes    Partners: Male    Birth control/protection: Surgical    Comment: husband had vasectomy

## 2023-09-24 NOTE — Telephone Encounter (Signed)
Scheduled

## 2023-09-27 ENCOUNTER — Other Ambulatory Visit: Payer: Self-pay | Admitting: Family Medicine

## 2023-09-27 DIAGNOSIS — M62830 Muscle spasm of back: Secondary | ICD-10-CM

## 2023-09-30 ENCOUNTER — Encounter: Payer: Self-pay | Admitting: Orthopaedic Surgery

## 2023-09-30 ENCOUNTER — Ambulatory Visit: Payer: BC Managed Care – PPO | Admitting: Orthopaedic Surgery

## 2023-09-30 DIAGNOSIS — M170 Bilateral primary osteoarthritis of knee: Secondary | ICD-10-CM | POA: Diagnosis not present

## 2023-09-30 DIAGNOSIS — M1712 Unilateral primary osteoarthritis, left knee: Secondary | ICD-10-CM

## 2023-09-30 DIAGNOSIS — M1711 Unilateral primary osteoarthritis, right knee: Secondary | ICD-10-CM

## 2023-09-30 MED ORDER — SODIUM HYALURONATE (VISCOSUP) 20 MG/2ML IX SOSY
20.0000 mg | PREFILLED_SYRINGE | INTRA_ARTICULAR | Status: AC | PRN
  Administered 2023-09-30: 20 mg via INTRA_ARTICULAR

## 2023-09-30 NOTE — Progress Notes (Signed)
   Procedure Note  Patient: Barbara Oconnor             Date of Birth: 09/23/1966           MRN: [id number]             Visit Date: 09/30/2023  Procedures: Visit Diagnoses:  1. Unilateral primary osteoarthritis, left knee   2. Unilateral primary osteoarthritis, right knee     Large Joint Inj: R knee on 09/30/2023 1:27 PM Indications: diagnostic evaluation and pain Details: 22 G 1.5 in needle, superolateral approach  Arthrogram: No  Medications: 20 mg Sodium Hyaluronate (Viscosup) 20 MG/2ML Outcome: tolerated well, no immediate complications Procedure, treatment alternatives, risks and benefits explained, specific risks discussed. Consent was given by the patient. Immediately prior to procedure a time out was called to verify the correct patient, procedure, equipment, support staff and site/side marked as required. Patient was prepped and draped in the usual sterile fashion.    Large Joint Inj: L knee on 09/30/2023 1:28 PM Indications: diagnostic evaluation and pain Details: 22 G 1.5 in needle, superolateral approach  Arthrogram: No  Medications: 20 mg Sodium Hyaluronate (Viscosup) 20 MG/2ML Outcome: tolerated well, no immediate complications Procedure, treatment alternatives, risks and benefits explained, specific risks discussed. Consent was given by the patient. Immediately prior to procedure a time out was called to verify the correct patient, procedure, equipment, support staff and site/side marked as required. Patient was prepped and draped in the usual sterile fashion.    The patient is here for injection #3 of a series of 3 hyaluronic acid injections in both knees to treat the pain from osteoarthritis.  She is only 58 years old.  She has had no adverse reactions to injections #1 or 2 in each knee.  This was with Euflexxa.  Neither knee has an effusion today with good range of motion.  There is no redness.  I did place Euflexxa No. 3 in both knees today which she tolerated  well.  If this does not work, the only other options would be considering knee replacement surgery.  She can always have these again in 6 months if needed.  Lot number: [id number]

## 2023-11-26 NOTE — Progress Notes (Unsigned)
 No chief complaint on file.  Barbara Oconnor is a 58 y.o. female who presents for a complete physical.     She had knee injections by ortho in February (Syvisc on R, cortisone on L). Prior injection was 09/2021. L knee is better, still waiting for the R knee to improve. She had a good response to the injection last year.   H/o being overweight--she started Weight [employer] in early 2023. She was down to 152@ at her physical last year, and stated she would like to lose another 10# to have some "wiggle room"  Wt Readings from Last 3 Encounters:  12/05/22 152 lb (68.9 kg)  11/29/21 174 lb (78.9 kg)  09/14/21 184 lb 14.4 oz (83.9 kg)    H/o Wartenberg's syndrome (ongoing numbness of L thumb, poss radial nerve damage related to wearing Apple watch). She is s/p evaluation by Dr. Frazier Butt with EMG/NCV, MRI in nov/dec 2022.  She reports her symptoms finally resolved, and is back to wearing a watch (looser). Mild OA noted in L thumb on MRI (first CMC, MCP, 1st IP joint). She denies pain in the thumb.  She continues on gabapentin for hot flashes (and back pain), along with HRT from GYN. She is currently taking 600mg  qHS. (Initially she took it 3/day, but doing well on 2 at bedtime).   She had some PMB in 08/2022, saw Dr. Henderson Cloud, had [place]. She thinks it was related to a polyp and that she passed a polyp. She denies any further bleeding.   Chronic back pain--She taking 600mg  of gabapentin at night, none during the day, as reported above. Back is about the same (previously reported "fair to midlin").  She has flexeril to use prn. She takes this the night before traveling, helps with her spasms, and prn with pain.  This was refilled 09/2023. She doesn't take any other meds (rare tylenol).  Heat and movement work the best.  She uses tramadol very infrequently--causes some motion sensitivity, uses only when pain is very severe. Last filled 12/17/2018 #40.  She still has some, hasn't used any in a very long  time (with a tooth extraction.) (She previously had 2 cortisone injections for her back/leg pain, reportedly didn't help all that much, pain just changed some. She "can't take ibuprofen"--inflames back more, as does meloxicam.  Aleve constipates her.)    Goiter:  Hasn't seen Dr. Sharl Ma in years. Denies any change in size, trouble swallowing. Denies hair/skin/bowel changes. Energy and moods are good.  Intentional weight loss. Last [place] was in 11/2021: IMPRESSION: 1. Nodule 4 located in the inferior right thyroid lobe demonstrates no significant growth since 2010, consistent with a benign etiology. 2. Nodule 2 located in the mid right thyroid lobe demonstrates very minimal growth since 2010, indicative of benign etiology. 3. Remaining thyroid nodules do not meet criteria for imaging follow-up or FNA.  Lab Results  Component Value Date   TSH 0.767 11/29/2021  (Taking Biotin) She no longer takes biotin   Immunization History  Administered Date(s) Administered   Influenza Split 08/01/2010, 06/15/2011, 07/01/2013, 06/24/2014   Influenza, Quadrivalent, Recombinant, Inj, Pf 08/02/2019   Influenza,inj,Quad PF,6+ Mos 06/01/2015   Influenza-Unspecified 08/25/2017, 07/20/2018   PFIZER(Purple Top)SARS-COV-2 Vaccination 11/29/2019, 12/22/2019   Tdap 04/06/2011, 12/21/2020   Zoster Recombinant(Shingrix) 03/13/2017, 06/03/2017   She didn't have flu shot and declines, also declines further COVID boosters. Last Pap smear: 05/2022, Dr. Henderson Cloud. NILM, negative HR HPV Last mammogram: 05/2023 Last colonoscopy: 05/2021 with Dr. Orvan Falconer.  Tubular adenoma,  7 year f/u recommended Last DEXA: never Dentist: twice yearly Ophtho: yearly Exercise:   Walks 2x/week, up to 3 miles, less recently due to knee pain. She does core exercises and upper body weights (chest press, pull-ups).  Lipids: Lab Results  Component Value Date   CHOL 151 11/29/2021   HDL 56 11/29/2021   LDLCALC 80 11/29/2021   TRIG 77 11/29/2021    CHOLHDL 2.7 11/29/2021   vitamin D-OH 59.1 in 06/2020 (not taking supplements)  Lab Results  Component Value Date   WBC 5.6 11/29/2021   HGB 15.1 11/29/2021   HCT 44.7 11/29/2021   MCV 94 11/29/2021   PLT 217 11/29/2021    PMH, PSH, SH and FH reviewed and updated    ROS: The patient denies anorexia, fever, headaches, vision changes, decreased hearing, ear pain, sore throat, breast concerns, chest pain, palpitations, dizziness, syncope, dyspnea on exertion, cough, swelling, nausea, vomiting, diarrhea, abdominal pain, melena, indigestion/heartburn, hematuria, incontinence, dysuria, vaginal bleeding, discharge, odor or itch, genital lesions, weakness, tremor, suspicious skin lesions, depression, anxiety, abnormal bleeding/bruising, or enlarged lymph nodes.    Menopausal--on HRT, occasional hot flash. +constipation (mainly wide caliber stools)--controlled by Miralax daily.  No significant bleeding recently (improved after fistulotomy.) +chronic low back pain, chronic, stable/controlled Tinnitus, stable/unchanged (not bothered much by any hearing loss) Mild allergies, controlled by OTC antihistamines (uses either cetirizine or claritin prn with good results) Weight    PHYSICAL EXAM:  LMP 02/04/2014   Wt Readings from Last 3 Encounters:  12/05/22 152 lb (68.9 kg)  11/29/21 174 lb (78.9 kg)  09/14/21 184 lb 14.4 oz (83.9 kg)    General Appearance:    Alert, cooperative, appears stated age.    Head:    Normocephalic, without obvious abnormality, atraumatic     Eyes:    PERRL, conjunctiva/corneas clear, EOM's intact, fundi benign     Ears:    Normal TM's and external ear canals     Nose:    No drainage or sinus tenderness  Throat:    Normal mucosa  Neck:    Supple, no lymphadenopathy; thyroid: mild generalized enlargement of thyroid, small distinct nodule palpable on the R, inferiorly, nontender, not significantly larger than last year. no carotid bruit or JVD     Back:     Spine nontender, no muscle spasm. No CVA tenderness. No SI tenderness  Lungs:    Clear to auscultation bilaterally without wheezes, rales or ronchi; respirations unlabored     Chest Wall:    No tenderness or deformity     Heart:    Regular rate and rhythm, S1 and S2 normal, no murmur, rub or gallop     Breast Exam:    Deferred to GYN.    Abdomen:    Soft, non-tender, nondistended, normoactive bowel sounds, no masses, no hepatosplenomegaly     Genitalia:    Deferred to GYN    Rectal:    Deferred to GYN  Extremities:    No clubbing, cyanosis or edema.  Pulses:    2+ and symmetric all extremities     Skin:    Skin color, texture, turgor normal, no rashes.  3 lesions (one is more within the scalp, others at hairline on the right) that appears to be seborrheic; one is flesh colored, one is lightly pigmented (SK). Many moles throughout, none of which stand out as atypical  Lymph nodes:    Cervical, supraclavicular nodes normal     Neurologic:    Normal strength, sensation  and gait; reflexes 2+ and symmetric throughout.                        Psych:   Normal mood, affect, hygiene and grooming  ***UPDATE SKIN at hairline  ASSESSMENT/PLAN:   Guessing she doesn't want flu shot (offer), or COVID. Prevnar-20 is now recommended, if willing  Discussed monthly self breast exams and yearly mammograms; at least 30 minutes of aerobic activity at least 5 days/week, weight-bearing exercise at least 2-3x/wk; proper sunscreen use reviewed; healthy diet, including goals of calcium and vitamin D intake and alcohol recommendations (less than or equal to 1 drink/day) reviewed; regular seatbelt use; changing batteries in smoke detectors.  Immunization recommendations discussed-- yearly flu shots are recommended, she declines. COVID booster discussed/recommended, declined. Colonoscopy recommendations reviewed, UTD. Due again 05/2028. Cont to follow with yearly GYN visits.     F/u 1 year, sooner prn

## 2023-11-26 NOTE — Patient Instructions (Incomplete)
  HEALTH MAINTENANCE RECOMMENDATIONS:  It is recommended that you get at least 30 minutes of aerobic exercise at least 5 days/week (for weight loss, you may need as much as 60-90 minutes). This can be any activity that gets your heart rate up. This can be divided in 10-15 minute intervals if needed, but try and build up your endurance at least once a week.  Weight bearing exercise is also recommended twice weekly.  Eat a healthy diet with lots of vegetables, fruits and fiber.  "Colorful" foods have a lot of vitamins (ie green vegetables, tomatoes, red peppers, etc).  Limit sweet tea, regular sodas and alcoholic beverages, all of which has a lot of calories and sugar.  Up to 1 alcoholic drink daily may be beneficial for women (unless trying to lose weight, watch sugars).  Drink a lot of water.  Calcium recommendations are 1200-1500 mg daily (1500 mg for postmenopausal women or women without ovaries), and vitamin D 1000 IU daily.  This should be obtained from diet and/or supplements (vitamins), and calcium should not be taken all at once, but in divided doses.  Monthly self breast exams and yearly mammograms for women over the age of 74 is recommended.  Sunscreen of at least SPF 30 should be used on all sun-exposed parts of the skin when outside between the hours of 10 am and 4 pm (not just when at beach or pool, but even with exercise, golf, tennis, and yard work!)  Use a sunscreen that says "broad spectrum" so it covers both UVA and UVB rays, and make sure to reapply every 1-2 hours.  Remember to change the batteries in your smoke detectors when changing your clock times in the spring and fall. Carbon monoxide detectors are recommended for your home.  Use your seat belt every time you are in a car, and please drive safely and not be distracted with cell phones and texting while driving.  You can try taking just 300 mg of gapabentin at night and see if you have any worsening of back pain or hot  flashes.  If either are worse, stay at the 600 mg dose. If you notice no difference in either, you can try further weaning down/off. I suspect you may need it for your back, since you are still having significant discomfort at the end of the day or with certain activities.  You are now eligible for a pneumonia vaccine--the recommendation is either Prevnar-20 or the slightly newer Capvaxive.  We carry Prevnar-20 in our office, you can get the other from the pharmacy if you prefer that one after doing your research.   Consider taking vitamin D 1000 IU daily over the winter (when not in [facility]).

## 2023-11-27 ENCOUNTER — Ambulatory Visit (INDEPENDENT_AMBULATORY_CARE_PROVIDER_SITE_OTHER): Payer: BC Managed Care – PPO | Admitting: Family Medicine

## 2023-11-27 ENCOUNTER — Encounter: Payer: Self-pay | Admitting: Family Medicine

## 2023-11-27 VITALS — BP 120/70 | HR 68 | Ht 68.0 in | Wt 158.0 lb

## 2023-11-27 DIAGNOSIS — M545 Low back pain, unspecified: Secondary | ICD-10-CM | POA: Diagnosis not present

## 2023-11-27 DIAGNOSIS — Z5181 Encounter for therapeutic drug level monitoring: Secondary | ICD-10-CM | POA: Diagnosis not present

## 2023-11-27 DIAGNOSIS — G8929 Other chronic pain: Secondary | ICD-10-CM

## 2023-11-27 DIAGNOSIS — Z0000 Encounter for general adult medical examination without abnormal findings: Secondary | ICD-10-CM | POA: Diagnosis not present

## 2023-11-27 DIAGNOSIS — E042 Nontoxic multinodular goiter: Secondary | ICD-10-CM | POA: Diagnosis not present

## 2023-11-27 DIAGNOSIS — Z780 Asymptomatic menopausal state: Secondary | ICD-10-CM

## 2023-11-27 LAB — LIPID PANEL

## 2023-11-27 MED ORDER — GABAPENTIN 300 MG PO CAPS: 600.0000 mg | ORAL_CAPSULE | Freq: Every day | ORAL | 3 refills | Status: AC

## 2023-11-28 ENCOUNTER — Encounter: Payer: Self-pay | Admitting: Family Medicine

## 2023-11-28 LAB — CMP14+EGFR
ALT: 12 IU/L (ref 0–32)
AST: 18 IU/L (ref 0–40)
Albumin: 4.7 g/dL (ref 3.8–4.9)
Alkaline Phosphatase: 67 IU/L (ref 44–121)
BUN/Creatinine Ratio: 11 (ref 9–23)
BUN: 10 mg/dL (ref 6–24)
Bilirubin Total: 0.6 mg/dL (ref 0.0–1.2)
CO2: 22 mmol/L (ref 20–29)
Calcium: 9.2 mg/dL (ref 8.7–10.2)
Chloride: 104 mmol/L (ref 96–106)
Creatinine, Ser: 0.88 mg/dL (ref 0.57–1.00)
Globulin, Total: 2.5 g/dL (ref 1.5–4.5)
Glucose: 93 mg/dL (ref 70–99)
Potassium: 4.2 mmol/L (ref 3.5–5.2)
Sodium: 140 mmol/L (ref 134–144)
Total Protein: 7.2 g/dL (ref 6.0–8.5)
eGFR: 77 mL/min/{1.73_m2} (ref >59)

## 2023-11-28 LAB — LIPID PANEL
Cholesterol, Total: 158 mg/dL (ref 100–199)
HDL: 65 mg/dL (ref >39)
LDL CALC COMMENT:: 2.4 ratio (ref 0.0–4.4)
LDL Chol Calc (NIH): 79 mg/dL (ref 0–99)
Triglycerides: 73 mg/dL (ref 0–149)
VLDL Cholesterol Cal: 14 mg/dL (ref 5–40)

## 2023-11-28 LAB — CBC WITH DIFFERENTIAL/PLATELET
Basophils Absolute: 0 10*3/uL (ref 0.0–0.2)
Basos: 1 %
EOS (ABSOLUTE): 0 10*3/uL (ref 0.0–0.4)
Eos: 1 %
Hematocrit: 46.1 % (ref 34.0–46.6)
Hemoglobin: 15.3 g/dL (ref 11.1–15.9)
Immature Grans (Abs): 0 10*3/uL (ref 0.0–0.1)
Immature Granulocytes: 0 %
Lymphocytes Absolute: 1.7 10*3/uL (ref 0.7–3.1)
Lymphs: 33 %
MCH: 32.3 pg (ref 26.6–33.0)
MCHC: 33.2 g/dL (ref 31.5–35.7)
MCV: 97 fL (ref 79–97)
Monocytes Absolute: 0.3 10*3/uL (ref 0.1–0.9)
Monocytes: 6 %
Neutrophils Absolute: 3.1 10*3/uL (ref 1.4–7.0)
Neutrophils: 59 %
Platelets: 244 10*3/uL (ref 150–450)
RBC: 4.74 x10E6/uL (ref 3.77–5.28)
RDW: 11.7 % (ref 11.7–15.4)
WBC: 5.3 10*3/uL (ref 3.4–10.8)

## 2023-11-28 LAB — TSH: TSH: 0.63 u[IU]/mL (ref 0.450–4.500)

## 2024-02-04 ENCOUNTER — Ambulatory Visit: Payer: Self-pay

## 2024-02-04 NOTE — Telephone Encounter (Signed)
  Chief Complaint: S/s of URI Symptoms: sneezing, ear fullness, sinus pressure pain, mild cough, LGF, fatigue Frequency: Thursday last week Pertinent Negatives: Patient denies Ear pain, persistent cough, sore throat Disposition: [] ED /[] Urgent Care (no appt availability in office) / [] Appointment(In office/virtual)/ []  [facility]/ [x] Home Care/ [] Refused Recommended Disposition /[] [facility]/ []  Follow-up with PCP Additional Notes: Pt reports starting s/s of URI on Thursday last week. Pt has used OTC medications and rest. Pt starting to feel better but still has LGF. Pt will continue to treat at home. Pt would like a call back from clinic tomorrow to check on her.     Copied from CRM [id number]. Topic: Clinical - Red Word Triage >> Feb 04, 2024  4:31 PM Donald Frost wrote: Red Word that prompted transfer to Nurse Triage: The patient called in stating she has had a fever, cough, congestion and sore throat for a few days now. Since it is not going away I will transfer her to E2C2 NT Reason for Disposition  Common cold with no complications  Answer Assessment - Initial Assessment Questions 1. ONSET: "When did the nasal discharge start?"      Thursday last week 2. AMOUNT: "How much discharge is there?"      moderate 3. COUGH: "Do you have a cough?" If Yes, ask: "Describe the color of your sputum" (clear, white, yellow, green)     Cough 4. RESPIRATORY DISTRESS: "Describe your breathing."      No trouble 5. FEVER: "Do you have a fever?" If Yes, ask: "What is your temperature, how was it measured, and when did it start?"     LGF 6. SEVERITY: "Overall, how bad are you feeling right now?" (e.g., doesn't interfere with normal activities, staying home from school/work, staying in bed)      fatigue 7. OTHER SYMPTOMS: "Do you have any other symptoms?" (e.g., sore throat, earache, wheezing, vomiting)     Ear fullness - Sinus HA, cough  Protocols used: Common Cold-A-AH

## 2024-02-05 ENCOUNTER — Encounter: Payer: Self-pay | Admitting: Family Medicine

## 2024-02-10 ENCOUNTER — Encounter: Payer: Self-pay | Admitting: Medical

## 2024-02-10 ENCOUNTER — Ambulatory Visit: Admitting: Medical

## 2024-02-10 VITALS — BP 118/84 | HR 83 | Temp 100.0°F | Wt 161.6 lb

## 2024-02-10 DIAGNOSIS — N951 Menopausal and female climacteric states: Secondary | ICD-10-CM

## 2024-02-10 DIAGNOSIS — J3489 Other specified disorders of nose and nasal sinuses: Secondary | ICD-10-CM | POA: Diagnosis not present

## 2024-02-10 DIAGNOSIS — R059 Cough, unspecified: Secondary | ICD-10-CM | POA: Diagnosis not present

## 2024-02-10 DIAGNOSIS — J988 Other specified respiratory disorders: Secondary | ICD-10-CM | POA: Diagnosis not present

## 2024-02-10 MED ORDER — AZITHROMYCIN 250 MG PO TABS: ORAL_TABLET | ORAL | 0 refills | Status: DC

## 2024-02-10 MED ORDER — GABAPENTIN 100 MG PO CAPS: 200.0000 mg | ORAL_CAPSULE | Freq: Every day | ORAL | 0 refills | Status: AC

## 2024-02-10 NOTE — Progress Notes (Signed)
 Subjective:  Barbara Oconnor is a 58 y.o. female who presents for Chief Complaint  Patient presents with   Acute Visit    Sinus, fever, pain, pressure in ears and head, congestion, headache, sore throat, head congestion going on 8 days. Taking equate maximum strength severe allergy plus sinus headache. Husband had the same dx before she stated hasn't been seen my a provider.      Here for illness.  She notes fever about most of the last week.   Has had at least 8 days of feeling ill.   Initially felt off 2 weeks, but the last 8 days worse.   She notes sinus pressure, post nasal drainage, coughing, headache, head congestion.  Having lots of cough.  Has had quite a bit of fatigue.  Got it from husband, and is not completley back to normal either.  Been using bone broth, vitamins, rest.  Has used some benadryl,OTC severe allergy plus sinus.   Has missed 6 days of work.  Has felt a little wheeze with cough, sometimes light headed and dizzy.    Nonsmoker.   She and husband did covid tests, all negative.    Some antibiotic don't agree with her.  Doesn't do well on steroid.   Was using gabapentin  for a while for hot flashes, was on 600mg  at one point but was worried about memory so cut down on this.  Lately though hot flashes worse.   Wanted to go back on lower dose for now and consider other hot flash symptoms   No other aggravating or relieving factors.    No other c/o.  Past Medical History:  Diagnosis Date   Allergy    Anal fissure    persistent anal fissure   Chronic back pain    Complication of anesthesia    slow awakening many years ago per pt on 09/08/2021   Goiter 2002   u/s 2002, 2010 (Dr. Kathyanne Parkers)   H/O motion sickness    History of chemical exposure 03/07/2020   Pt was exposed to some sort of strong chemical that was used at her workplace to disinfect the bathrooms and other areas. After exposure, she experienced SOB, chest tightness, & cough.   Knee pain, bilateral 10/12/2020    Low back pain 10/2010   Multiple thyroid  nodules    Rectal bleeding    anal fissure   Wears contact lenses    Wears glasses    Current Outpatient Medications on File Prior to Visit  Medication Sig Dispense Refill   cetirizine (ZYRTEC) 10 MG tablet Take 10 mg by mouth daily.     cholecalciferol (VITAMIN D3) 25 MCG (1000 UNIT) tablet Take 1,000 Units by mouth daily.     norethindrone-ethinyl estradiol (FEMHRT 1/5) 1-5 MG-MCG TABS tablet Take by mouth daily.     polyethylene glycol (MIRALAX / GLYCOLAX) packet Take 15 g by mouth daily.     cyclobenzaprine  (FLEXERIL ) 5 MG tablet TAKE 1-2 TABLETS BY MOUTH 3 TIMES DAILY AS NEEDED FOR MUSCLE SPASMS. (Patient not taking: Reported on 02/10/2024) 40 tablet 0   gabapentin  (NEURONTIN ) 300 MG capsule Take 2 capsules (600 mg total) by mouth at bedtime. (Patient not taking: Reported on 02/10/2024) 180 capsule 3   No current facility-administered medications on file prior to visit.    The following portions of the patient's history were reviewed and updated as appropriate: allergies, current medications, past family history, past medical history, past social history, past surgical history and problem list.  ROS  Otherwise as in subjective above    Objective: BP 118/84   Pulse 83   Temp 100 F (37.8 C)   Wt 161 lb 9.6 oz (73.3 kg)   LMP 02/04/2014   SpO2 95%   BMI 24.57 kg/m   General appearance: alert, no distress, well developed, well nourished HEENT: normocephalic, sclerae anicteric, conjunctiva pink and moist, TMs pearly, nares patent, no discharge, +erythema, pharynx with erythema and post nasal drainage Oral cavity: MMM, no lesions Neck: supple, no lymphadenopathy, no thyromegaly, no masses Heart: RRR, normal S1, S2, no murmurs Lungs: CTA bilaterally, no wheezes, rhonchi, or rales    Assessment: Encounter Diagnoses  Name Primary?   Respiratory tract infection Yes   Sinus pressure    Cough, unspecified type    Menopausal hot  flushes      Plan: Advised rest, hydration, can continue benadryl, and begin round of zpak.   She declines other cough suppressant today  Menopausal hot flashes - she will start back on lower dose gabapentin  100-200mg  daily at bedtime.  Consider other options if she chooses to not continue gabapentin  as she is concerned about memory, adverse long term effects of gabapentin .    [name] was seen today for acute visit.  Diagnoses and all orders for this visit:  Respiratory tract infection  Sinus pressure  Cough, unspecified type  Menopausal hot flushes  Other orders -     azithromycin  (ZITHROMAX ) 250 MG tablet; 2 tablets day 1, then 1 tablet days 2-4 -     gabapentin  (NEURONTIN ) 100 MG capsule; Take 2 capsules (200 mg total) by mouth at bedtime.    Follow up: with Dr. Monnie Anthony in a month

## 2024-03-08 ENCOUNTER — Other Ambulatory Visit: Payer: Self-pay | Admitting: Medical

## 2024-03-09 NOTE — Telephone Encounter (Signed)
 Barbara Oconnor had lowered the dose when he saw her in May. See how she is doing on the 200 mg at bedtime dosing. If doing well, can write for 90d supply with refills until next physical.

## 2024-03-09 NOTE — Telephone Encounter (Signed)
 Patient not taking, will call if she resumes.

## 2024-03-12 DIAGNOSIS — N951 Menopausal and female climacteric states: Secondary | ICD-10-CM | POA: Diagnosis not present

## 2024-06-16 ENCOUNTER — Other Ambulatory Visit: Payer: Self-pay | Admitting: Family Medicine

## 2024-06-16 DIAGNOSIS — Z01419 Encounter for gynecological examination (general) (routine) without abnormal findings: Secondary | ICD-10-CM | POA: Diagnosis not present

## 2024-06-16 DIAGNOSIS — Z1231 Encounter for screening mammogram for malignant neoplasm of breast: Secondary | ICD-10-CM | POA: Diagnosis not present

## 2024-06-16 DIAGNOSIS — M62830 Muscle spasm of back: Secondary | ICD-10-CM

## 2024-06-16 LAB — HM MAMMOGRAPHY

## 2024-06-16 NOTE — Telephone Encounter (Signed)
 Is this okay to refill?

## 2024-07-14 DIAGNOSIS — F4323 Adjustment disorder with mixed anxiety and depressed mood: Secondary | ICD-10-CM | POA: Diagnosis not present

## 2024-07-15 ENCOUNTER — Encounter: Payer: Self-pay | Admitting: Family Medicine

## 2024-07-16 ENCOUNTER — Telehealth (INDEPENDENT_AMBULATORY_CARE_PROVIDER_SITE_OTHER): Admitting: Family Medicine

## 2024-07-16 ENCOUNTER — Encounter: Payer: Self-pay | Admitting: Family Medicine

## 2024-07-16 VITALS — BP 120/80 | Temp 98.8°F | Ht 68.0 in | Wt 157.0 lb

## 2024-07-16 DIAGNOSIS — H938X1 Other specified disorders of right ear: Secondary | ICD-10-CM

## 2024-07-16 DIAGNOSIS — N83201 Unspecified ovarian cyst, right side: Secondary | ICD-10-CM | POA: Diagnosis not present

## 2024-07-16 DIAGNOSIS — U071 COVID-19: Secondary | ICD-10-CM | POA: Diagnosis not present

## 2024-07-16 DIAGNOSIS — N950 Postmenopausal bleeding: Secondary | ICD-10-CM | POA: Diagnosis not present

## 2024-07-16 NOTE — Patient Instructions (Signed)
 Please stay well hydrated. Continue sinus rinses, up to twice daily as needed. Be sure to use distilled or boiled water, not tap water. Continue decongestants as needed. You can continue with the short-acting combo that you have, vs switch to pseudoephedrine (behind the counter), and consider 12 hours, so that it isn't wearing off mid-day.  The decongestant should help with your sinus pressure and also with the ear pressure. If you have worsening ear pain, or fever, let us  take a look at it. Continue tylenol  or ibuprofen if needed for pain. If you notice thicker mucus or phlegm, or you start coughing, then start taking Mucinex DM. You may return to work since you are no longer having fever or significant respiratory symptoms.

## 2024-07-16 NOTE — Progress Notes (Signed)
 Start time: 1:45 End time: 2:15  Virtual Visit via Video Note  I connected with Barbara Oconnor on 07/16/24 by a video enabled telemedicine application and verified that I am speaking with the correct person using two identifiers.  Location: Patient: home Provider: office   I discussed the limitations of evaluation and management by telemedicine and the availability of in person appointments. The patient expressed understanding and agreed to proceed.  History of Present Illness:  Chief Complaint  Patient presents with   Covid Positive    [facility] tested positive for covid 07/07/24. Still having some ear pressure ans sinus pressure. Needs note for work. Went home early 10/14. Went in for a few hours yesterday. Did not go in today.    Husband had COVID. She tested + on 10/14, prior to even having symptoms. Friday 10/17 she started with some sinus headache, congestion, sneezing, ear pressure (mainly on R). Had low grade fever of 99.8 once.  Some body aches, especially in neck and shoulders. She has only had a mild cough, mainly in the morning.  Last night she had sinus pressure and R ear ache, which affected her sleep. Last night pressure was mainly on the R side of her head, but can be on both sides. Nasal drainage is clear.  Sinus rinses are helpful, not getting discolored mucus, and helps with the pressure.  She has been taking decongestant/antihistamine/tylenol  combo, which helps. Last dose was 1 pill this morning. She went to work for a couple of hours yesterday.  Felt more fatigued and a little shaky after being at work for a few hours. She has felt a little shaky related to the decongestant.  Yesterday afternoon (when at work) she felt shaky, med had worn off.  Overall she is feeling better. She has some persistent sinus pressure, fatigue, and pain across the sides of her neck. Doesn't feel enlarged lymph nodes.  Denies any significant cough, shortness of breath   PMH, PSH,  SH reviewed  Outpatient Encounter Medications as of 07/16/2024  Medication Sig Note   cetirizine (ZYRTEC) 10 MG tablet Take 10 mg by mouth daily. 07/16/2024: As needed   CREATINE PO Take 5 mLs by mouth daily.    cyclobenzaprine  (FLEXERIL ) 5 MG tablet TAKE 1-2 TABLETS BY MOUTH 3 TIMES DAILY AS NEEDED FOR MUSCLE SPASMS. 07/16/2024: As needed   diphenhydrAMINE-PE-APAP (EQ FLU & SEVERE COLD & COUGH PO) Take 1 tablet by mouth every 4 (four) hours. 07/16/2024: Took this am (antihistamine, decongestant and tylenol )   estradiol (ESTRACE) 0.5 MG tablet Take 0.5 mg by mouth daily.    gabapentin  (NEURONTIN ) 300 MG capsule Take 2 capsules (600 mg total) by mouth at bedtime. 07/16/2024: Takes one as needed qhs   Multiple Vitamins-Minerals (WOMENS 50+ MULTI VITAMIN PO) Take 1 tablet by mouth daily.    norethindrone-ethinyl estradiol (FEMHRT 1/5) 1-5 MG-MCG TABS tablet Take by mouth daily.    polyethylene glycol (MIRALAX / GLYCOLAX) packet Take 15 g by mouth daily.    gabapentin  (NEURONTIN ) 100 MG capsule Take 2 capsules (200 mg total) by mouth at bedtime. (Patient not taking: Reported on 07/16/2024)    [DISCONTINUED] azithromycin  (ZITHROMAX ) 250 MG tablet 2 tablets day 1, then 1 tablet days 2-4    [DISCONTINUED] cholecalciferol (VITAMIN D3) 25 MCG (1000 UNIT) tablet Take 1,000 Units by mouth daily. 11/27/2023: Takes it sporadically (last took it for a few days a couple of months ago).   No facility-administered encounter medications on file as of 07/16/2024.  Allergies  Allergen Reactions   Aspirin    Ibuprofen Other (See Comments)   Wound Dressing Adhesive Rash    ROS: no further fever (only had LG).  Sinus pressure and R ear pressure per HPI.  No chest pain, shortness of breath, n/v/d, rash, bleeding, bruising, or other concerns. +Fatigue. See HPI    Observations/Objective:  BP 120/80   Temp 98.8 F (37.1 C) (Oral)   Ht 5' 8 (1.727 m)   Wt 157 lb (71.2 kg)   LMP 02/04/2014   BMI 23.87 kg/m    Pleasant, well-appearing female, in no distress. No coughing or throat clearing. Once got up and walked across the room, in no distress. She is alert and oriented Cranial nerves grossly intact. Normal mood, grooming, eye contact and speech.    Assessment and Plan:  COVID-19 virus infection - supportive measures reviewed, s/sx for which in-person evaluation is recommended.  Work note written (didn't require RTW date, just that she was seen)  Ear pressure, right - suspect related to ETD, given her illness and congestion. Rec use of decongestant prn. f/u in office if persistent/worsening ear pain, hearing decline, fever  Please stay well hydrated. Continue sinus rinses, up to twice daily as needed. Be sure to use distilled or boiled water, not tap water. Continue decongestants as needed. You can continue with the short-acting combo that you have, vs switch to pseudoephedrine (behind the counter), and consider 12 hours, so that it isn't wearing off mid-day.  The decongestant should help with your sinus pressure and also with the ear pressure. If you have worsening ear pain, or fever, let us  take a look at it. Continue tylenol  or ibuprofen if needed for pain. If you notice thicker mucus or phlegm, or you start coughing, then start taking Mucinex DM. You may return to work since you are no longer having fever or significant respiratory symptoms.  Note for work needed, stating she was seen today for illness.  Does NOT need RTW date, just that she was seen. Note written   Follow Up Instructions:    I discussed the assessment and treatment plan with the patient. The patient was provided an opportunity to ask questions and all were answered. The patient agreed with the plan and demonstrated an understanding of the instructions.   The patient was advised to call back or seek an in-person evaluation if the symptoms worsen or if the condition fails to improve as anticipated.  I spent 32  minutes dedicated to the care of this patient, including pre-visit review of records, face to face time, post-visit ordering of testing and documentation.    Annabelle DELENA Fetters, MD

## 2024-07-22 ENCOUNTER — Telehealth: Payer: Self-pay | Admitting: Family Medicine

## 2024-07-22 NOTE — Telephone Encounter (Signed)
 Copied from CRM #[id number]. Topic: General - Other >> Jul 22, 2024 10:16 AM Hadassah PARAS wrote: Reason for CRM: Ronal from [employer] is requesting a release letter containing dates for her to return back to work. Please email [email or web address]  Dates pt left [facility] office 10/14 came to our office 10/23  Please advise [phone] ext [phone]

## 2024-07-22 NOTE — Telephone Encounter (Signed)
 Okay for letter stating--  Patient had been out of work since 10/14. She was seen virtually for visit on 06/2022. She was advised at that visit that there were no restrictions to returning to work.  Please contact our office with further questions.

## 2024-07-23 ENCOUNTER — Encounter: Payer: Self-pay | Admitting: Family Medicine

## 2024-07-23 NOTE — Telephone Encounter (Signed)
 Done and emailed to patient, per her request.

## 2024-07-27 ENCOUNTER — Encounter: Payer: Self-pay | Admitting: Radiology

## 2024-07-29 DIAGNOSIS — F5104 Psychophysiologic insomnia: Secondary | ICD-10-CM | POA: Diagnosis not present

## 2024-08-05 DIAGNOSIS — F4323 Adjustment disorder with mixed anxiety and depressed mood: Secondary | ICD-10-CM | POA: Diagnosis not present

## 2024-09-30 ENCOUNTER — Other Ambulatory Visit: Payer: Self-pay | Admitting: Family Medicine

## 2024-09-30 DIAGNOSIS — M62830 Muscle spasm of back: Secondary | ICD-10-CM

## 2024-10-01 NOTE — Telephone Encounter (Signed)
 Is this okay to refill?

## 2024-12-02 ENCOUNTER — Encounter: Payer: Self-pay | Admitting: Family Medicine
